# Patient Record
Sex: Female | Born: 1960 | Race: White | Hispanic: No | Smoking: Current every day smoker
Health system: Southern US, Community
[De-identification: ages and names within clinical notes are randomized; demographics above are authoritative.]

## PROBLEM LIST (undated history)

## (undated) DIAGNOSIS — I10 Essential (primary) hypertension: Secondary | ICD-10-CM

## (undated) DIAGNOSIS — C801 Malignant (primary) neoplasm, unspecified: Secondary | ICD-10-CM

## (undated) DIAGNOSIS — I639 Cerebral infarction, unspecified: Secondary | ICD-10-CM

## (undated) DIAGNOSIS — J449 Chronic obstructive pulmonary disease, unspecified: Secondary | ICD-10-CM

## (undated) DIAGNOSIS — E785 Hyperlipidemia, unspecified: Secondary | ICD-10-CM

## (undated) DIAGNOSIS — I6529 Occlusion and stenosis of unspecified carotid artery: Secondary | ICD-10-CM

## (undated) DIAGNOSIS — E039 Hypothyroidism, unspecified: Secondary | ICD-10-CM

## (undated) DIAGNOSIS — E119 Type 2 diabetes mellitus without complications: Secondary | ICD-10-CM

## (undated) DIAGNOSIS — E079 Disorder of thyroid, unspecified: Secondary | ICD-10-CM

## (undated) DIAGNOSIS — G43909 Migraine, unspecified, not intractable, without status migrainosus: Secondary | ICD-10-CM

## (undated) DIAGNOSIS — G4700 Insomnia, unspecified: Secondary | ICD-10-CM

## (undated) HISTORY — PX: BACK SURGERY: SHX140

## (undated) HISTORY — DX: Insomnia, unspecified: G47.00

## (undated) HISTORY — DX: Hyperlipidemia, unspecified: E78.5

## (undated) HISTORY — DX: Occlusion and stenosis of unspecified carotid artery: I65.29

## (undated) HISTORY — DX: Hypothyroidism, unspecified: E03.9

## (undated) HISTORY — DX: Migraine, unspecified, not intractable, without status migrainosus: G43.909

## (undated) HISTORY — PX: SPINAL CORD STIMULATOR IMPLANT: SHX2422

## (undated) HISTORY — PX: CAROTID ENDARTERECTOMY: SUR193

---
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
## 1990-04-29 HISTORY — PX: TOTAL THYROIDECTOMY: SHX2547

## 2000-03-08 ENCOUNTER — Encounter: Payer: Self-pay | Admitting: Neurosurgery

## 2000-03-08 ENCOUNTER — Ambulatory Visit (HOSPITAL_COMMUNITY): Admission: RE | Admit: 2000-03-08 | Discharge: 2000-03-08 | Payer: Self-pay | Admitting: Neurosurgery

## 2000-03-18 ENCOUNTER — Ambulatory Visit (HOSPITAL_COMMUNITY): Admission: RE | Admit: 2000-03-18 | Discharge: 2000-03-18 | Payer: Self-pay | Admitting: Neurosurgery

## 2000-03-18 ENCOUNTER — Encounter: Payer: Self-pay | Admitting: Neurosurgery

## 2000-04-01 ENCOUNTER — Ambulatory Visit (HOSPITAL_COMMUNITY): Admission: RE | Admit: 2000-04-01 | Discharge: 2000-04-01 | Payer: Self-pay | Admitting: Neurosurgery

## 2000-04-01 ENCOUNTER — Encounter: Payer: Self-pay | Admitting: Neurosurgery

## 2000-04-04 ENCOUNTER — Encounter: Admission: RE | Admit: 2000-04-04 | Discharge: 2000-04-04 | Payer: Self-pay | Admitting: Family Medicine

## 2000-04-04 ENCOUNTER — Encounter: Payer: Self-pay | Admitting: Family Medicine

## 2000-09-01 ENCOUNTER — Encounter: Payer: Self-pay | Admitting: Neurosurgery

## 2000-09-01 ENCOUNTER — Ambulatory Visit (HOSPITAL_COMMUNITY): Admission: RE | Admit: 2000-09-01 | Discharge: 2000-09-01 | Payer: Self-pay | Admitting: Neurosurgery

## 2001-07-21 ENCOUNTER — Other Ambulatory Visit: Admission: RE | Admit: 2001-07-21 | Discharge: 2001-07-21 | Payer: Self-pay | Admitting: Family Medicine

## 2001-10-15 ENCOUNTER — Encounter: Payer: Self-pay | Admitting: Neurosurgery

## 2001-10-15 ENCOUNTER — Encounter: Admission: RE | Admit: 2001-10-15 | Discharge: 2001-10-15 | Payer: Self-pay | Admitting: Neurosurgery

## 2001-10-29 ENCOUNTER — Encounter: Payer: Self-pay | Admitting: Neurosurgery

## 2001-10-29 ENCOUNTER — Encounter: Admission: RE | Admit: 2001-10-29 | Discharge: 2001-10-29 | Payer: Self-pay | Admitting: Neurosurgery

## 2001-11-12 ENCOUNTER — Encounter: Payer: Self-pay | Admitting: Neurosurgery

## 2001-11-12 ENCOUNTER — Encounter: Admission: RE | Admit: 2001-11-12 | Discharge: 2001-11-12 | Payer: Self-pay | Admitting: Neurosurgery

## 2003-09-07 ENCOUNTER — Other Ambulatory Visit: Admission: RE | Admit: 2003-09-07 | Discharge: 2003-09-07 | Payer: Self-pay | Admitting: Family Medicine

## 2005-04-29 HISTORY — PX: CHOLECYSTECTOMY: SHX55

## 2006-04-29 HISTORY — PX: OOPHORECTOMY: SHX86

## 2008-04-29 HISTORY — PX: BACK SURGERY: SHX140

## 2008-09-05 ENCOUNTER — Ambulatory Visit (HOSPITAL_COMMUNITY): Admission: RE | Admit: 2008-09-05 | Discharge: 2008-09-06 | Payer: Self-pay | Admitting: Neurosurgery

## 2009-03-01 ENCOUNTER — Ambulatory Visit (HOSPITAL_COMMUNITY): Admission: RE | Admit: 2009-03-01 | Discharge: 2009-03-02 | Payer: Self-pay | Admitting: Neurosurgery

## 2009-09-11 ENCOUNTER — Ambulatory Visit (HOSPITAL_COMMUNITY): Admission: RE | Admit: 2009-09-11 | Discharge: 2009-09-11 | Payer: Self-pay | Admitting: Anesthesiology

## 2009-10-17 ENCOUNTER — Inpatient Hospital Stay (HOSPITAL_COMMUNITY): Admission: RE | Admit: 2009-10-17 | Discharge: 2009-10-18 | Payer: Self-pay | Admitting: Orthopedic Surgery

## 2010-05-20 ENCOUNTER — Encounter: Payer: Self-pay | Admitting: Neurosurgery

## 2010-07-15 LAB — URINALYSIS, ROUTINE W REFLEX MICROSCOPIC
Bilirubin Urine: NEGATIVE
Glucose, UA: 250 mg/dL — AB
Hgb urine dipstick: NEGATIVE
Ketones, ur: NEGATIVE mg/dL
Nitrite: NEGATIVE
Protein, ur: NEGATIVE mg/dL
Specific Gravity, Urine: 1.027 (ref 1.005–1.030)
Urobilinogen, UA: 0.2 mg/dL (ref 0.0–1.0)
pH: 5 (ref 5.0–8.0)

## 2010-07-15 LAB — BASIC METABOLIC PANEL
BUN: 5 mg/dL — ABNORMAL LOW (ref 6–23)
CO2: 29 mEq/L (ref 19–32)
Calcium: 8.5 mg/dL (ref 8.4–10.5)
Chloride: 101 mEq/L (ref 96–112)
Creatinine, Ser: 0.79 mg/dL (ref 0.4–1.2)
GFR calc Af Amer: >60 mL/min (ref >60)
GFR calc non Af Amer: >60 mL/min (ref >60)
Glucose, Bld: 156 mg/dL — ABNORMAL HIGH (ref 70–99)
Potassium: 3.9 mEq/L (ref 3.5–5.1)
Sodium: 136 mEq/L (ref 135–145)

## 2010-07-15 LAB — DIFFERENTIAL
Basophils Absolute: 0.1 10*3/uL (ref 0.0–0.1)
Basophils Relative: 1 % (ref 0–1)
Eosinophils Absolute: 0.1 10*3/uL (ref 0.0–0.7)
Eosinophils Relative: 1 % (ref 0–5)
Lymphocytes Relative: 37 % (ref 12–46)
Lymphs Abs: 3.4 10*3/uL (ref 0.7–4.0)
Monocytes Absolute: 0.5 10*3/uL (ref 0.1–1.0)
Monocytes Relative: 6 % (ref 3–12)
Neutro Abs: 4.9 10*3/uL (ref 1.7–7.7)
Neutrophils Relative %: 55 % (ref 43–77)

## 2010-07-15 LAB — GLUCOSE, CAPILLARY
Glucose-Capillary: 115 mg/dL — ABNORMAL HIGH (ref 70–99)
Glucose-Capillary: 121 mg/dL — ABNORMAL HIGH (ref 70–99)
Glucose-Capillary: 144 mg/dL — ABNORMAL HIGH (ref 70–99)
Glucose-Capillary: 156 mg/dL — ABNORMAL HIGH (ref 70–99)
Glucose-Capillary: 173 mg/dL — ABNORMAL HIGH (ref 70–99)
Glucose-Capillary: 191 mg/dL — ABNORMAL HIGH (ref 70–99)

## 2010-07-15 LAB — COMPREHENSIVE METABOLIC PANEL
ALT: 36 U/L — ABNORMAL HIGH (ref 0–35)
AST: 26 U/L (ref 0–37)
Albumin: 4.4 g/dL (ref 3.5–5.2)
Alkaline Phosphatase: 43 U/L (ref 39–117)
BUN: 15 mg/dL (ref 6–23)
CO2: 25 mEq/L (ref 19–32)
Calcium: 9.7 mg/dL (ref 8.4–10.5)
Chloride: 103 mEq/L (ref 96–112)
Creatinine, Ser: 0.82 mg/dL (ref 0.4–1.2)
GFR calc Af Amer: >60 mL/min (ref >60)
GFR calc non Af Amer: >60 mL/min (ref >60)
Glucose, Bld: 258 mg/dL — ABNORMAL HIGH (ref 70–99)
Potassium: 4.3 mEq/L (ref 3.5–5.1)
Sodium: 138 mEq/L (ref 135–145)
Total Bilirubin: 0.3 mg/dL (ref 0.3–1.2)
Total Protein: 7.2 g/dL (ref 6.0–8.3)

## 2010-07-15 LAB — CBC
HCT: 34.8 % — ABNORMAL LOW (ref 36.0–46.0)
HCT: 40.3 % (ref 36.0–46.0)
Hemoglobin: 12.1 g/dL (ref 12.0–15.0)
Hemoglobin: 13.8 g/dL (ref 12.0–15.0)
MCHC: 34.3 g/dL (ref 30.0–36.0)
MCHC: 34.8 g/dL (ref 30.0–36.0)
MCV: 85.9 fL (ref 78.0–100.0)
MCV: 86.2 fL (ref 78.0–100.0)
Platelets: 188 10*3/uL (ref 150–400)
Platelets: 239 10*3/uL (ref 150–400)
RBC: 4.03 MIL/uL (ref 3.87–5.11)
RBC: 4.7 MIL/uL (ref 3.87–5.11)
RDW: 14.3 % (ref 11.5–15.5)
RDW: 14.7 % (ref 11.5–15.5)
WBC: 10.1 10*3/uL (ref 4.0–10.5)
WBC: 9 10*3/uL (ref 4.0–10.5)

## 2010-07-15 LAB — TYPE AND SCREEN
ABO/RH(D): A POS
Antibody Screen: NEGATIVE

## 2010-07-15 LAB — ABO/RH: ABO/RH(D): A POS

## 2010-07-15 LAB — APTT: aPTT: 27 seconds (ref 24–37)

## 2010-07-15 LAB — PROTIME-INR
INR: 0.92 (ref 0.00–1.49)
Prothrombin Time: 12.3 seconds (ref 11.6–15.2)

## 2010-08-01 LAB — GLUCOSE, CAPILLARY
Glucose-Capillary: 110 mg/dL — ABNORMAL HIGH (ref 70–99)
Glucose-Capillary: 120 mg/dL — ABNORMAL HIGH (ref 70–99)
Glucose-Capillary: 120 mg/dL — ABNORMAL HIGH (ref 70–99)
Glucose-Capillary: 143 mg/dL — ABNORMAL HIGH (ref 70–99)
Glucose-Capillary: 148 mg/dL — ABNORMAL HIGH (ref 70–99)
Glucose-Capillary: 150 mg/dL — ABNORMAL HIGH (ref 70–99)
Glucose-Capillary: 78 mg/dL (ref 70–99)

## 2010-08-02 LAB — BASIC METABOLIC PANEL
BUN: 7 mg/dL (ref 6–23)
CO2: 27 mEq/L (ref 19–32)
Calcium: 9.8 mg/dL (ref 8.4–10.5)
Chloride: 108 mEq/L (ref 96–112)
Creatinine, Ser: 0.72 mg/dL (ref 0.4–1.2)
GFR calc Af Amer: >60 mL/min (ref >60)
GFR calc non Af Amer: >60 mL/min (ref >60)
Glucose, Bld: 94 mg/dL (ref 70–99)
Potassium: 4.5 mEq/L (ref 3.5–5.1)
Sodium: 141 mEq/L (ref 135–145)

## 2010-08-02 LAB — CBC
HCT: 39 % (ref 36.0–46.0)
Hemoglobin: 13.5 g/dL (ref 12.0–15.0)
MCHC: 34.7 g/dL (ref 30.0–36.0)
MCV: 87.6 fL (ref 78.0–100.0)
Platelets: 291 10*3/uL (ref 150–400)
RBC: 4.45 MIL/uL (ref 3.87–5.11)
RDW: 14.7 % (ref 11.5–15.5)
WBC: 8.9 10*3/uL (ref 4.0–10.5)

## 2010-08-07 LAB — GLUCOSE, CAPILLARY
Glucose-Capillary: 120 mg/dL — ABNORMAL HIGH (ref 70–99)
Glucose-Capillary: 122 mg/dL — ABNORMAL HIGH (ref 70–99)
Glucose-Capillary: 132 mg/dL — ABNORMAL HIGH (ref 70–99)
Glucose-Capillary: 88 mg/dL (ref 70–99)
Glucose-Capillary: 92 mg/dL (ref 70–99)

## 2010-08-07 LAB — BASIC METABOLIC PANEL
BUN: 12 mg/dL (ref 6–23)
CO2: 28 mEq/L (ref 19–32)
Calcium: 10.7 mg/dL — ABNORMAL HIGH (ref 8.4–10.5)
Chloride: 103 mEq/L (ref 96–112)
Creatinine, Ser: 1.03 mg/dL (ref 0.4–1.2)
GFR calc Af Amer: >60 mL/min (ref >60)
GFR calc non Af Amer: 57 mL/min — ABNORMAL LOW (ref >60)
Glucose, Bld: 145 mg/dL — ABNORMAL HIGH (ref 70–99)
Potassium: 4.8 mEq/L (ref 3.5–5.1)
Sodium: 141 mEq/L (ref 135–145)

## 2010-08-07 LAB — CBC
HCT: 43.4 % (ref 36.0–46.0)
Hemoglobin: 15.1 g/dL — ABNORMAL HIGH (ref 12.0–15.0)
MCHC: 34.7 g/dL (ref 30.0–36.0)
MCV: 86.9 fL (ref 78.0–100.0)
Platelets: 331 10*3/uL (ref 150–400)
RBC: 4.99 MIL/uL (ref 3.87–5.11)
RDW: 15.3 % (ref 11.5–15.5)
WBC: 11.1 10*3/uL — ABNORMAL HIGH (ref 4.0–10.5)

## 2010-09-11 NOTE — Op Note (Signed)
NAMEADELFA, Terri Li               ACCOUNT NO.:  [id number]   MEDICAL RECORD NO.:  [id number]          PATIENT TYPE:  OIB   LOCATION:  3536                         FACILITY:  [facility]   PHYSICIAN:  Cristi Loron, M.D.DATE OF BIRTH:  08-Oct-1960   DATE OF PROCEDURE:  09/05/2008  DATE OF DISCHARGE:  09/06/2008                               OPERATIVE REPORT   BRIEF HISTORY:  The patient is a 50 year old white female who was  injured in a work-related accident.  She failed medical management, was  worked up with a lumbar MRI, which demonstrated the patient had  herniated disk at L5-S1.  The symptoms seemed consistent with a right S1  radiculopathy.  I discussed the various treatment options with the  patient including surgery.  She has weighed the risks, benefits, and  alternatives of surgery and desired to proceed with the right L5-S1  diskectomy.   PREOPERATIVE DIAGNOSES:  Right L5-S1 herniated nucleus pulposus,  lumbago, lumbar radiculopathy.   POSTOPERATIVE DIAGNOSES:  Right L5-S1 herniated nucleus pulposus,  lumbago, lumbar radiculopathy.   PROCEDURE:  Right L5-S1 diskectomy using microdissection.   SURGEON:  Cristi Loron, MD   ASSISTANT:  None.   ANESTHESIA:  General endotracheal.   ESTIMATED BLOOD LOSS:  25 mL.   SPECIMENS:  None.   DRAINS:  None.   COMPLICATIONS:  None.   DESCRIPTION OF THE PROCEDURE:  The patient was brought to the operating  room by the Anesthesia team.  General endotracheal anesthesia was  induced.  The patient was turned to the prone position on Wilson frame.  Her lumbosacral region was then prepared with Betadine scrub and  Betadine solution.  Sterile drapes were applied.  I then injected the  area to be incised with Marcaine with epinephrine solution.  I used a  scalpel to make a linear midline incision of the patient's L5-S1  interspace.  I used electrocautery to perform a right-sided  subperiosteal dissection exposing the right  spinous process and lamina  of L5 in upper sacrum.  I obtained an intraoperative radiograph to  confirm our location.  I then inserted the [facility] retractor for  exposure.  I then brought the operative microscope into the field and  under its magnification illumination, we completed the  microdissection/decompression.  I used a high-speed drill to perform a  right L5 laminotomy.  I widened the laminotomy with Kerrison punch  removing the ligamentum flavum at L5-S1 and performed a foraminotomy  about the right S1 nerve root.  I then used microdissection to free up  the thecal sac and the S1 nerve root from the epidural tissue.  I then  gently retracted the thecal sac and then nerve root medially with  D'Errico retractor.  We encountered a large focal central disk  protrusion as expected.  It was compressing the medial aspect of the  right S1 nerve root.  We removed this disk herniation in multiple  fragments using the pituitary forceps.  It was a bit calcified.  We then  encountered a small hole centrally at the annulus fibrosis.  There did  not appear  to be any impending herniations, so we therefore did not  enter into the intervertebral disk space.  We removed some spondylosis  from vertebral endplates using ossified tool further decompressing the  nerve roots.  I then palpated along the ventral surface of the thecal  sac along the exit route of the S1 nerve root and noted that neural  structures were well decompressed.  We obtained hemostasis using bipolar  electrocautery.  We irrigated the wound out with bacitracin solution.  We then removed the retractor and then reapproximated the patient's  thoracolumbar fascia with interrupted #1 Vicryl suture, subcutaneous  tissue with interrupted 2-0 Vicryl suture, and skin with Steri-Strips  and Benzoin.  The wound was then coated with bacitracin ointment.  Sterile dressing was applied.  The drapes were removed.  The patient was  subsequently  returned to supine position where she was extubated by the  Anesthesia team and transported to the postanesthesia care unit in  stable condition.  All sponge, instrument, and needle counts were  correct at the end of this case.      Cristi Loron, M.D.  Electronically Signed     JDJ/MEDQ  D:  09/06/2008  T:  09/07/2008  Job:  [id number]

## 2012-09-02 ENCOUNTER — Ambulatory Visit (HOSPITAL_COMMUNITY)
Admission: RE | Admit: 2012-09-02 | Discharge: 2012-09-02 | Disposition: A | Discharge status: Home / Self Care | Payer: Worker's Compensation | Source: Ambulatory Visit | Attending: Anesthesiology | Admitting: Anesthesiology

## 2012-09-02 ENCOUNTER — Other Ambulatory Visit (HOSPITAL_COMMUNITY): Payer: Self-pay | Admitting: Anesthesiology

## 2012-09-02 DIAGNOSIS — M25552 Pain in left hip: Secondary | ICD-10-CM

## 2012-09-02 DIAGNOSIS — M549 Dorsalgia, unspecified: Secondary | ICD-10-CM

## 2012-09-02 DIAGNOSIS — M47817 Spondylosis without myelopathy or radiculopathy, lumbosacral region: Secondary | ICD-10-CM | POA: Insufficient documentation

## 2012-09-02 DIAGNOSIS — M25559 Pain in unspecified hip: Secondary | ICD-10-CM | POA: Insufficient documentation

## 2012-09-02 DIAGNOSIS — M76899 Other specified enthesopathies of unspecified lower limb, excluding foot: Secondary | ICD-10-CM | POA: Insufficient documentation

## 2012-09-02 DIAGNOSIS — M5137 Other intervertebral disc degeneration, lumbosacral region: Secondary | ICD-10-CM | POA: Insufficient documentation

## 2012-09-02 DIAGNOSIS — M25551 Pain in right hip: Secondary | ICD-10-CM

## 2012-09-02 DIAGNOSIS — M51379 Other intervertebral disc degeneration, lumbosacral region without mention of lumbar back pain or lower extremity pain: Secondary | ICD-10-CM | POA: Insufficient documentation

## 2019-04-30 HISTORY — PX: CAROTID ENDARTERECTOMY: SUR193

## 2019-09-28 HISTORY — PX: CAROTID ENDARTERECTOMY: SUR193

## 2019-10-20 ENCOUNTER — Other Ambulatory Visit (HOSPITAL_COMMUNITY): Payer: Self-pay | Admitting: Surgery

## 2019-10-20 ENCOUNTER — Other Ambulatory Visit (HOSPITAL_COMMUNITY): Payer: Self-pay

## 2019-10-20 DIAGNOSIS — I771 Stricture of artery: Secondary | ICD-10-CM

## 2019-10-20 DIAGNOSIS — I6522 Occlusion and stenosis of left carotid artery: Secondary | ICD-10-CM

## 2019-11-22 ENCOUNTER — Ambulatory Visit (HOSPITAL_COMMUNITY)
Admission: RE | Admit: 2019-11-22 | Discharge: 2019-11-22 | Disposition: A | Discharge status: Home / Self Care | Payer: 59 | Source: Ambulatory Visit | Attending: Surgery | Admitting: Surgery

## 2019-11-22 ENCOUNTER — Other Ambulatory Visit: Payer: Self-pay

## 2019-11-22 DIAGNOSIS — I771 Stricture of artery: Secondary | ICD-10-CM

## 2019-11-22 DIAGNOSIS — I6522 Occlusion and stenosis of left carotid artery: Secondary | ICD-10-CM | POA: Insufficient documentation

## 2019-11-23 ENCOUNTER — Other Ambulatory Visit (HOSPITAL_COMMUNITY): Payer: Self-pay | Admitting: Surgery

## 2019-11-23 DIAGNOSIS — I6523 Occlusion and stenosis of bilateral carotid arteries: Secondary | ICD-10-CM

## 2019-11-25 ENCOUNTER — Encounter (HOSPITAL_COMMUNITY): Payer: Self-pay

## 2019-11-25 ENCOUNTER — Other Ambulatory Visit: Payer: Self-pay

## 2019-11-25 ENCOUNTER — Ambulatory Visit (HOSPITAL_COMMUNITY)
Admission: RE | Admit: 2019-11-25 | Discharge: 2019-11-25 | Disposition: A | Discharge status: Home / Self Care | Payer: 59 | Source: Ambulatory Visit | Attending: Surgery | Admitting: Surgery

## 2019-11-25 DIAGNOSIS — I6523 Occlusion and stenosis of bilateral carotid arteries: Secondary | ICD-10-CM | POA: Diagnosis present

## 2019-11-25 LAB — POCT I-STAT CREATININE: Creatinine, Ser: 0.9 mg/dL (ref 0.44–1.00)

## 2019-11-25 MED ORDER — IOHEXOL 350 MG/ML SOLN
100.0000 mL | Freq: Once | INTRAVENOUS | Status: AC | PRN
  Administered 2019-11-25: 100 mL via INTRAVENOUS

## 2019-11-25 MED ORDER — SODIUM CHLORIDE (PF) 0.9 % IJ SOLN
INTRAMUSCULAR | Status: AC
  Filled 2019-11-25: qty 50

## 2019-12-14 ENCOUNTER — Other Ambulatory Visit: Payer: Self-pay | Admitting: Orthopaedic Surgery

## 2019-12-14 DIAGNOSIS — M25512 Pain in left shoulder: Secondary | ICD-10-CM

## 2019-12-29 DIAGNOSIS — I639 Cerebral infarction, unspecified: Secondary | ICD-10-CM

## 2019-12-29 HISTORY — DX: Cerebral infarction, unspecified: I63.9

## 2020-01-02 ENCOUNTER — Ambulatory Visit
Admission: RE | Admit: 2020-01-02 | Discharge: 2020-01-02 | Disposition: A | Discharge status: Home / Self Care | Payer: 59 | Source: Ambulatory Visit | Attending: Orthopaedic Surgery | Admitting: Orthopaedic Surgery

## 2020-01-02 ENCOUNTER — Other Ambulatory Visit: Payer: Self-pay

## 2020-01-02 DIAGNOSIS — M25512 Pain in left shoulder: Secondary | ICD-10-CM

## 2020-01-04 ENCOUNTER — Other Ambulatory Visit: Payer: Self-pay | Admitting: Orthopaedic Surgery

## 2020-01-04 DIAGNOSIS — M25512 Pain in left shoulder: Secondary | ICD-10-CM

## 2020-01-12 ENCOUNTER — Ambulatory Visit
Admission: RE | Admit: 2020-01-12 | Discharge: 2020-01-12 | Disposition: A | Discharge status: Home / Self Care | Payer: 59 | Source: Ambulatory Visit | Attending: Orthopaedic Surgery | Admitting: Orthopaedic Surgery

## 2020-01-12 ENCOUNTER — Other Ambulatory Visit: Payer: Self-pay

## 2020-01-12 DIAGNOSIS — M25512 Pain in left shoulder: Secondary | ICD-10-CM

## 2020-01-12 MED ORDER — IOPAMIDOL (ISOVUE-M 200) INJECTION 41%
12.0000 mL | Freq: Once | INTRAMUSCULAR | Status: AC
  Administered 2020-01-12: 12 mL via INTRA_ARTICULAR

## 2020-02-04 ENCOUNTER — Emergency Department (HOSPITAL_COMMUNITY): Payer: 59

## 2020-02-04 ENCOUNTER — Other Ambulatory Visit: Payer: Self-pay

## 2020-02-04 ENCOUNTER — Encounter (HOSPITAL_COMMUNITY): Payer: Self-pay

## 2020-02-04 ENCOUNTER — Emergency Department (HOSPITAL_COMMUNITY)
Admission: EM | Admit: 2020-02-04 | Discharge: 2020-02-04 | Disposition: A | Discharge status: Home / Self Care | Payer: 59 | Attending: Emergency Medicine | Admitting: Emergency Medicine

## 2020-02-04 DIAGNOSIS — Z20822 Contact with and (suspected) exposure to covid-19: Secondary | ICD-10-CM | POA: Insufficient documentation

## 2020-02-04 DIAGNOSIS — E119 Type 2 diabetes mellitus without complications: Secondary | ICD-10-CM | POA: Insufficient documentation

## 2020-02-04 DIAGNOSIS — Z7984 Long term (current) use of oral hypoglycemic drugs: Secondary | ICD-10-CM | POA: Diagnosis not present

## 2020-02-04 DIAGNOSIS — R519 Headache, unspecified: Secondary | ICD-10-CM | POA: Diagnosis present

## 2020-02-04 DIAGNOSIS — Z79899 Other long term (current) drug therapy: Secondary | ICD-10-CM | POA: Diagnosis not present

## 2020-02-04 DIAGNOSIS — F17200 Nicotine dependence, unspecified, uncomplicated: Secondary | ICD-10-CM | POA: Diagnosis not present

## 2020-02-04 DIAGNOSIS — R569 Unspecified convulsions: Secondary | ICD-10-CM

## 2020-02-04 DIAGNOSIS — I10 Essential (primary) hypertension: Secondary | ICD-10-CM | POA: Diagnosis not present

## 2020-02-04 HISTORY — DX: Type 2 diabetes mellitus without complications: E11.9

## 2020-02-04 HISTORY — DX: Disorder of thyroid, unspecified: E07.9

## 2020-02-04 HISTORY — DX: Essential (primary) hypertension: I10

## 2020-02-04 HISTORY — DX: Unspecified convulsions: R56.9

## 2020-02-04 LAB — CBC WITH DIFFERENTIAL/PLATELET
Abs Immature Granulocytes: 0.01 10*3/uL (ref 0.00–0.07)
Basophils Absolute: 0 10*3/uL (ref 0.0–0.1)
Basophils Relative: 1 %
Eosinophils Absolute: 0.1 10*3/uL (ref 0.0–0.5)
Eosinophils Relative: 1 %
HCT: 40.8 % (ref 36.0–46.0)
Hemoglobin: 12.3 g/dL (ref 12.0–15.0)
Immature Granulocytes: 0 %
Lymphocytes Relative: 32 %
Lymphs Abs: 2.1 10*3/uL (ref 0.7–4.0)
MCH: 26.3 pg (ref 26.0–34.0)
MCHC: 30.1 g/dL (ref 30.0–36.0)
MCV: 87.4 fL (ref 80.0–100.0)
Monocytes Absolute: 0.5 10*3/uL (ref 0.1–1.0)
Monocytes Relative: 8 %
Neutro Abs: 3.8 10*3/uL (ref 1.7–7.7)
Neutrophils Relative %: 58 %
Platelets: 295 10*3/uL (ref 150–400)
RBC: 4.67 MIL/uL (ref 3.87–5.11)
RDW: 15.4 % (ref 11.5–15.5)
WBC: 6.5 10*3/uL (ref 4.0–10.5)
nRBC: 0 % (ref 0.0–0.2)

## 2020-02-04 LAB — BASIC METABOLIC PANEL
Anion gap: 11 (ref 5–15)
BUN: 10 mg/dL (ref 6–20)
CO2: 26 mmol/L (ref 22–32)
Calcium: 9.4 mg/dL (ref 8.9–10.3)
Chloride: 105 mmol/L (ref 98–111)
Creatinine, Ser: 0.85 mg/dL (ref 0.44–1.00)
GFR, Estimated: >60 mL/min (ref >60)
Glucose, Bld: 157 mg/dL — ABNORMAL HIGH (ref 70–99)
Potassium: 4.1 mmol/L (ref 3.5–5.1)
Sodium: 142 mmol/L (ref 135–145)

## 2020-02-04 LAB — RESPIRATORY PANEL BY RT PCR (FLU A&B, COVID)
Influenza A by PCR: NEGATIVE
Influenza B by PCR: NEGATIVE
SARS Coronavirus 2 by RT PCR: NEGATIVE

## 2020-02-04 MED ORDER — DIPHENHYDRAMINE HCL 50 MG/ML IJ SOLN
12.5000 mg | Freq: Once | INTRAMUSCULAR | Status: AC
  Administered 2020-02-04: 12.5 mg via INTRAVENOUS
  Filled 2020-02-04: qty 1

## 2020-02-04 MED ORDER — KETOROLAC TROMETHAMINE 15 MG/ML IJ SOLN
15.0000 mg | Freq: Once | INTRAMUSCULAR | Status: AC
  Administered 2020-02-04: 15 mg via INTRAVENOUS
  Filled 2020-02-04: qty 1

## 2020-02-04 MED ORDER — PROCHLORPERAZINE MALEATE 5 MG PO TABS
5.0000 mg | ORAL_TABLET | Freq: Three times a day (TID) | ORAL | 0 refills | Status: DC | PRN
Start: 2020-02-04 — End: 2020-03-11

## 2020-02-04 MED ORDER — ACETAMINOPHEN 500 MG PO TABS
1000.0000 mg | ORAL_TABLET | Freq: Once | ORAL | Status: AC
  Administered 2020-02-04: 1000 mg via ORAL
  Filled 2020-02-04: qty 2

## 2020-02-04 MED ORDER — SODIUM CHLORIDE 0.9 % IV BOLUS
500.0000 mL | Freq: Once | INTRAVENOUS | Status: AC
  Administered 2020-02-04: 500 mL via INTRAVENOUS

## 2020-02-04 MED ORDER — PROCHLORPERAZINE EDISYLATE 10 MG/2ML IJ SOLN
10.0000 mg | Freq: Once | INTRAMUSCULAR | Status: AC
  Administered 2020-02-04: 10 mg via INTRAVENOUS
  Filled 2020-02-04: qty 2

## 2020-02-04 MED ORDER — NAPROXEN 500 MG PO TABS
500.0000 mg | ORAL_TABLET | Freq: Two times a day (BID) | ORAL | 0 refills | Status: DC | PRN
Start: 2020-02-04 — End: 2020-03-11

## 2020-02-04 NOTE — Discharge Instructions (Signed)
Please follow-up with your primary doctor regarding your symptoms today.  If you have worsening headache, develop numbness, weakness, vision changes or other new concerning symptom, please return to ER for reassessment.  Take the prescribed medicine as needed for pain and nausea.  Additionally would recommend taking Tylenol.

## 2020-02-04 NOTE — ED Provider Notes (Addendum)
[facility]PART[facility] Provider Note   CSN: [id number] Arrival date & time: 02/04/20  1729     History No chief complaint on file.   Terri Li is a 59 y.o. female.  Past medical history diabetes, hypertension presents to ER with concern for severe headache.  Sudden onset headache around 1130-noon today.  Frontal, across headache, not worse on right or left, sharp, stabbing pains.  Headache worsened with moving eyes to left or right.  No blurry vision, no speech changes, numbness or weakness.  No neck pain, neck stiffness.   PMH DM HTN, followed by vascular for carotid artery, subclavian artery disease, carotid subclavian bypass. 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  Plaque at the proximal right ICA causes less than 50% stenosis.  Plaque slightly distal to the left ICA origin causes 50% stenosis.  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 Severe stenosis at the left vertebral origin.    HPI     Past Medical History:  Diagnosis Date  . Diabetes mellitus without complication ([place])   . Hypertension   . Thyroid disease     There are no problems to display for this patient.   Past Surgical History:  Procedure Laterality Date  . SPINAL CORD STIMULATOR IMPLANT       OB History   No obstetric history on file.     No family history on file.  Social History   Tobacco Use  . Smoking status: Current Every Day Smoker    Packs/day: 1.00  . Smokeless tobacco: Never Used  Substance Use Topics  . Alcohol use: Not Currently  . Drug use: Never    Home Medications Prior to Admission medications   Medication Sig Start Date End Date Taking? Authorizing Provider  atorvastatin (LIPITOR) 80 MG tablet Take 80 mg  by mouth every evening.  12/29/19  Yes [provider]  clopidogrel (PLAVIX) 75 MG tablet Take 75 mg by mouth daily. 01/09/20  Yes [provider]  Gabapentin, Once-Daily, (GRALISE) 600 MG TABS Take 1,800 mg by mouth at bedtime.   Yes [provider]  HUMALOG KWIKPEN 100 UNIT/ML KwikPen Inject 15-50 Units into the skin 3 (three) times daily. Inject 15 units BID then Inject 50 units at bedtime 01/21/20  Yes [provider]  JARDIANCE 25 MG TABS tablet Take 25 mg by mouth daily. 01/21/20  Yes [provider]  levothyroxine (SYNTHROID) 175 MCG tablet Take 175 mcg by mouth daily. 12/29/19  Yes [provider]  lisinopril (ZESTRIL) 10 MG tablet Take 10 mg by mouth daily.  12/14/19  Yes [provider]  metFORMIN (GLUCOPHAGE) 500 MG tablet Take 1,000-1,500 mg by mouth 2 (two) times daily with a meal. Take 2 tablet (1000 mg) and Take 3 tablets (1500 mg) at bedtime 01/25/20  Yes [provider]  Tapentadol HCl (NUCYNTA) 100 MG TABS Take 1 tablet by mouth in the morning and at bedtime.   Yes [provider]  TOUJEO SOLOSTAR 300 UNIT/ML Solostar Pen Inject 60 Units into the skin at bedtime.  01/21/20  Yes [provider]  zolpidem (AMBIEN) 10 MG tablet Take 10 mg by mouth at bedtime.   Yes [provider]  naproxen (NAPROSYN) 500 MG tablet Take 1 tablet (500 mg total) by mouth 2 (two) times daily as needed for  moderate pain or headache. 02/04/20   Lucrezia Starch, MD  prochlorperazine (COMPAZINE) 5 MG tablet Take 1 tablet (5 mg total) by mouth every 8 (eight) hours as needed for nausea or vomiting. 02/04/20   Lucrezia Starch, MD    Allergies    Hydrocodone  Review of Systems   Review of Systems  Constitutional: Negative for chills and fever.  HENT: Negative for ear pain and sore throat.   Eyes: Negative for pain and visual disturbance.  Respiratory: Negative for cough and shortness of breath.   Cardiovascular:  Negative for chest pain and palpitations.  Gastrointestinal: Negative for abdominal pain and vomiting.  Genitourinary: Negative for dysuria and hematuria.  Musculoskeletal: Negative for arthralgias and back pain.  Skin: Negative for color change and rash.  Neurological: Positive for headaches. Negative for seizures and syncope.  All other systems reviewed and are negative.   Physical Exam Updated Vital Signs BP 139/88   Pulse 77   Temp 98.1 F (36.7 C) (Oral)   Resp 19   Ht 5\' 7"  (1.702 m)   Wt 90.7 kg   SpO2 99%   BMI 31.32 kg/m   Physical Exam Vitals and nursing note reviewed.  Constitutional:      General: She is not in acute distress.    Appearance: She is well-developed.  HENT:     Head: Normocephalic and atraumatic.  Eyes:     Conjunctiva/sclera: Conjunctivae normal.     Comments: Normal EOM  Cardiovascular:     Rate and Rhythm: Normal rate and regular rhythm.     Heart sounds: No murmur heard.   Pulmonary:     Effort: Pulmonary effort is normal. No respiratory distress.     Breath sounds: Normal breath sounds.  Abdominal:     Palpations: Abdomen is soft.     Tenderness: There is no abdominal tenderness.  Musculoskeletal:     Cervical back: Neck supple.  Skin:    General: Skin is warm and dry.  Neurological:     Mental Status: She is alert.     Comments: AAOx3 CN 2-12 intact, speech clear visual fields intact Normal EOM, no nystagmus 5/5 strength in b/l UE and LE Sensation to light touch intact in b/l UE and LE Normal FNF Normal gait     ED Results / Procedures / Treatments   Labs (all labs ordered are listed, but only abnormal results are displayed) Labs Reviewed  BASIC METABOLIC PANEL - Abnormal; Notable for the following components:      Result Value   Glucose, Bld 157 (*)    All other components within normal limits  RESPIRATORY PANEL BY RT PCR (FLU A&B, COVID)  CBC WITH DIFFERENTIAL/PLATELET    EKG EKG  Interpretation  Date/Time:  Friday February 04 2020 17:38:50 EDT Ventricular Rate:  89 PR Interval:    QRS Duration: 111 QT Interval:  384 QTC Calculation: 468 R Axis:   -29 Text Interpretation: Sinus rhythm Prolonged PR interval Consider right atrial enlargement Borderline left axis deviation Anterior infarct, old Confirmed by Madalyn Rob [phone]) on 02/04/2020 5:39:40 PM   Radiology CT Head Wo Contrast  Result Date: 02/04/2020 CLINICAL DATA:  Severe headache EXAM: CT HEAD WITHOUT CONTRAST TECHNIQUE: Contiguous axial images were obtained from the base of the skull through the vertex without intravenous contrast. COMPARISON:  CT 11/25/2019 FINDINGS: Brain: No acute territorial infarction, hemorrhage or intracranial mass. 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 Stable ventricle size. Vascular: No hyperdense vessels.  No  unexpected calcification Skull: Normal. Negative for fracture or focal lesion. Sinuses/Orbits: No acute finding. Other: None IMPRESSION: 1. No CT evidence for acute intracranial abnormality. 2. 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 Electronically Signed   By: Donavan Foil M.D.   On: 02/04/2020 19:40    Procedures Procedures (including critical care time)  Medications Ordered in ED Medications  diphenhydrAMINE (BENADRYL) injection 12.5 mg (12.5 mg Intravenous Given 02/04/20 2024)  prochlorperazine (COMPAZINE) injection 10 mg (10 mg Intravenous Given 02/04/20 2025)  acetaminophen (TYLENOL) tablet 1,000 mg (1,000 mg Oral Given 02/04/20 2024)  ketorolac (TORADOL) 15 MG/ML injection 15 mg (15 mg Intravenous Given 02/04/20 2219)  sodium chloride 0.9 % bolus 500 mL (0 mLs Intravenous Stopped 02/04/20 2314)    ED Course  I have reviewed the triage vital signs and the nursing notes.  Pertinent labs & imaging results that were available during my care of the patient were reviewed by me and considered in my medical decision making (see chart for details).    MDM  Rules/Calculators/A&P                          59 year old lady presents to ER with severe sudden onset headache.  On physical exam, patient initially appeared somewhat uncomfortable but in no distress, normal neurologic exam.  CT head was negative for acute intracranial pathology, noted small old right cerebellar infarct.  No neck pain, neck stiffness, symptoms greatly improved after receiving headache cocktail.  Suspect symptoms related to tension type headache versus migraine.  Given symptoms well controlled, no neurologic symptoms, normal neurologic exam, believe patient can be discharged home and managed in the outpatient setting.  Reviewed return precautions with patient should she develop worsening headaches, neck pain, any neurologic complaints and recommended close follow-up with primary doctor.   After the discussed management above, the patient was determined to be safe for discharge.  The patient was in agreement with this plan and all questions regarding their care were answered.  ED return precautions were discussed and the patient will return to the ED with any significant worsening of condition.  Final Clinical Impression(s) / ED Diagnoses Final diagnoses:  Nonintractable headache, unspecified chronicity pattern, unspecified headache type    Rx / DC Orders ED Discharge Orders         Ordered    prochlorperazine (COMPAZINE) 5 MG tablet  Every 8 hours PRN        02/04/20 2303    naproxen (NAPROSYN) 500 MG tablet  2 times daily PRN        02/04/20 2303           Lucrezia Starch, MD 02/05/20 1623    Lucrezia Starch, MD 02/05/20 1623

## 2020-02-04 NOTE — ED Notes (Signed)
Patient transported to CT 

## 2020-02-04 NOTE — ED Notes (Signed)
Pt discharged with family, nadn, vss on ccm, pt given all prescription. No further questions asked at this time.

## 2020-02-04 NOTE — ED Triage Notes (Signed)
Pt from home via ems; 1130 sudden onset severe HA; beginning at 1230 pm, episode of eyes becoming fixed to the right; unsure if its when she turns head to right or just eyes to rightt; able to follow commands during episode; happens approximately every 20 minutes; stroke screen negative; cbg 155, hx dm; 10/10 frontal HA , 180/102, hx of htn, takes bp meds as prescribed, 98% RA, HR 90, 12 lead unremarkable; no hx of same; pt a and o x 4; pt wears glasses normally, not on now, vision blurred, which she says is baseline w/o her glasses

## 2020-02-28 NOTE — Progress Notes (Signed)
ASSESSMENT & PLAN:  59 y.o. female with 5-7 episodic seizures associated with headache and spontaneous movement of head and right upper extremity (02/04/20). Recent history of left carotid subclavian bypass done in [place] for ? Claudication of left upper extremity. ICA stenosis of ~50% seen bilaterally on CTA done 11/25/19 in follow up of LCCA-LSCA bypass.   Her symptoms do not seem consistent with symptomatic carotid artery stenosis. Have asked her to keep her appointment with neurology.  Recommend:  Complete cessation from all tobacco products. Blood glucose control with goal A1c < 7%. Blood pressure control with goal blood pressure < 140/90 mmHg. Lipid reduction therapy with goal LDL-C <100 mg/dL (<70 if symptomatic from PAD).  Aspirin 81mg  PO QD.  Clopidogrel 75mg  PO QD. Atorvastatin 40-80mg  PO QD (or other "high intensity" statin therapy).  Follow up with me after neurology evaluation to discuss their findings.  CHIEF COMPLAINT:   Episodic seizures  HISTORY:  HISTORY OF PRESENT ILLNESS: Terri Li is a 59 y.o. female with a complicated recent medical history.  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  This was done in [place].  She was in [place] taking care of her mother while she was in the final stages of her life.  She has since returned to [facility] and is looking to establish care with a vascular surgeon.  On February 04, 2020 she began to develop episodic seizure-like activity.  She reports the seizures will occur out of the blue.  She has involuntary movement of her head and right upper extremity.  These are associated with severe headache.  She has been taking high doses of Tylenol daily to try to treat her headache.  She was seen by her primary care physician October 12 who then referred the patient to myself and the neurology team.  Past Medical History:  Diagnosis Date  . Diabetes mellitus without complication  ([place])   . Hypertension   . Thyroid disease     Past Surgical History:  Procedure Laterality Date  . SPINAL CORD STIMULATOR IMPLANT     Left carotid subclavian bypass  No family history on file.  Social History   Socioeconomic History  . Marital status: Married    Spouse name: Not on file  . Number of children: Not on file  . Years of education: Not on file  . Highest education level: Not on file  Occupational History  . Not on file  Tobacco Use  . Smoking status: Current Every Day Smoker    Packs/day: 1.00  . Smokeless tobacco: Never Used  Substance and Sexual Activity  . Alcohol use: Not Currently  . Drug use: Never  . Sexual activity: Not on file  Other Topics Concern  . Not on file  Social History Narrative  . Not on file   Social Determinants of Health   Financial Resource Strain:   . Difficulty of Paying Living Expenses: Not on file  Food Insecurity:   . Worried About Charity fundraiser in the Last Year: Not on file  . Ran Out of Food in the Last Year: Not on file  Transportation Needs:   . Lack of Transportation (Medical): Not on file  . Lack of Transportation (Non-Medical): Not on file  Physical Activity:   . Days of Exercise per Week: Not on file  . Minutes of Exercise per Session: Not on file  Stress:   . Feeling of Stress : Not on file  Social Connections:   . Frequency of Communication with Friends and Family: Not on file  . Frequency of Social Gatherings with Friends and Family: Not on file  . Attends Religious Services: Not on file  . Active Member of Clubs or Organizations: Not on file  . Attends Archivist Meetings: Not on file  . Marital Status: Not on file  Intimate Partner Violence:   . Fear of Current or Ex-Partner: Not on file  . Emotionally Abused: Not on file  . Physically Abused: Not on file  . Sexually Abused: Not on file    Allergies  Allergen Reactions  . Hydrocodone Itching    Current Outpatient Medications   Medication Sig Dispense Refill  . atorvastatin (LIPITOR) 80 MG tablet Take 80 mg by mouth every evening.     . clopidogrel (PLAVIX) 75 MG tablet Take 75 mg by mouth daily.    . Gabapentin, Once-Daily, (GRALISE) 600 MG TABS Take 1,800 mg by mouth at bedtime.    Marland Kitchen HUMALOG KWIKPEN 100 UNIT/ML KwikPen Inject 15-50 Units into the skin 3 (three) times daily. Inject 15 units BID then Inject 50 units at bedtime    . JARDIANCE 25 MG TABS tablet Take 25 mg by mouth daily.    Marland Kitchen levothyroxine (SYNTHROID) 175 MCG tablet Take 175 mcg by mouth daily.    Marland Kitchen lisinopril (ZESTRIL) 10 MG tablet Take 10 mg by mouth daily.     . metFORMIN (GLUCOPHAGE) 500 MG tablet Take 1,000-1,500 mg by mouth 2 (two) times daily with a meal. Take 2 tablet (1000 mg) and Take 3 tablets (1500 mg) at bedtime    . naproxen (NAPROSYN) 500 MG tablet Take 1 tablet (500 mg total) by mouth 2 (two) times daily as needed for moderate pain or headache. 30 tablet 0  . prochlorperazine (COMPAZINE) 5 MG tablet Take 1 tablet (5 mg total) by mouth every 8 (eight) hours as needed for nausea or vomiting. 20 tablet 0  . Tapentadol HCl (NUCYNTA) 100 MG TABS Take 1 tablet by mouth in the morning and at bedtime.    Nelva Nay SOLOSTAR 300 UNIT/ML Solostar Pen Inject 60 Units into the skin at bedtime.     Marland Kitchen zolpidem (AMBIEN) 10 MG tablet Take 10 mg by mouth at bedtime.     No current facility-administered medications for this visit.    REVIEW OF SYSTEMS:  [X]  denotes positive finding, [ ]  denotes negative finding Cardiac  Comments:  Chest pain or chest pressure:    Shortness of breath upon exertion: x   Short of breath when lying flat:    Irregular heart rhythm: x       Vascular    Pain in calf, thigh, or hip brought on by ambulation: x   Pain in feet at night that wakes you up from your sleep:  x   Blood clot in your veins:    Leg swelling:         Pulmonary    Oxygen at home:    Productive cough:  x   Wheezing:  x       Neurologic     Sudden weakness in arms or legs:  x   Sudden numbness in arms or legs:  x   Sudden onset of difficulty speaking or slurred speech: x   Temporary loss of vision in one eye:  x   Problems with dizziness:  x       Gastrointestinal    Blood in stool:  Vomited blood:         Genitourinary    Burning when urinating:     Blood in urine:        Psychiatric    Major depression:         Hematologic    Bleeding problems: x   Problems with blood clotting too easily:        Skin    Rashes or ulcers:        Constitutional    Fever or chills:     PHYSICAL EXAM:   Vitals:   02/29/20 0930  BP: (!) 163/87  Pulse: 77  Temp: 97.9 F (36.6 C)  TempSrc: Skin  SpO2: 99%  Weight: 188 lb 3.2 oz (85.4 kg)  Height: 5\' 7"  (1.702 m)    Constitutional: Chronically ill appearing in no distress. Appears well nourished.  Neurologic: Normal gait and station. CN intact.  No weakness.  No sensory loss. Psychiatric: Mood and affect symmetric and appropriate. Eyes: No icterus.  No conjunctival pallor. Ears, nose, throat: mucous membranes moist.  Midline trachea.  No carotid bruit.  Chevron incision over the left neck consistent with carotid subclavian bypass. Cardiac: Regular rate and rhythm.  No murmurs / gallops / rubs. Respiratory: Clear to auscultation bilaterally.  No wheezes / rales / rhonchi. Abdominal: Soft, non-tender, non-distended.  Peripheral vascular:  Radial pulse: L 2+ / R 2+  Dorsalis pedis pulse: L 2+ / R 2+ Extremity: No edema. No cyanosis. No pallor.  Skin: No gangrene. No ulceration.  Lymphatic: No Stemmer's sign. No palpable lymphadenopathy.  DATA REVIEW:    Most recent CBC CBC Latest Ref Rng & Units 02/04/2020 10/18/2009 10/13/2009  WBC 4.0 - 10.5 K/uL 6.5 10.1 9.0  Hemoglobin 12.0 - 15.0 g/dL 12.3 12.1 13.8  Hematocrit 36 - 46 % 40.8 34.8(L) 40.3  Platelets 150 - 400 K/uL 295 188 239     Most recent CMP CMP Latest Ref Rng & Units 02/04/2020 11/25/2019 10/18/2009   Glucose 70 - 99 mg/dL 157(H) - 156(H)  BUN 6 - 20 mg/dL 10 - 5(L)  Creatinine 0.44 - 1.00 mg/dL 0.85 0.90 0.79  Sodium 135 - 145 mmol/L 142 - 136  Potassium 3.5 - 5.1 mmol/L 4.1 - 3.9  Chloride 98 - 111 mmol/L 105 - 101  CO2 22 - 32 mmol/L 26 - 29  Calcium 8.9 - 10.3 mg/dL 9.4 - 8.5  Total Protein 6.0 - 8.3 g/dL - - -  Total Bilirubin 0.3 - 1.2 mg/dL - - -  Alkaline Phos 39 - 117 U/L - - -  AST 0 - 37 U/L - - -  ALT 0 - 35 U/L - - -    Renal function CrCl cannot be calculated (Patient's most recent lab result is older than the maximum 21 days allowed.).  No results found for: HGBA1C  No results found for: LDLCALC, LDLC, HIRISKLDL, POCLDL, LDLDIRECT, REALLDLC, TOTLDLC   EXAM: CT ANGIOGRAPHY HEAD AND NECK  TECHNIQUE: Multidetector CT imaging of the head and neck was performed using the standard protocol during bolus administration of intravenous contrast. Multiplanar CT image reconstructions and MIPs were obtained to evaluate the vascular anatomy. Carotid stenosis measurements (when applicable) are obtained utilizing NASCET criteria, using the distal internal carotid diameter as the denominator.  CONTRAST:  177mL OMNIPAQUE IOHEXOL 350 MG/ML SOLN  COMPARISON:  None.  FINDINGS: CT HEAD  Brain: There is no acute intracranial hemorrhage, mass effect, or edema. Gray-white differentiation is preserved. There is no extra-axial  fluid collection. Small age-indeterminate infarction of the right cerebellum. Ventricles and sulci are within normal limits in size and configuration.  Vascular: No hyperdense vessel or unexpected calcification.  Skull: Calvarium is unremarkable.  Sinuses/Orbits: No acute finding.  Other: Patchy right mastoid opacification.  Review of the MIP images confirms the above findings  CTA NECK  Aortic arch: There is calcified and noncalcified plaque at the great vessel origins. Severe stenosis of the left subclavian origin with partial  reconstitution secondary to retrograde flow via common carotid-subclavian graft. There is at least mild stenosis at the innominate origin. At least mild stenosis at the right subclavian origin. There is suboptimal evaluation due to significant streak artifact.  Right carotid system: Common carotid is patent with multifocal calcified plaque. There is calcified plaque at the bifurcation and along the proximal ICA causing less than 50% stenosis. Remainder of cervical ICA is patent. External carotid is patent.  Left carotid system: Probable moderate stenosis at the common carotid origin. Atherosclerotic wall thickening along the common carotid with mild calcified plaque also present. Possible carotid endarterectomy. There is noncalcified plaque along the proximal ICA, greatest approximately 1.5 cm from the origin causing approximately 50% stenosis. Remainder of the cervical IC is patent. There is severe stenosis at the ECA origin.  Vertebral arteries: Left vertebral artery is patent with severe stenosis at the origin. Minimal opacification of the right vertebral artery, which is occluded at the origin. There is collateral reconstitution at the level of the V3 segment.  Skeleton: Multilevel degenerative changes of the cervical spine.  Other neck: No mass or adenopathy.  Upper chest: Left apical atelectasis or scarring.  Review of the MIP images confirms the above findings  CTA HEAD  Anterior circulation: Intracranial internal carotid arteries are patent. Anterior cerebral arteries are patent. Right A1 ACA is dominant. Middle cerebral arteries are patent.  Posterior circulation: Intracranial vertebral arteries, basilar artery, and posterior cerebral arteries are patent. There are bilateral posterior communicating arteries.  Venous sinuses: Patent as allowed by contrast bolus timing.  Review of the MIP images confirms the above findings  IMPRESSION: No acute  intracranial abnormality. 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  Plaque at the proximal right ICA causes less than 50% stenosis. Plaque slightly distal to the left ICA origin causes 50% stenosis.  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 Severe stenosis at the left vertebral origin.   Electronically Signed   By: Macy Mis M.D.   On: 11/25/2019 10:01   Yevonne Aline. Stanford Breed, MD [facility] Phone Number: [phone] 02/28/2020 7:51 PM

## 2020-02-29 ENCOUNTER — Other Ambulatory Visit: Payer: Self-pay

## 2020-02-29 ENCOUNTER — Encounter: Payer: Self-pay | Admitting: Vascular Surgery

## 2020-02-29 ENCOUNTER — Ambulatory Visit (INDEPENDENT_AMBULATORY_CARE_PROVIDER_SITE_OTHER): Payer: 59 | Admitting: Vascular Surgery

## 2020-02-29 VITALS — BP 163/87 | HR 77 | Temp 97.9°F | Ht 67.0 in | Wt 188.2 lb

## 2020-02-29 DIAGNOSIS — I6523 Occlusion and stenosis of bilateral carotid arteries: Secondary | ICD-10-CM | POA: Diagnosis not present

## 2020-02-29 DIAGNOSIS — I708 Atherosclerosis of other arteries: Secondary | ICD-10-CM

## 2020-03-01 ENCOUNTER — Other Ambulatory Visit: Payer: Self-pay

## 2020-03-01 ENCOUNTER — Other Ambulatory Visit: Payer: Self-pay | Admitting: *Deleted

## 2020-03-01 DIAGNOSIS — I6523 Occlusion and stenosis of bilateral carotid arteries: Secondary | ICD-10-CM

## 2020-03-07 ENCOUNTER — Emergency Department (HOSPITAL_BASED_OUTPATIENT_CLINIC_OR_DEPARTMENT_OTHER): Payer: 59

## 2020-03-07 ENCOUNTER — Encounter (HOSPITAL_BASED_OUTPATIENT_CLINIC_OR_DEPARTMENT_OTHER): Payer: Self-pay | Admitting: *Deleted

## 2020-03-07 ENCOUNTER — Encounter: Payer: Self-pay | Admitting: Vascular Surgery

## 2020-03-07 ENCOUNTER — Inpatient Hospital Stay (HOSPITAL_BASED_OUTPATIENT_CLINIC_OR_DEPARTMENT_OTHER)
Admission: EM | Admit: 2020-03-07 | Discharge: 2020-03-11 | DRG: 184 | Disposition: A | Discharge status: Home Health | Payer: 59 | Attending: Surgery | Admitting: Surgery

## 2020-03-07 ENCOUNTER — Other Ambulatory Visit: Payer: Self-pay

## 2020-03-07 DIAGNOSIS — S2243XA Multiple fractures of ribs, bilateral, initial encounter for closed fracture: Principal | ICD-10-CM | POA: Diagnosis present

## 2020-03-07 DIAGNOSIS — Z79899 Other long term (current) drug therapy: Secondary | ICD-10-CM

## 2020-03-07 DIAGNOSIS — Z794 Long term (current) use of insulin: Secondary | ICD-10-CM

## 2020-03-07 DIAGNOSIS — S0001XA Abrasion of scalp, initial encounter: Secondary | ICD-10-CM | POA: Diagnosis present

## 2020-03-07 DIAGNOSIS — Z7902 Long term (current) use of antithrombotics/antiplatelets: Secondary | ICD-10-CM

## 2020-03-07 DIAGNOSIS — S12590A Other displaced fracture of sixth cervical vertebra, initial encounter for closed fracture: Secondary | ICD-10-CM | POA: Diagnosis present

## 2020-03-07 DIAGNOSIS — Z885 Allergy status to narcotic agent status: Secondary | ICD-10-CM

## 2020-03-07 DIAGNOSIS — I10 Essential (primary) hypertension: Secondary | ICD-10-CM | POA: Diagnosis present

## 2020-03-07 DIAGNOSIS — W109XXA Fall (on) (from) unspecified stairs and steps, initial encounter: Secondary | ICD-10-CM | POA: Diagnosis present

## 2020-03-07 DIAGNOSIS — S22028A Other fracture of second thoracic vertebra, initial encounter for closed fracture: Secondary | ICD-10-CM | POA: Diagnosis present

## 2020-03-07 DIAGNOSIS — J449 Chronic obstructive pulmonary disease, unspecified: Secondary | ICD-10-CM | POA: Diagnosis present

## 2020-03-07 DIAGNOSIS — S12690A Other displaced fracture of seventh cervical vertebra, initial encounter for closed fracture: Secondary | ICD-10-CM | POA: Diagnosis present

## 2020-03-07 DIAGNOSIS — F17210 Nicotine dependence, cigarettes, uncomplicated: Secondary | ICD-10-CM | POA: Diagnosis present

## 2020-03-07 DIAGNOSIS — S129XXA Fracture of neck, unspecified, initial encounter: Secondary | ICD-10-CM

## 2020-03-07 DIAGNOSIS — E119 Type 2 diabetes mellitus without complications: Secondary | ICD-10-CM | POA: Diagnosis present

## 2020-03-07 DIAGNOSIS — Z7984 Long term (current) use of oral hypoglycemic drugs: Secondary | ICD-10-CM

## 2020-03-07 DIAGNOSIS — Y92009 Unspecified place in unspecified non-institutional (private) residence as the place of occurrence of the external cause: Secondary | ICD-10-CM

## 2020-03-07 DIAGNOSIS — S42032A Displaced fracture of lateral end of left clavicle, initial encounter for closed fracture: Secondary | ICD-10-CM | POA: Diagnosis present

## 2020-03-07 DIAGNOSIS — W19XXXA Unspecified fall, initial encounter: Secondary | ICD-10-CM

## 2020-03-07 DIAGNOSIS — E039 Hypothyroidism, unspecified: Secondary | ICD-10-CM | POA: Diagnosis present

## 2020-03-07 DIAGNOSIS — T1490XA Injury, unspecified, initial encounter: Secondary | ICD-10-CM | POA: Diagnosis not present

## 2020-03-07 DIAGNOSIS — S5011XA Contusion of right forearm, initial encounter: Secondary | ICD-10-CM | POA: Diagnosis present

## 2020-03-07 DIAGNOSIS — E785 Hyperlipidemia, unspecified: Secondary | ICD-10-CM | POA: Diagnosis present

## 2020-03-07 DIAGNOSIS — S12490A Other displaced fracture of fifth cervical vertebra, initial encounter for closed fracture: Secondary | ICD-10-CM | POA: Diagnosis present

## 2020-03-07 DIAGNOSIS — Z79890 Hormone replacement therapy (postmenopausal): Secondary | ICD-10-CM

## 2020-03-07 DIAGNOSIS — Z20822 Contact with and (suspected) exposure to covid-19: Secondary | ICD-10-CM | POA: Diagnosis present

## 2020-03-07 DIAGNOSIS — S22018A Other fracture of first thoracic vertebra, initial encounter for closed fracture: Secondary | ICD-10-CM | POA: Diagnosis present

## 2020-03-07 DIAGNOSIS — S2249XA Multiple fractures of ribs, unspecified side, initial encounter for closed fracture: Secondary | ICD-10-CM | POA: Diagnosis present

## 2020-03-07 HISTORY — DX: Chronic obstructive pulmonary disease, unspecified: J44.9

## 2020-03-07 LAB — CBG MONITORING, ED
Glucose-Capillary: 69 mg/dL — ABNORMAL LOW (ref 70–99)
Glucose-Capillary: 88 mg/dL (ref 70–99)

## 2020-03-07 LAB — CBC WITH DIFFERENTIAL/PLATELET
Abs Immature Granulocytes: 0.04 10*3/uL (ref 0.00–0.07)
Basophils Absolute: 0 10*3/uL (ref 0.0–0.1)
Basophils Relative: 0 %
Eosinophils Absolute: 0.1 10*3/uL (ref 0.0–0.5)
Eosinophils Relative: 1 %
HCT: 39.9 % (ref 36.0–46.0)
Hemoglobin: 12.7 g/dL (ref 12.0–15.0)
Immature Granulocytes: 0 %
Lymphocytes Relative: 23 %
Lymphs Abs: 2.5 10*3/uL (ref 0.7–4.0)
MCH: 27.3 pg (ref 26.0–34.0)
MCHC: 31.8 g/dL (ref 30.0–36.0)
MCV: 85.6 fL (ref 80.0–100.0)
Monocytes Absolute: 0.8 10*3/uL (ref 0.1–1.0)
Monocytes Relative: 7 %
Neutro Abs: 7.2 10*3/uL (ref 1.7–7.7)
Neutrophils Relative %: 69 %
Platelets: 340 10*3/uL (ref 150–400)
RBC: 4.66 MIL/uL (ref 3.87–5.11)
RDW: 16.8 % — ABNORMAL HIGH (ref 11.5–15.5)
WBC: 10.7 10*3/uL — ABNORMAL HIGH (ref 4.0–10.5)
nRBC: 0 % (ref 0.0–0.2)

## 2020-03-07 LAB — COMPREHENSIVE METABOLIC PANEL
ALT: 25 U/L (ref 0–44)
AST: 31 U/L (ref 15–41)
Albumin: 4.5 g/dL (ref 3.5–5.0)
Alkaline Phosphatase: 45 U/L (ref 38–126)
Anion gap: 12 (ref 5–15)
BUN: 16 mg/dL (ref 6–20)
CO2: 25 mmol/L (ref 22–32)
Calcium: 9.3 mg/dL (ref 8.9–10.3)
Chloride: 99 mmol/L (ref 98–111)
Creatinine, Ser: 0.9 mg/dL (ref 0.44–1.00)
GFR, Estimated: >60 mL/min (ref >60)
Glucose, Bld: 57 mg/dL — ABNORMAL LOW (ref 70–99)
Potassium: 3.7 mmol/L (ref 3.5–5.1)
Sodium: 136 mmol/L (ref 135–145)
Total Bilirubin: 0.5 mg/dL (ref 0.3–1.2)
Total Protein: 7.8 g/dL (ref 6.5–8.1)

## 2020-03-07 MED ORDER — IOHEXOL 300 MG/ML  SOLN
100.0000 mL | Freq: Once | INTRAMUSCULAR | Status: AC | PRN
  Administered 2020-03-07: 100 mL via INTRAVENOUS

## 2020-03-07 MED ORDER — FENTANYL CITRATE (PF) 100 MCG/2ML IJ SOLN
50.0000 ug | Freq: Once | INTRAMUSCULAR | Status: AC
  Administered 2020-03-07: 50 ug via INTRAVENOUS
  Filled 2020-03-07: qty 2

## 2020-03-07 MED ORDER — FENTANYL CITRATE (PF) 100 MCG/2ML IJ SOLN
INTRAMUSCULAR | Status: AC
  Filled 2020-03-07: qty 2

## 2020-03-07 MED ORDER — ONDANSETRON HCL 4 MG/2ML IJ SOLN
4.0000 mg | Freq: Once | INTRAMUSCULAR | Status: AC
  Administered 2020-03-07: 4 mg via INTRAVENOUS
  Filled 2020-03-07: qty 2

## 2020-03-07 MED ORDER — FENTANYL CITRATE (PF) 100 MCG/2ML IJ SOLN
100.0000 ug | Freq: Once | INTRAMUSCULAR | Status: AC
  Administered 2020-03-07: 100 ug via INTRAVENOUS

## 2020-03-07 NOTE — ED Triage Notes (Addendum)
Pt c/o fall down 5 steps , head injury with lac., left chest and shoulder pain  No LOC , pt is on blood thinners

## 2020-03-07 NOTE — Progress Notes (Signed)
Letter sent to patient.

## 2020-03-07 NOTE — ED Notes (Signed)
MD aware of pt and pt moved to rm 5

## 2020-03-07 NOTE — ED Notes (Signed)
CBG 69mg /dl - EDP informed

## 2020-03-07 NOTE — ED Notes (Signed)
States she fell off deck and down approx 8 steps A - airway intact, strong cough, speech wnl, swallowing wnl B - resp even and non labored, clear BS C- good capillary refill, CMS wnl, strong pulses D - GCS 15 E - placed in pt gown F - VS stable at this time G - Lab work obtained, IV established, placed on cont pox monitor with freq NBP assessments, pain med given, positioned for comfort as well H - has multiple abrassions and bruised areas, bleeding is controlled, wound care being provide

## 2020-03-07 NOTE — ED Provider Notes (Signed)
[place] EMERGENCY DEPARTMENT Provider Note   CSN: [id number] Arrival date & time: 03/07/20  1938     History Chief Complaint  Patient presents with  . Fall    Terri Li is a 59 y.o. female.  The history is provided by the patient and medical records.  Fall   Terri Li is a 59 y.o. female who presents to the Emergency Department complaining of fall.  She presents to the ED for evaluation of injuries following a fall that occurred just prior to ED arrival.  She was standing on a deck and turned, missing a step and fell down about 8 steps.  No LOC.  She has pain to the right neck, left shoulder, left ribs, face, right hip.  Hurts to breathe.  Hurts some in the right hip with standing.  Takes plavix.  Started having seizures on October 8, not on seizure meds, no seizures for the last three weeks.      Past Medical History:  Diagnosis Date  . Diabetes mellitus without complication ([place])   . Hypertension   . Thyroid disease     Patient Active Problem List   Diagnosis Date Noted  . Multiple rib fractures 03/08/2020    Past Surgical History:  Procedure Laterality Date  . BACK SURGERY    . CAROTID ENDARTERECTOMY    . SPINAL CORD STIMULATOR IMPLANT       OB History   No obstetric history on file.     No family history on file.  Social History   Tobacco Use  . Smoking status: Current Every Day Smoker    Packs/day: 1.00  . Smokeless tobacco: Never Used  Substance Use Topics  . Alcohol use: Not Currently  . Drug use: Never    Home Medications Prior to Admission medications   Medication Sig Start Date End Date Taking? Authorizing Provider  atorvastatin (LIPITOR) 80 MG tablet Take 80 mg by mouth every evening.  12/29/19   [provider]  clopidogrel (PLAVIX) 75 MG tablet Take 75 mg by mouth daily. 01/09/20   [provider]  Gabapentin, Once-Daily, (GRALISE) 600 MG TABS Take 1,800 mg by mouth at bedtime.    [provider]  HUMALOG KWIKPEN 100 UNIT/ML KwikPen Inject 15-50 Units into the skin 3 (three) times daily. Inject 15 units BID then Inject 50 units at bedtime 01/21/20   [provider]  JARDIANCE 25 MG TABS tablet Take 25 mg by mouth daily. Patient not taking: Reported on 02/29/2020 01/21/20   [provider]  levothyroxine (SYNTHROID) 175 MCG tablet Take 175 mcg by mouth daily. 12/29/19   [provider]  lisinopril (ZESTRIL) 10 MG tablet Take 10 mg by mouth daily.  12/14/19   [provider]  metFORMIN (GLUCOPHAGE) 500 MG tablet Take 1,000-1,500 mg by mouth 2 (two) times daily with a meal. Take 2 tablet (1000 mg) and Take 3 tablets (1500 mg) at bedtime 01/25/20   [provider]  naproxen (NAPROSYN) 500 MG tablet Take 1 tablet (500 mg total) by mouth 2 (two) times daily as needed for moderate pain or headache. Patient not taking: Reported on 02/29/2020 02/04/20   Lucrezia Starch, MD  prochlorperazine (COMPAZINE) 5 MG tablet Take 1 tablet (5 mg total) by mouth every 8 (eight) hours as needed for nausea or vomiting. Patient not taking: Reported on 02/29/2020 02/04/20   Lucrezia Starch, MD  Tapentadol HCl (NUCYNTA) 100 MG TABS Take 1 tablet by mouth  in the morning and at bedtime.    [provider]  TOUJEO SOLOSTAR 300 UNIT/ML Solostar Pen Inject 60 Units into the skin at bedtime.  01/21/20   [provider]  zolpidem (AMBIEN) 10 MG tablet Take 10 mg by mouth at bedtime.    [provider]    Allergies    Hydrocodone and Codeine  Review of Systems   Review of Systems  All other systems reviewed and are negative.   Physical Exam Updated Vital Signs BP 104/72 (BP Location: Left Arm)   Pulse 100   Temp 98 F (36.7 C) (Oral)   Resp (!) 22   Ht 5\' 7"  (1.702 m)   Wt 86.2 kg   SpO2 100%   BMI 29.76 kg/m   Physical Exam Vitals and nursing note reviewed.  Constitutional:      Appearance: She is well-developed.  HENT:       Head: Normocephalic.     Comments: Abrasion to left temple with mild to moderate local edema.  Posterior scalp abrasion.  PERRL, EOMI Cardiovascular:     Rate and Rhythm: Normal rate and regular rhythm.     Heart sounds: No murmur heard.   Pulmonary:     Effort: Pulmonary effort is normal. No respiratory distress.     Breath sounds: Normal breath sounds.  Chest:     Chest wall: No tenderness.  Abdominal:     Palpations: Abdomen is soft.     Tenderness: There is no abdominal tenderness. There is no guarding or rebound.  Musculoskeletal:        General: Tenderness present.     Cervical back: Normal range of motion and neck supple.     Comments: Abrasion and tenderness to left shoulder with decreased ROM.  2+ radial pulses bilaterally.  Ecchymosis to right forearm without significant tenderness.  Abrasion to right distal forearm.  Mild TTP over right hip.  Ecchymosis to right flank.    Skin:    General: Skin is warm and dry.  Neurological:     Mental Status: She is alert and oriented to person, place, and time.     Comments: Five out of five strength in all four extremities on distal strength testing. Proximal injuries limits proximal strength testing.  Psychiatric:        Behavior: Behavior normal.     ED Results / Procedures / Treatments   Labs (all labs ordered are listed, but only abnormal results are displayed) Labs Reviewed  COMPREHENSIVE METABOLIC PANEL - Abnormal; Notable for the following components:      Result Value   Glucose, Bld 57 (*)    All other components within normal limits  CBC WITH DIFFERENTIAL/PLATELET - Abnormal; Notable for the following components:   WBC 10.7 (*)    RDW 16.8 (*)    All other components within normal limits  URINALYSIS, ROUTINE W REFLEX MICROSCOPIC - Abnormal; Notable for the following components:   Specific Gravity, Urine <1.005 (*)    All other components within normal limits  CBG MONITORING, ED - Abnormal; Notable for the  following components:   Glucose-Capillary 69 (*)    All other components within normal limits  RESPIRATORY PANEL BY RT PCR (FLU A&B, COVID)  CBG MONITORING, ED    EKG None  Radiology DG Chest 2 View  Result Date: 03/07/2020 CLINICAL DATA:  Pain status post fall EXAM: CHEST - 2 VIEW COMPARISON:  March 01, 2009 FINDINGS: The heart size and mediastinal contours are within  normal limits. Both lungs are clear. The visualized skeletal structures are unremarkable. Aortic calcifications are noted. IMPRESSION: No active cardiopulmonary disease. Electronically Signed   By: Constance Holster M.D.   On: 03/07/2020 23:05   CT Head Wo Contrast  Result Date: 03/07/2020 CLINICAL DATA:  Fall down 5 steps, head injury laceration, left chest and shoulder pain, no loss of consciousness EXAM: CT HEAD WITHOUT CONTRAST CT CERVICAL SPINE WITHOUT CONTRAST CT CHEST, ABDOMEN AND PELVIS WITH CONTRAST TECHNIQUE: Contiguous axial images were obtained from the base of the skull through the vertex without intravenous contrast. Multidetector CT imaging of the cervical spine was performed without intravenous contrast. Multiplanar CT image reconstructions were also generated. Multidetector CT imaging of the chest, abdomen and pelvis was performed following the standard protocol during bolus administration of intravenous contrast. CONTRAST:  179mL OMNIPAQUE IOHEXOL 300 MG/ML  SOLN COMPARISON:  CT head 02/04/2020, CTA head neck 11/25/2019 FINDINGS: CT HEAD FINDINGS Brain: No evidence of acute infarction, hemorrhage, hydrocephalus, extra-axial collection, visible mass lesion or mass effect. Vascular: Atherosclerotic calcification of the carotid siphons. No hyperdense vessel. Skull: Several sites of scalp contusion/swelling including the left frontal/supraorbital tissues and left parieto-occipital region. Large scalp hematoma. No subjacent calvarial fractures. No other acute osseous injury is seen. Sinuses/Orbits: Minimal thickening  in the ethmoid air cells. No layering air-fluid levels or pneumatized secretions. Chronic bilateral mastoid effusions, right slightly greater than left with some mild hyperostotic features compatible with this chronicity. Appearance similar to comparison imaging. No visible or suspected temporal bone fractures. Middle ear cavities are clear. Other: Few carious lesions within the dentition. CT CERVICAL FINDINGS Alignment: Cervical stabilization collar is not visualized at the time of exam. There is straightening and slight reversal of the normal cervical lordosis which may be degenerative or on a positional basis given the positioning on scout view. Reversal is centered at the C5-6 level. No evidence of traumatic listhesis. No abnormally widened, perched or jumped facets. Normal alignment of the craniocervical and atlantoaxial articulations. Skull base and vertebrae: The osseous structures appear diffusely demineralized which may limit detection of small or nondisplaced fractures. No visible skull base fractures. Dens is intact. No vertebral body height loss. Fractures of the left C5, C6 and C7 transverse processes as well as fractures of the left T1 and possibly T2 transverse processes. Additional fracture of the right posterior first rib. Soft tissues and spinal canal: Soft tissue thickening and swelling adjacent the left transverse process fractures with additional thickening along the paraspinal musculature including the left scalene levator muscle bellies. Additional contusive changes noted of the trapezius and rotator cuff musculature, better detailed on CT of the chest. Disc levels: Diffuse intervertebral disc height loss with multilevel spondylitic endplate changes throughout the cervical spine. Larger disc osteophyte complexes are present C3-4, C5-6, C6-7 and C7-T1 resulting in some mild to moderate canal stenoses. Additionally, questionable development of some ossification along the posterior longitudinal  ligament at the C7-T1 and T1-T2 levels as well. Multilevel uncinate spurring and facet hypertrophic changes are present throughout the spine with moderate to severe narrowing bilaterally C3-4 and more mild to moderate changes more diffusely throughout the cervical levels. Other:  Cervical carotid atherosclerosis is present. CT CHEST FINDINGS Cardiovascular: The aortic root is suboptimally assessed given cardiac pulsation artifact. Atherosclerotic plaque within the normal caliber aorta. No acute luminal abnormality of the imaged aorta. No periaortic stranding or hemorrhage. Extensive calcifications seen at the left subclavian artery origin with proximal vessel occlusion and what appears to be post surgical  changes from bypass of the vessel supplied by the left internal carotid. The right subclavian artery is also heavily calcified with some high-grade segmental stenosis proximally. Additional postsurgical changes noted near the level of the carotids albeit with some extensive native atheromatous plaque. No acute traumatic abnormality of the proximal great vessels are identified. Central pulmonary arteries are normal caliber. No large central filling defects on this non tailored examination of the pulmonary arteries. Normal cardiac size. Trace pericardial fluid is likely within physiologic normal. Three-vessel coronary artery atherosclerosis is noted. No acute or worrisome major venous abnormalities are seen. Mediastinum/Nodes: No mediastinal fluid or gas. Diminutive appearance of the thyroid gland. Otherwise normal thoracic inlet. No acute abnormality of the trachea or esophagus. No worrisome mediastinal, hilar or axillary adenopathy. Lungs/Pleura: Some minimal thickening is seen in the superior most portion of the superior segment of the left lower lobe adjacent the posterior left fourth rib fracture which could reflect some mild contusive change or atelectasis. No other acute traumatic abnormality of the lung  parenchyma is evident. No pneumothorax. No effusion. No consolidative opacity. No convincing features of edema. Stable confluent opacity in the bilateral lung apices compatible with regions of chronic pleuroparenchymal scarring and some associated architectural distortion. Stable sub solid opacity in the superior segment left lower lobe measuring 5 mm in size (5/61). 5 mm subpleural solid nodule seen in the periphery of the right lower lobe (5/107). Musculoskeletal: Fracture of the distal head left clavicle with intra-articular extension, surrounding thickening and mild widening of the [facility] joint which could reflect. Cervical fractures better detailed above. Redemonstration of the transverse process fractures T1 and possibly T2. Suspect a nondisplaced fracture of the posterior left third, fifth and sixth ribs, minimally displaced fracture of the left fourth rib posteriorly. Additional fractures of the posterior right first rib and third rib near the transverse process. No other acute traumatic osseous injury of the chest wall or imaged thoracic spine. Minimal soft tissue thickening and intercostal thickening adjacent the rib fractures. Background of diffuse multilevel discogenic and facet degenerative changes throughout the thoracic spine including some mild scoliotic curvature. Additional arthrosis bilateral shoulders. Nerve stimulator battery pack seen in the soft tissues of the right flank, leads entering the canal at the T10-11 level, and terminating posterior to the T8-9 vertebrae. CT ABDOMEN PELVIS FINDINGS Hepatobiliary: No direct hepatic injury or perihepatic hematoma. No focal liver abnormality is seen. Patient is post cholecystectomy. Slight prominence of the biliary tree likely related to reservoir effect. No calcified intraductal gallstones. Pancreas: No pancreatic contusive change or ductal disruption. No pancreatic ductal dilatation or surrounding inflammatory changes. Spleen: No direct splenic injury or  perisplenic hemorrhage. Normal in size. No concerning splenic lesions. Adrenals/Urinary Tract: No convincing adrenal hemorrhage or suspicious adrenal lesion. Mild lobular thickening of the adrenal glands may be senescent hyperplasia. No direct renal injury or perinephric hemorrhage. Kidneys are normally located with symmetric enhancementand excretion without extravasation of contrast on the excretory delayed phase imaging. No suspicious renal lesion, urolithiasis or hydronephrosis. No evidence of direct bladder injury, rupture, or other acute bladder abnormality. Stomach/Bowel: Distal esophagus, stomach and duodenum are unremarkable. No focal small bowel thickening or dilatation. Vascular/Lymphatic: No evidence of direct vascular injury in the abdomen or pelvis. Extensive atherosclerotic calcification throughout the aorta and branch vessels. No aneurysm or ectasia. No suspicious or enlarged lymph nodes in the included lymphatic chains. Reproductive: Normal appearance of the uterus and adnexal structures. Other: Focal subcutaneous soft tissue thickening in the bilateral lower quadrants, left greater  than right may reflect sequela of prior injectable use. More contusive appearing soft tissue thickening and stranding is seen lateral to the left hip. No abdominopelvic free air or fluid. No bowel containing hernias. Musculoskeletal: No acute fracture or vertebral body height loss of the lumbar spine. Congenital nonfusion right L1 transverse process, benign variant. Mild levocurvature of the lumbar levels, apex L4. No traumatic listhesis. Mild retrolisthesis L4 on 5 is likely degenerative with advanced discogenic and facet degenerative changes at these levels. Additional diffuse degenerative changes throughout the lumbar spine maximal L5-S1. Bony pelvis is intact and congruent. Proximal femora are intact and normally located in the acetabula. Suspect some enthesopathic mineralization near the left lesser trochanter. Few  benign bone islands throughout the pelvis. Musculature is normal and symmetric. IMPRESSION: CT head: 1. Several sites of scalp contusion/swelling including the left frontal/supraorbital tissues and left parieto-occipital region. No subjacent calvarial fractures. 2. No acute intracranial abnormality. 3. Chronic bilateral mastoid effusions, right slightly greater than left, with some mild hyperostotic features compatible with this chronicity. No visible or suspected temporal bone fractures. CT cervical spine: 1. Fractures of the left C5, C6 and C7 transverse processes coursing in close proximity but clearly into the transverse foramina. 2. Stranding and thickening adjacent the transverse process fractures as well as some thickening and stranding about the left paraspinal musculature. 3. No clear vertebral body fracture or height loss is seen. No traumatic listhesis. 4. 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 5. CT chest, abdomen pelvis. 1. Fractures of the left T1 and possibly T2 transverse processes. No other acute osseous injury of the thoracic or lumbar spine. 2. Suspect a nondisplaced fracture of the posterior left third, fifth and sixth ribs. Minimally displaced fracture of the left fourth rib posteriorly. Fractures of the posterior right first rib and third rib near the transverse process. No pneumothorax. Some minimal ground-glass adjacent the fourth rib fracture in the superior segment left lower lobe may reflect trace pulmonary contusion or atelectasis. 3. Fracture of the distal head left clavicle with intra-articular extension, surrounding thickening and mild widening of the AC joint. Small amount of stranding and trace hemorrhage adjacent the clavicular fracture but without clear vascular injury identified within the limitations of this exam. 4. Contusive changes of the left hip. No large hematoma or site of  active contrast extravasation. 5. 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 Additional high-grade segmental. 6. Focal skin thickening and subcutaneous soft tissue along the anterior abdominal wall, likely related to injectable use/insulin administration. 7. Prior cholecystectomy. 8. Aortic Atherosclerosis (ICD10-I70.0). These results were called by telephone at the time of interpretation on 03/07/2020 at 11:42 pm to provider [facility] , who verbally acknowledged these results. Electronically Signed   By: Lovena Le M.D.   On: 03/07/2020 23:43   CT Chest W Contrast  Result Date: 03/07/2020 CLINICAL DATA:  Fall down 5 steps, head injury laceration, left chest and shoulder pain, no loss of consciousness EXAM: CT HEAD WITHOUT CONTRAST CT CERVICAL SPINE WITHOUT CONTRAST CT CHEST, ABDOMEN AND PELVIS WITH CONTRAST TECHNIQUE: Contiguous axial images were obtained from the base of the skull through the vertex without intravenous contrast. Multidetector CT imaging of the cervical spine was performed without intravenous contrast. Multiplanar CT image reconstructions were also  generated. Multidetector CT imaging of the chest, abdomen and pelvis was performed following the standard protocol during bolus administration of intravenous contrast. CONTRAST:  165mL OMNIPAQUE IOHEXOL 300 MG/ML  SOLN COMPARISON:  CT head 02/04/2020, CTA head neck 11/25/2019 FINDINGS: CT HEAD FINDINGS Brain: No evidence of acute infarction, hemorrhage, hydrocephalus, extra-axial collection, visible mass lesion or mass effect. Vascular: Atherosclerotic calcification of the carotid siphons. No hyperdense vessel. Skull: Several sites of scalp contusion/swelling including the left frontal/supraorbital tissues and left parieto-occipital  region. Large scalp hematoma. No subjacent calvarial fractures. No other acute osseous injury is seen. Sinuses/Orbits: Minimal thickening in the ethmoid air cells. No layering air-fluid levels or pneumatized secretions. Chronic bilateral mastoid effusions, right slightly greater than left with some mild hyperostotic features compatible with this chronicity. Appearance similar to comparison imaging. No visible or suspected temporal bone fractures. Middle ear cavities are clear. Other: Few carious lesions within the dentition. CT CERVICAL FINDINGS Alignment: Cervical stabilization collar is not visualized at the time of exam. There is straightening and slight reversal of the normal cervical lordosis which may be degenerative or on a positional basis given the positioning on scout view. Reversal is centered at the C5-6 level. No evidence of traumatic listhesis. No abnormally widened, perched or jumped facets. Normal alignment of the craniocervical and atlantoaxial articulations. Skull base and vertebrae: The osseous structures appear diffusely demineralized which may limit detection of small or nondisplaced fractures. No visible skull base fractures. Dens is intact. No vertebral body height loss. Fractures of the left C5, C6 and C7 transverse processes as well as fractures of the left T1 and possibly T2 transverse processes. Additional fracture of the right posterior first rib. Soft tissues and spinal canal: Soft tissue thickening and swelling adjacent the left transverse process fractures with additional thickening along the paraspinal musculature including the left scalene levator muscle bellies. Additional contusive changes noted of the trapezius and rotator cuff musculature, better detailed on CT of the chest. Disc levels: Diffuse intervertebral disc height loss with multilevel spondylitic endplate changes throughout the cervical spine. Larger disc osteophyte complexes are present C3-4, C5-6, C6-7 and C7-T1  resulting in some mild to moderate canal stenoses. Additionally, questionable development of some ossification along the posterior longitudinal ligament at the C7-T1 and T1-T2 levels as well. Multilevel uncinate spurring and facet hypertrophic changes are present throughout the spine with moderate to severe narrowing bilaterally C3-4 and more mild to moderate changes more diffusely throughout the cervical levels. Other:  Cervical carotid atherosclerosis is present. CT CHEST FINDINGS Cardiovascular: The aortic root is suboptimally assessed given cardiac pulsation artifact. Atherosclerotic plaque within the normal caliber aorta. No acute luminal abnormality of the imaged aorta. No periaortic stranding or hemorrhage. 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 The right subclavian artery is also heavily calcified with some high-grade segmental stenosis proximally. Additional postsurgical changes noted near the level of the carotids albeit with some extensive native atheromatous plaque. No acute traumatic abnormality of the proximal great vessels are identified. Central pulmonary arteries are normal caliber. No large central filling defects on this non tailored examination of the pulmonary arteries. Normal cardiac size. Trace pericardial fluid is likely within physiologic normal. Three-vessel coronary artery atherosclerosis is noted. No acute or worrisome major venous abnormalities are seen. Mediastinum/Nodes: No mediastinal fluid or gas. Diminutive appearance of the thyroid gland. Otherwise normal thoracic inlet. No  acute abnormality of the trachea or esophagus. No worrisome mediastinal, hilar or axillary adenopathy. Lungs/Pleura: Some minimal thickening is seen in the superior most portion of the superior segment of the left lower lobe adjacent the  posterior left fourth rib fracture which could reflect some mild contusive change or atelectasis. No other acute traumatic abnormality of the lung parenchyma is evident. No pneumothorax. No effusion. No consolidative opacity. No convincing features of edema. Stable confluent opacity in the bilateral lung apices compatible with regions of chronic pleuroparenchymal scarring and some associated architectural distortion. Stable sub solid opacity in the superior segment left lower lobe measuring 5 mm in size (5/61). 5 mm subpleural solid nodule seen in the periphery of the right lower lobe (5/107). Musculoskeletal: Fracture of the distal head left clavicle with intra-articular extension, surrounding thickening and mild widening of the [facility] joint which could reflect. Cervical fractures better detailed above. Redemonstration of the transverse process fractures T1 and possibly T2. Suspect a nondisplaced fracture of the posterior left third, fifth and sixth ribs, minimally displaced fracture of the left fourth rib posteriorly. Additional fractures of the posterior right first rib and third rib near the transverse process. No other acute traumatic osseous injury of the chest wall or imaged thoracic spine. Minimal soft tissue thickening and intercostal thickening adjacent the rib fractures. Background of diffuse multilevel discogenic and facet degenerative changes throughout the thoracic spine including some mild scoliotic curvature. Additional arthrosis bilateral shoulders. Nerve stimulator battery pack seen in the soft tissues of the right flank, leads entering the canal at the T10-11 level, and terminating posterior to the T8-9 vertebrae. CT ABDOMEN PELVIS FINDINGS Hepatobiliary: No direct hepatic injury or perihepatic hematoma. No focal liver abnormality is seen. Patient is post cholecystectomy. Slight prominence of the biliary tree likely related to reservoir effect. No calcified intraductal gallstones. Pancreas: No  pancreatic contusive change or ductal disruption. No pancreatic ductal dilatation or surrounding inflammatory changes. Spleen: No direct splenic injury or perisplenic hemorrhage. Normal in size. No concerning splenic lesions. Adrenals/Urinary Tract: No convincing adrenal hemorrhage or suspicious adrenal lesion. Mild lobular thickening of the adrenal glands may be senescent hyperplasia. No direct renal injury or perinephric hemorrhage. Kidneys are normally located with symmetric enhancementand excretion without extravasation of contrast on the excretory delayed phase imaging. No suspicious renal lesion, urolithiasis or hydronephrosis. No evidence of direct bladder injury, rupture, or other acute bladder abnormality. Stomach/Bowel: Distal esophagus, stomach and duodenum are unremarkable. No focal small bowel thickening or dilatation. Vascular/Lymphatic: No evidence of direct vascular injury in the abdomen or pelvis. Extensive atherosclerotic calcification throughout the aorta and branch vessels. No aneurysm or ectasia. No suspicious or enlarged lymph nodes in the included lymphatic chains. Reproductive: Normal appearance of the uterus and adnexal structures. Other: Focal subcutaneous soft tissue thickening in the bilateral lower quadrants, left greater than right may reflect sequela of prior injectable use. More contusive appearing soft tissue thickening and stranding is seen lateral to the left hip. No abdominopelvic free air or fluid. No bowel containing hernias. Musculoskeletal: No acute fracture or vertebral body height loss of the lumbar spine. Congenital nonfusion right L1 transverse process, benign variant. Mild levocurvature of the lumbar levels, apex L4. No traumatic listhesis. Mild retrolisthesis L4 on 5 is likely degenerative with advanced discogenic and facet degenerative changes at these levels. Additional diffuse degenerative changes throughout the lumbar spine maximal L5-S1. Bony pelvis is intact and  congruent. Proximal femora are intact and normally located in the acetabula. Suspect some enthesopathic mineralization near the  left lesser trochanter. Few benign bone islands throughout the pelvis. Musculature is normal and symmetric. IMPRESSION: CT head: 1. Several sites of scalp contusion/swelling including the left frontal/supraorbital tissues and left parieto-occipital region. No subjacent calvarial fractures. 2. No acute intracranial abnormality. 3. Chronic bilateral mastoid effusions, right slightly greater than left, with some mild hyperostotic features compatible with this chronicity. No visible or suspected temporal bone fractures. CT cervical spine: 1. Fractures of the left C5, C6 and C7 transverse processes coursing in close proximity but clearly into the transverse foramina. 2. Stranding and thickening adjacent the transverse process fractures as well as some thickening and stranding about the left paraspinal musculature. 3. No clear vertebral body fracture or height loss is seen. No traumatic listhesis. 4. 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 5. CT chest, abdomen pelvis. 1. Fractures of the left T1 and possibly T2 transverse processes. No other acute osseous injury of the thoracic or lumbar spine. 2. Suspect a nondisplaced fracture of the posterior left third, fifth and sixth ribs. Minimally displaced fracture of the left fourth rib posteriorly. Fractures of the posterior right first rib and third rib near the transverse process. No pneumothorax. Some minimal ground-glass adjacent the fourth rib fracture in the superior segment left lower lobe may reflect trace pulmonary contusion or atelectasis. 3. Fracture of the distal head left clavicle with intra-articular extension, surrounding thickening and mild widening of the AC joint. Small amount of stranding and trace hemorrhage adjacent the clavicular  fracture but without clear vascular injury identified within the limitations of this exam. 4. Contusive changes of the left hip. No large hematoma or site of active contrast extravasation. 5. 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 Additional high-grade segmental. 6. Focal skin thickening and subcutaneous soft tissue along the anterior abdominal wall, likely related to injectable use/insulin administration. 7. Prior cholecystectomy. 8. Aortic Atherosclerosis (ICD10-I70.0). These results were called by telephone at the time of interpretation on 03/07/2020 at 11:42 pm to provider [facility] , who verbally acknowledged these results. Electronically Signed   By: Lovena Le M.D.   On: 03/07/2020 23:43   CT Cervical Spine Wo Contrast  Result Date: 03/07/2020 CLINICAL DATA:  Fall down 5 steps, head injury laceration, left chest and shoulder pain, no loss of consciousness EXAM: CT HEAD WITHOUT CONTRAST CT CERVICAL SPINE WITHOUT CONTRAST CT CHEST, ABDOMEN AND PELVIS WITH CONTRAST TECHNIQUE: Contiguous axial images were obtained from the base of the skull through the vertex without intravenous contrast. Multidetector CT imaging of the cervical spine was performed without intravenous contrast. Multiplanar CT image reconstructions were also generated. Multidetector CT imaging of the chest, abdomen and pelvis was performed following the standard protocol during bolus administration of intravenous contrast. CONTRAST:  139mL OMNIPAQUE IOHEXOL 300 MG/ML  SOLN COMPARISON:  CT head 02/04/2020, CTA head neck 11/25/2019 FINDINGS: CT HEAD FINDINGS Brain: No evidence of acute infarction, hemorrhage, hydrocephalus, extra-axial collection, visible mass lesion or mass effect. Vascular: Atherosclerotic calcification of the  carotid siphons. No hyperdense vessel. Skull: Several sites of scalp contusion/swelling including the left frontal/supraorbital tissues and left parieto-occipital region. Large scalp hematoma. No subjacent calvarial fractures. No other acute osseous injury is seen. Sinuses/Orbits: Minimal thickening in the ethmoid air cells. No layering air-fluid levels or pneumatized secretions. Chronic  bilateral mastoid effusions, right slightly greater than left with some mild hyperostotic features compatible with this chronicity. Appearance similar to comparison imaging. No visible or suspected temporal bone fractures. Middle ear cavities are clear. Other: Few carious lesions within the dentition. CT CERVICAL FINDINGS Alignment: Cervical stabilization collar is not visualized at the time of exam. There is straightening and slight reversal of the normal cervical lordosis which may be degenerative or on a positional basis given the positioning on scout view. Reversal is centered at the C5-6 level. No evidence of traumatic listhesis. No abnormally widened, perched or jumped facets. Normal alignment of the craniocervical and atlantoaxial articulations. Skull base and vertebrae: The osseous structures appear diffusely demineralized which may limit detection of small or nondisplaced fractures. No visible skull base fractures. Dens is intact. No vertebral body height loss. Fractures of the left C5, C6 and C7 transverse processes as well as fractures of the left T1 and possibly T2 transverse processes. Additional fracture of the right posterior first rib. Soft tissues and spinal canal: Soft tissue thickening and swelling adjacent the left transverse process fractures with additional thickening along the paraspinal musculature including the left scalene levator muscle bellies. Additional contusive changes noted of the trapezius and rotator cuff musculature, better detailed on CT of the chest. Disc levels: Diffuse intervertebral disc  height loss with multilevel spondylitic endplate changes throughout the cervical spine. Larger disc osteophyte complexes are present C3-4, C5-6, C6-7 and C7-T1 resulting in some mild to moderate canal stenoses. Additionally, questionable development of some ossification along the posterior longitudinal ligament at the C7-T1 and T1-T2 levels as well. Multilevel uncinate spurring and facet hypertrophic changes are present throughout the spine with moderate to severe narrowing bilaterally C3-4 and more mild to moderate changes more diffusely throughout the cervical levels. Other:  Cervical carotid atherosclerosis is present. CT CHEST FINDINGS Cardiovascular: The aortic root is suboptimally assessed given cardiac pulsation artifact. Atherosclerotic plaque within the normal caliber aorta. No acute luminal abnormality of the imaged aorta. No periaortic stranding or hemorrhage. 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 The right subclavian artery is also heavily calcified with some high-grade segmental stenosis proximally. Additional postsurgical changes noted near the level of the carotids albeit with some extensive native atheromatous plaque. No acute traumatic abnormality of the proximal great vessels are identified. Central pulmonary arteries are normal caliber. No large central filling defects on this non tailored examination of the pulmonary arteries. Normal cardiac size. Trace pericardial fluid is likely within physiologic normal. Three-vessel coronary artery atherosclerosis is noted. No acute or worrisome major venous abnormalities are seen. Mediastinum/Nodes: No mediastinal fluid or gas. Diminutive appearance of the thyroid gland. Otherwise normal thoracic inlet. No acute abnormality of the trachea or esophagus. No worrisome mediastinal, hilar or  axillary adenopathy. Lungs/Pleura: Some minimal thickening is seen in the superior most portion of the superior segment of the left lower lobe adjacent the posterior left fourth rib fracture which could reflect some mild contusive change or atelectasis. No other acute traumatic abnormality of the lung parenchyma is evident. No pneumothorax. No effusion. No consolidative opacity. No convincing features of edema. Stable confluent opacity in the bilateral lung apices compatible with regions of chronic pleuroparenchymal scarring and some associated architectural distortion. Stable sub solid opacity in the superior segment left lower lobe measuring 5 mm in size (5/61). 5 mm subpleural solid nodule  seen in the periphery of the right lower lobe (5/107). Musculoskeletal: Fracture of the distal head left clavicle with intra-articular extension, surrounding thickening and mild widening of the [facility] joint which could reflect. Cervical fractures better detailed above. Redemonstration of the transverse process fractures T1 and possibly T2. Suspect a nondisplaced fracture of the posterior left third, fifth and sixth ribs, minimally displaced fracture of the left fourth rib posteriorly. Additional fractures of the posterior right first rib and third rib near the transverse process. No other acute traumatic osseous injury of the chest wall or imaged thoracic spine. Minimal soft tissue thickening and intercostal thickening adjacent the rib fractures. Background of diffuse multilevel discogenic and facet degenerative changes throughout the thoracic spine including some mild scoliotic curvature. Additional arthrosis bilateral shoulders. Nerve stimulator battery pack seen in the soft tissues of the right flank, leads entering the canal at the T10-11 level, and terminating posterior to the T8-9 vertebrae. CT ABDOMEN PELVIS FINDINGS Hepatobiliary: No direct hepatic injury or perihepatic hematoma. No focal liver abnormality is seen. Patient  is post cholecystectomy. Slight prominence of the biliary tree likely related to reservoir effect. No calcified intraductal gallstones. Pancreas: No pancreatic contusive change or ductal disruption. No pancreatic ductal dilatation or surrounding inflammatory changes. Spleen: No direct splenic injury or perisplenic hemorrhage. Normal in size. No concerning splenic lesions. Adrenals/Urinary Tract: No convincing adrenal hemorrhage or suspicious adrenal lesion. Mild lobular thickening of the adrenal glands may be senescent hyperplasia. No direct renal injury or perinephric hemorrhage. Kidneys are normally located with symmetric enhancementand excretion without extravasation of contrast on the excretory delayed phase imaging. No suspicious renal lesion, urolithiasis or hydronephrosis. No evidence of direct bladder injury, rupture, or other acute bladder abnormality. Stomach/Bowel: Distal esophagus, stomach and duodenum are unremarkable. No focal small bowel thickening or dilatation. Vascular/Lymphatic: No evidence of direct vascular injury in the abdomen or pelvis. Extensive atherosclerotic calcification throughout the aorta and branch vessels. No aneurysm or ectasia. No suspicious or enlarged lymph nodes in the included lymphatic chains. Reproductive: Normal appearance of the uterus and adnexal structures. Other: Focal subcutaneous soft tissue thickening in the bilateral lower quadrants, left greater than right may reflect sequela of prior injectable use. More contusive appearing soft tissue thickening and stranding is seen lateral to the left hip. No abdominopelvic free air or fluid. No bowel containing hernias. Musculoskeletal: No acute fracture or vertebral body height loss of the lumbar spine. Congenital nonfusion right L1 transverse process, benign variant. Mild levocurvature of the lumbar levels, apex L4. No traumatic listhesis. Mild retrolisthesis L4 on 5 is likely degenerative with advanced discogenic and facet  degenerative changes at these levels. Additional diffuse degenerative changes throughout the lumbar spine maximal L5-S1. Bony pelvis is intact and congruent. Proximal femora are intact and normally located in the acetabula. Suspect some enthesopathic mineralization near the left lesser trochanter. Few benign bone islands throughout the pelvis. Musculature is normal and symmetric. IMPRESSION: CT head: 1. Several sites of scalp contusion/swelling including the left frontal/supraorbital tissues and left parieto-occipital region. No subjacent calvarial fractures. 2. No acute intracranial abnormality. 3. Chronic bilateral mastoid effusions, right slightly greater than left, with some mild hyperostotic features compatible with this chronicity. No visible or suspected temporal bone fractures. CT cervical spine: 1. Fractures of the left C5, C6 and C7 transverse processes coursing in close proximity but clearly into the transverse foramina. 2. Stranding and thickening adjacent the transverse process fractures as well as some thickening and stranding about the left paraspinal musculature. 3. No clear  vertebral body fracture or height loss is seen. No traumatic listhesis. 4. 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 5. CT chest, abdomen pelvis. 1. Fractures of the left T1 and possibly T2 transverse processes. No other acute osseous injury of the thoracic or lumbar spine. 2. Suspect a nondisplaced fracture of the posterior left third, fifth and sixth ribs. Minimally displaced fracture of the left fourth rib posteriorly. Fractures of the posterior right first rib and third rib near the transverse process. No pneumothorax. Some minimal ground-glass adjacent the fourth rib fracture in the superior segment left lower lobe may reflect trace pulmonary contusion or atelectasis. 3. Fracture of the distal head left clavicle with  intra-articular extension, surrounding thickening and mild widening of the AC joint. Small amount of stranding and trace hemorrhage adjacent the clavicular fracture but without clear vascular injury identified within the limitations of this exam. 4. Contusive changes of the left hip. No large hematoma or site of active contrast extravasation. 5. 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 Additional high-grade segmental. 6. Focal skin thickening and subcutaneous soft tissue along the anterior abdominal wall, likely related to injectable use/insulin administration. 7. Prior cholecystectomy. 8. Aortic Atherosclerosis (ICD10-I70.0). These results were called by telephone at the time of interpretation on 03/07/2020 at 11:42 pm to provider [facility] , who verbally acknowledged these results. Electronically Signed   By: Lovena Le M.D.   On: 03/07/2020 23:43   CT Abdomen Pelvis W Contrast  Result Date: 03/07/2020 CLINICAL DATA:  Fall down 5 steps, head injury laceration, left chest and shoulder pain, no loss of consciousness EXAM: CT HEAD WITHOUT CONTRAST CT CERVICAL SPINE WITHOUT CONTRAST CT CHEST, ABDOMEN AND PELVIS WITH CONTRAST TECHNIQUE: Contiguous axial images were obtained from the base of the skull through the vertex without intravenous contrast. Multidetector CT imaging of the cervical spine was performed without intravenous contrast. Multiplanar CT image reconstructions were also generated. Multidetector CT imaging of the chest, abdomen and pelvis was performed following the standard protocol during bolus administration of intravenous contrast. CONTRAST:  132mL OMNIPAQUE IOHEXOL 300 MG/ML  SOLN COMPARISON:  CT head 02/04/2020, CTA head neck 11/25/2019 FINDINGS: CT HEAD FINDINGS Brain: No evidence  of acute infarction, hemorrhage, hydrocephalus, extra-axial collection, visible mass lesion or mass effect. Vascular: Atherosclerotic calcification of the carotid siphons. No hyperdense vessel. Skull: Several sites of scalp contusion/swelling including the left frontal/supraorbital tissues and left parieto-occipital region. Large scalp hematoma. No subjacent calvarial fractures. No other acute osseous injury is seen. Sinuses/Orbits: Minimal thickening in the ethmoid air cells. No layering air-fluid levels or pneumatized secretions. Chronic bilateral mastoid effusions, right slightly greater than left with some mild hyperostotic features compatible with this chronicity. Appearance similar to comparison imaging. No visible or suspected temporal bone fractures. Middle ear cavities are clear. Other: Few carious lesions within the dentition. CT CERVICAL FINDINGS Alignment: Cervical stabilization collar is not visualized at the time of exam. There is straightening and slight reversal of the normal cervical lordosis which may be degenerative or on a positional basis given the positioning on scout view. Reversal is centered at the C5-6 level. No evidence of traumatic listhesis. No abnormally widened, perched or jumped facets. Normal alignment of the craniocervical and atlantoaxial articulations. Skull base and vertebrae: The osseous structures  appear diffusely demineralized which may limit detection of small or nondisplaced fractures. No visible skull base fractures. Dens is intact. No vertebral body height loss. Fractures of the left C5, C6 and C7 transverse processes as well as fractures of the left T1 and possibly T2 transverse processes. Additional fracture of the right posterior first rib. Soft tissues and spinal canal: Soft tissue thickening and swelling adjacent the left transverse process fractures with additional thickening along the paraspinal musculature including the left scalene levator muscle bellies.  Additional contusive changes noted of the trapezius and rotator cuff musculature, better detailed on CT of the chest. Disc levels: Diffuse intervertebral disc height loss with multilevel spondylitic endplate changes throughout the cervical spine. Larger disc osteophyte complexes are present C3-4, C5-6, C6-7 and C7-T1 resulting in some mild to moderate canal stenoses. Additionally, questionable development of some ossification along the posterior longitudinal ligament at the C7-T1 and T1-T2 levels as well. Multilevel uncinate spurring and facet hypertrophic changes are present throughout the spine with moderate to severe narrowing bilaterally C3-4 and more mild to moderate changes more diffusely throughout the cervical levels. Other:  Cervical carotid atherosclerosis is present. CT CHEST FINDINGS Cardiovascular: The aortic root is suboptimally assessed given cardiac pulsation artifact. Atherosclerotic plaque within the normal caliber aorta. No acute luminal abnormality of the imaged aorta. No periaortic stranding or hemorrhage. 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 The right subclavian artery is also heavily calcified with some high-grade segmental stenosis proximally. Additional postsurgical changes noted near the level of the carotids albeit with some extensive native atheromatous plaque. No acute traumatic abnormality of the proximal great vessels are identified. Central pulmonary arteries are normal caliber. No large central filling defects on this non tailored examination of the pulmonary arteries. Normal cardiac size. Trace pericardial fluid is likely within physiologic normal. Three-vessel coronary artery atherosclerosis is noted. No acute or worrisome major venous abnormalities are seen. Mediastinum/Nodes: No mediastinal fluid or gas.  Diminutive appearance of the thyroid gland. Otherwise normal thoracic inlet. No acute abnormality of the trachea or esophagus. No worrisome mediastinal, hilar or axillary adenopathy. Lungs/Pleura: Some minimal thickening is seen in the superior most portion of the superior segment of the left lower lobe adjacent the posterior left fourth rib fracture which could reflect some mild contusive change or atelectasis. No other acute traumatic abnormality of the lung parenchyma is evident. No pneumothorax. No effusion. No consolidative opacity. No convincing features of edema. Stable confluent opacity in the bilateral lung apices compatible with regions of chronic pleuroparenchymal scarring and some associated architectural distortion. Stable sub solid opacity in the superior segment left lower lobe measuring 5 mm in size (5/61). 5 mm subpleural solid nodule seen in the periphery of the right lower lobe (5/107). Musculoskeletal: Fracture of the distal head left clavicle with intra-articular extension, surrounding thickening and mild widening of the [facility] joint which could reflect. Cervical fractures better detailed above. Redemonstration of the transverse process fractures T1 and possibly T2. Suspect a nondisplaced fracture of the posterior left third, fifth and sixth ribs, minimally displaced fracture of the left fourth rib posteriorly. Additional fractures of the posterior right first rib and third rib near the transverse process. No other acute traumatic osseous injury of the chest wall or imaged thoracic spine. Minimal soft tissue thickening and intercostal thickening adjacent the rib fractures. Background of diffuse multilevel discogenic and  facet degenerative changes throughout the thoracic spine including some mild scoliotic curvature. Additional arthrosis bilateral shoulders. Nerve stimulator battery pack seen in the soft tissues of the right flank, leads entering the canal at the T10-11 level, and terminating  posterior to the T8-9 vertebrae. CT ABDOMEN PELVIS FINDINGS Hepatobiliary: No direct hepatic injury or perihepatic hematoma. No focal liver abnormality is seen. Patient is post cholecystectomy. Slight prominence of the biliary tree likely related to reservoir effect. No calcified intraductal gallstones. Pancreas: No pancreatic contusive change or ductal disruption. No pancreatic ductal dilatation or surrounding inflammatory changes. Spleen: No direct splenic injury or perisplenic hemorrhage. Normal in size. No concerning splenic lesions. Adrenals/Urinary Tract: No convincing adrenal hemorrhage or suspicious adrenal lesion. Mild lobular thickening of the adrenal glands may be senescent hyperplasia. No direct renal injury or perinephric hemorrhage. Kidneys are normally located with symmetric enhancementand excretion without extravasation of contrast on the excretory delayed phase imaging. No suspicious renal lesion, urolithiasis or hydronephrosis. No evidence of direct bladder injury, rupture, or other acute bladder abnormality. Stomach/Bowel: Distal esophagus, stomach and duodenum are unremarkable. No focal small bowel thickening or dilatation. Vascular/Lymphatic: No evidence of direct vascular injury in the abdomen or pelvis. Extensive atherosclerotic calcification throughout the aorta and branch vessels. No aneurysm or ectasia. No suspicious or enlarged lymph nodes in the included lymphatic chains. Reproductive: Normal appearance of the uterus and adnexal structures. Other: Focal subcutaneous soft tissue thickening in the bilateral lower quadrants, left greater than right may reflect sequela of prior injectable use. More contusive appearing soft tissue thickening and stranding is seen lateral to the left hip. No abdominopelvic free air or fluid. No bowel containing hernias. Musculoskeletal: No acute fracture or vertebral body height loss of the lumbar spine. Congenital nonfusion right L1 transverse process, benign  variant. Mild levocurvature of the lumbar levels, apex L4. No traumatic listhesis. Mild retrolisthesis L4 on 5 is likely degenerative with advanced discogenic and facet degenerative changes at these levels. Additional diffuse degenerative changes throughout the lumbar spine maximal L5-S1. Bony pelvis is intact and congruent. Proximal femora are intact and normally located in the acetabula. Suspect some enthesopathic mineralization near the left lesser trochanter. Few benign bone islands throughout the pelvis. Musculature is normal and symmetric. IMPRESSION: CT head: 1. Several sites of scalp contusion/swelling including the left frontal/supraorbital tissues and left parieto-occipital region. No subjacent calvarial fractures. 2. No acute intracranial abnormality. 3. Chronic bilateral mastoid effusions, right slightly greater than left, with some mild hyperostotic features compatible with this chronicity. No visible or suspected temporal bone fractures. CT cervical spine: 1. Fractures of the left C5, C6 and C7 transverse processes coursing in close proximity but clearly into the transverse foramina. 2. Stranding and thickening adjacent the transverse process fractures as well as some thickening and stranding about the left paraspinal musculature. 3. No clear vertebral body fracture or height loss is seen. No traumatic listhesis. 4. 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 5. CT chest, abdomen pelvis. 1. Fractures of the left T1 and possibly T2 transverse processes. No other acute osseous injury of the thoracic or lumbar spine. 2. Suspect a nondisplaced fracture of the posterior left third, fifth and sixth ribs. Minimally displaced fracture of the left fourth rib posteriorly. Fractures of the posterior right first rib and third rib near the transverse process. No pneumothorax. Some minimal ground-glass adjacent the fourth  rib fracture in the superior segment left lower lobe may reflect trace pulmonary  contusion or atelectasis. 3. Fracture of the distal head left clavicle with intra-articular extension, surrounding thickening and mild widening of the AC joint. Small amount of stranding and trace hemorrhage adjacent the clavicular fracture but without clear vascular injury identified within the limitations of this exam. 4. Contusive changes of the left hip. No large hematoma or site of active contrast extravasation. 5. 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 Additional high-grade segmental. 6. Focal skin thickening and subcutaneous soft tissue along the anterior abdominal wall, likely related to injectable use/insulin administration. 7. Prior cholecystectomy. 8. Aortic Atherosclerosis (ICD10-I70.0). These results were called by telephone at the time of interpretation on 03/07/2020 at 11:42 pm to provider [facility] , who verbally acknowledged these results. Electronically Signed   By: Lovena Le M.D.   On: 03/07/2020 23:43   DG Shoulder Left  Result Date: 03/07/2020 CLINICAL DATA:  Pain status post fall EXAM: LEFT SHOULDER - 2+ VIEW COMPARISON:  None. FINDINGS: There is an acute, nondisplaced fracture of the distal left clavicle. There are degenerative changes of the acromioclavicular joint and left glenohumeral joint. There is calcific tendinopathy of the rotator cuff. There is no glenohumeral dislocation. IMPRESSION: 1. Acute, nondisplaced fracture of the distal left clavicle. 2. Degenerative changes of the acromioclavicular joint and left glenohumeral joint. 3. Calcific tendinopathy of the rotator cuff. Electronically Signed   By: Constance Holster M.D.   On: 03/07/2020 23:02   DG Hip Unilat W or Wo Pelvis 2-3  Views Right  Result Date: 03/07/2020 CLINICAL DATA:  Pain status post fall EXAM: DG HIP (WITH OR WITHOUT PELVIS) 2-3V RIGHT COMPARISON:  None. FINDINGS: There is no evidence of hip fracture or dislocation. There is no evidence of arthropathy or other focal bone abnormality. IMPRESSION: Negative. Electronically Signed   By: Constance Holster M.D.   On: 03/07/2020 23:06    Procedures Procedures (including critical care time)  Medications Ordered in ED Medications  fentaNYL (SUBLIMAZE) 100 MCG/2ML injection (has no administration in time range)  morphine 4 MG/ML injection 4 mg (has no administration in time range)  fentaNYL (SUBLIMAZE) injection 50 mcg (50 mcg Intravenous Given 03/07/20 2049)  ondansetron (ZOFRAN) injection 4 mg (4 mg Intravenous Given 03/07/20 2049)  iohexol (OMNIPAQUE) 300 MG/ML solution 100 mL (100 mLs Intravenous Contrast Given 03/07/20 2240)  fentaNYL (SUBLIMAZE) injection 100 mcg (100 mcg Intravenous Given 03/07/20 2307)    ED Course  I have reviewed the triage vital signs and the nursing notes.  Pertinent labs & imaging results that were available during my care of the patient were reviewed by me and considered in my medical decision making (see chart for details).    MDM Rules/Calculators/A&P                         patient here for evaluation of injuries after falling down a flight of stairs. She has multiple contusions and abrasions on examination. Imaging is significant for multiple cervical transverse process fractures, distal clavicle fracture, multiple rib fractures. Patient is on chronic pain medications at home, requires multiple doses of pain medications in the emergency department for pain control. She requires two people to assist in sitting up in the stretcher. Feel that she will need to come in for pain control. Discussed with patient findings of studies and  recommendation for mission and she is in agreement treatment plan. Discussed with Josh, on-call  provider for neurosurgery, who recommends a hard cervical collar with outpatient follow-up in the office in four weeks. Discussed with Dr. Grandville Silos, with trauma surgery, who accepts the patient for admission.    Final Clinical Impression(s) / ED Diagnoses Final diagnoses:  Fall, initial encounter  Closed fracture of transverse process of cervical vertebra, initial encounter ([place])  Closed fracture of multiple ribs of both sides, initial encounter  Closed displaced fracture of acromial end of left clavicle, initial encounter    Rx / DC Orders ED Discharge Orders    None       Quintella Reichert, MD 03/08/20 [phone]

## 2020-03-08 ENCOUNTER — Encounter (HOSPITAL_COMMUNITY): Payer: Self-pay

## 2020-03-08 DIAGNOSIS — S12490A Other displaced fracture of fifth cervical vertebra, initial encounter for closed fracture: Secondary | ICD-10-CM | POA: Diagnosis present

## 2020-03-08 DIAGNOSIS — S22018A Other fracture of first thoracic vertebra, initial encounter for closed fracture: Secondary | ICD-10-CM | POA: Diagnosis present

## 2020-03-08 DIAGNOSIS — F17210 Nicotine dependence, cigarettes, uncomplicated: Secondary | ICD-10-CM | POA: Diagnosis present

## 2020-03-08 DIAGNOSIS — Z7902 Long term (current) use of antithrombotics/antiplatelets: Secondary | ICD-10-CM | POA: Diagnosis not present

## 2020-03-08 DIAGNOSIS — S5011XA Contusion of right forearm, initial encounter: Secondary | ICD-10-CM | POA: Diagnosis present

## 2020-03-08 DIAGNOSIS — Z7984 Long term (current) use of oral hypoglycemic drugs: Secondary | ICD-10-CM | POA: Diagnosis not present

## 2020-03-08 DIAGNOSIS — Z79890 Hormone replacement therapy (postmenopausal): Secondary | ICD-10-CM | POA: Diagnosis not present

## 2020-03-08 DIAGNOSIS — W109XXA Fall (on) (from) unspecified stairs and steps, initial encounter: Secondary | ICD-10-CM | POA: Diagnosis present

## 2020-03-08 DIAGNOSIS — Z794 Long term (current) use of insulin: Secondary | ICD-10-CM | POA: Diagnosis not present

## 2020-03-08 DIAGNOSIS — J449 Chronic obstructive pulmonary disease, unspecified: Secondary | ICD-10-CM | POA: Diagnosis present

## 2020-03-08 DIAGNOSIS — S2249XA Multiple fractures of ribs, unspecified side, initial encounter for closed fracture: Secondary | ICD-10-CM | POA: Diagnosis present

## 2020-03-08 DIAGNOSIS — S12690A Other displaced fracture of seventh cervical vertebra, initial encounter for closed fracture: Secondary | ICD-10-CM | POA: Diagnosis present

## 2020-03-08 DIAGNOSIS — E039 Hypothyroidism, unspecified: Secondary | ICD-10-CM | POA: Diagnosis present

## 2020-03-08 DIAGNOSIS — I10 Essential (primary) hypertension: Secondary | ICD-10-CM | POA: Diagnosis present

## 2020-03-08 DIAGNOSIS — Z79899 Other long term (current) drug therapy: Secondary | ICD-10-CM | POA: Diagnosis not present

## 2020-03-08 DIAGNOSIS — T1490XA Injury, unspecified, initial encounter: Secondary | ICD-10-CM | POA: Diagnosis present

## 2020-03-08 DIAGNOSIS — Y92009 Unspecified place in unspecified non-institutional (private) residence as the place of occurrence of the external cause: Secondary | ICD-10-CM | POA: Diagnosis not present

## 2020-03-08 DIAGNOSIS — S22028A Other fracture of second thoracic vertebra, initial encounter for closed fracture: Secondary | ICD-10-CM | POA: Diagnosis present

## 2020-03-08 DIAGNOSIS — Z885 Allergy status to narcotic agent status: Secondary | ICD-10-CM | POA: Diagnosis not present

## 2020-03-08 DIAGNOSIS — S42032A Displaced fracture of lateral end of left clavicle, initial encounter for closed fracture: Secondary | ICD-10-CM | POA: Diagnosis present

## 2020-03-08 DIAGNOSIS — Z20822 Contact with and (suspected) exposure to covid-19: Secondary | ICD-10-CM | POA: Diagnosis present

## 2020-03-08 DIAGNOSIS — E119 Type 2 diabetes mellitus without complications: Secondary | ICD-10-CM | POA: Diagnosis present

## 2020-03-08 DIAGNOSIS — S0001XA Abrasion of scalp, initial encounter: Secondary | ICD-10-CM | POA: Diagnosis present

## 2020-03-08 DIAGNOSIS — S12590A Other displaced fracture of sixth cervical vertebra, initial encounter for closed fracture: Secondary | ICD-10-CM | POA: Diagnosis present

## 2020-03-08 DIAGNOSIS — S2243XA Multiple fractures of ribs, bilateral, initial encounter for closed fracture: Secondary | ICD-10-CM | POA: Diagnosis present

## 2020-03-08 DIAGNOSIS — E785 Hyperlipidemia, unspecified: Secondary | ICD-10-CM | POA: Diagnosis present

## 2020-03-08 LAB — HEMOGLOBIN A1C
Hgb A1c MFr Bld: 6.4 % — ABNORMAL HIGH (ref 4.8–5.6)
Mean Plasma Glucose: 136.98 mg/dL

## 2020-03-08 LAB — URINALYSIS, ROUTINE W REFLEX MICROSCOPIC
Bilirubin Urine: NEGATIVE
Glucose, UA: NEGATIVE mg/dL
Hgb urine dipstick: NEGATIVE
Ketones, ur: NEGATIVE mg/dL
Leukocytes,Ua: NEGATIVE
Nitrite: NEGATIVE
Protein, ur: NEGATIVE mg/dL
Specific Gravity, Urine: <1.005 — ABNORMAL LOW (ref 1.005–1.030)
pH: 5.5 (ref 5.0–8.0)

## 2020-03-08 LAB — CBG MONITORING, ED
Glucose-Capillary: 150 mg/dL — ABNORMAL HIGH (ref 70–99)
Glucose-Capillary: 112 mg/dL — ABNORMAL HIGH (ref 70–99)
Glucose-Capillary: 97 mg/dL (ref 70–99)
Glucose-Capillary: 121 mg/dL — ABNORMAL HIGH (ref 70–99)

## 2020-03-08 LAB — GLUCOSE, CAPILLARY: Glucose-Capillary: 168 mg/dL — ABNORMAL HIGH (ref 70–99)

## 2020-03-08 LAB — RESPIRATORY PANEL BY RT PCR (FLU A&B, COVID)
Influenza A by PCR: NEGATIVE
Influenza B by PCR: NEGATIVE
SARS Coronavirus 2 by RT PCR: NEGATIVE

## 2020-03-08 MED ORDER — TAPENTADOL HCL ER 100 MG PO TB12: 100.0000 mg | ORAL_TABLET | Freq: Two times a day (BID) | ORAL | Status: DC

## 2020-03-08 MED ORDER — ZOLPIDEM TARTRATE 5 MG PO TABS
5.0000 mg | ORAL_TABLET | Freq: Every evening | ORAL | Status: DC | PRN
  Administered 2020-03-09 – 2020-03-10 (×2): 5 mg via ORAL
  Filled 2020-03-08 (×2): qty 1

## 2020-03-08 MED ORDER — ONDANSETRON HCL 4 MG/2ML IJ SOLN: 4.0000 mg | Freq: Four times a day (QID) | INTRAMUSCULAR | Status: DC | PRN

## 2020-03-08 MED ORDER — TRAMADOL HCL 50 MG PO TABS
50.0000 mg | ORAL_TABLET | Freq: Four times a day (QID) | ORAL | Status: DC | PRN
  Administered 2020-03-09 – 2020-03-11 (×5): 100 mg via ORAL
  Filled 2020-03-08 (×5): qty 2

## 2020-03-08 MED ORDER — LEVOTHYROXINE SODIUM 75 MCG PO TABS
175.0000 ug | ORAL_TABLET | Freq: Every day | ORAL | Status: DC
  Administered 2020-03-08 – 2020-03-11 (×4): 175 ug via ORAL
  Filled 2020-03-08 (×6): qty 1

## 2020-03-08 MED ORDER — METFORMIN HCL 500 MG PO TABS: 1000.0000 mg | ORAL_TABLET | Freq: Every day | ORAL | Status: DC

## 2020-03-08 MED ORDER — MORPHINE SULFATE (PF) 4 MG/ML IV SOLN
4.0000 mg | INTRAVENOUS | Status: DC | PRN
  Administered 2020-03-08 (×4): 4 mg via INTRAVENOUS
  Filled 2020-03-08 (×4): qty 1

## 2020-03-08 MED ORDER — OXYCODONE HCL 5 MG PO TABS: 5.0000 mg | ORAL_TABLET | ORAL | Status: DC | PRN

## 2020-03-08 MED ORDER — INSULIN ASPART 100 UNIT/ML ~~LOC~~ SOLN
0.0000 [IU] | SUBCUTANEOUS | Status: DC
  Administered 2020-03-08 – 2020-03-09 (×2): 1 [IU] via SUBCUTANEOUS
  Administered 2020-03-09 – 2020-03-10 (×5): 2 [IU] via SUBCUTANEOUS
  Administered 2020-03-10 (×2): 1 [IU] via SUBCUTANEOUS
  Administered 2020-03-11: 3 [IU] via SUBCUTANEOUS
  Administered 2020-03-11 (×2): 2 [IU] via SUBCUTANEOUS

## 2020-03-08 MED ORDER — INSULIN LISPRO (1 UNIT DIAL) 100 UNIT/ML (KWIKPEN)
15.0000 [IU] | PEN_INJECTOR | Freq: Three times a day (TID) | SUBCUTANEOUS | Status: DC
  Filled 2020-03-08: qty 3

## 2020-03-08 MED ORDER — METFORMIN HCL 500 MG PO TABS: 1000.0000 mg | ORAL_TABLET | Freq: Two times a day (BID) | ORAL | Status: DC

## 2020-03-08 MED ORDER — INSULIN GLARGINE (1 UNIT DIAL) 300 UNIT/ML ~~LOC~~ SOPN: 60.0000 [IU] | PEN_INJECTOR | Freq: Every day | SUBCUTANEOUS | Status: DC

## 2020-03-08 MED ORDER — ATORVASTATIN CALCIUM 80 MG PO TABS
80.0000 mg | ORAL_TABLET | Freq: Every evening | ORAL | Status: DC
  Administered 2020-03-09 – 2020-03-10 (×2): 80 mg via ORAL
  Filled 2020-03-08 (×2): qty 1

## 2020-03-08 MED ORDER — ACETAMINOPHEN 325 MG PO TABS: 650.0000 mg | ORAL_TABLET | ORAL | Status: DC | PRN

## 2020-03-08 MED ORDER — MORPHINE SULFATE (PF) 4 MG/ML IV SOLN
4.0000 mg | INTRAVENOUS | Status: DC | PRN
  Administered 2020-03-09 – 2020-03-10 (×2): 4 mg via INTRAVENOUS
  Filled 2020-03-08 (×2): qty 1

## 2020-03-08 MED ORDER — GABAPENTIN 300 MG PO CAPS
600.0000 mg | ORAL_CAPSULE | Freq: Three times a day (TID) | ORAL | Status: DC
  Administered 2020-03-08 – 2020-03-11 (×10): 600 mg via ORAL
  Filled 2020-03-08 (×10): qty 2

## 2020-03-08 MED ORDER — MORPHINE SULFATE (PF) 4 MG/ML IV SOLN
4.0000 mg | INTRAVENOUS | Status: DC | PRN
  Administered 2020-03-08: 4 mg via INTRAVENOUS
  Filled 2020-03-08: qty 1

## 2020-03-08 MED ORDER — METHOCARBAMOL 500 MG PO TABS
500.0000 mg | ORAL_TABLET | Freq: Four times a day (QID) | ORAL | Status: DC
  Administered 2020-03-08 – 2020-03-11 (×10): 500 mg via ORAL
  Filled 2020-03-08 (×10): qty 1

## 2020-03-08 MED ORDER — LISINOPRIL 10 MG PO TABS
10.0000 mg | ORAL_TABLET | Freq: Every day | ORAL | Status: DC
  Administered 2020-03-08 – 2020-03-11 (×4): 10 mg via ORAL
  Filled 2020-03-08 (×4): qty 1

## 2020-03-08 MED ORDER — ONDANSETRON 4 MG PO TBDP: 4.0000 mg | ORAL_TABLET | Freq: Four times a day (QID) | ORAL | Status: DC | PRN

## 2020-03-08 MED ORDER — ENOXAPARIN SODIUM 30 MG/0.3ML ~~LOC~~ SOLN
30.0000 mg | Freq: Two times a day (BID) | SUBCUTANEOUS | Status: DC
  Administered 2020-03-08 – 2020-03-11 (×6): 30 mg via SUBCUTANEOUS
  Filled 2020-03-08 (×6): qty 0.3

## 2020-03-08 MED ORDER — DOCUSATE SODIUM 100 MG PO CAPS
100.0000 mg | ORAL_CAPSULE | Freq: Two times a day (BID) | ORAL | Status: DC
  Administered 2020-03-08 – 2020-03-11 (×6): 100 mg via ORAL
  Filled 2020-03-08 (×6): qty 1

## 2020-03-08 MED ORDER — METFORMIN HCL 500 MG PO TABS: 1500.0000 mg | ORAL_TABLET | Freq: Every day | ORAL | Status: DC

## 2020-03-08 NOTE — ED Notes (Signed)
Pt friend, Izora Gala, states she will bring the patients medication list in the morning to verify her medications.

## 2020-03-08 NOTE — ED Notes (Signed)
Pt admitted to 5N[facility]; report called to [facility], [place].

## 2020-03-08 NOTE — ED Notes (Signed)
[place], [phone], please notify when pt transfers.

## 2020-03-08 NOTE — ED Notes (Signed)
Family has lowered side rail and allowed patient to sit on side of bed, despite instructions otherwise; requested family to not let pt sit on side of bed; pt states, "I've been walking around", however, offgoing nurse states pt has not been allowed out of bed since arrival.

## 2020-03-08 NOTE — ED Provider Notes (Signed)
Patient is still currently waiting for a bed at [facility].  A fall with multiple cervical and thoracic fractures.  Unable to obtain the brace here the patient is in a c-collar.  Will transfer patient to [facility] emergency room so that she can be evaluated by trauma who is the admitting doctor.  She remains hemodynamically stable and pain controlled.   Blanchie Dessert, MD 03/08/20 [phone]

## 2020-03-08 NOTE — ED Notes (Signed)
Pt eating oatmeal and drinking water without difficulty.

## 2020-03-08 NOTE — ED Notes (Signed)
Pt cycling vitals

## 2020-03-08 NOTE — H&P (Signed)
Terri Li is an 59 y.o. female.   Chief Complaint: fall HPI: Terri Li is a 59 yo female who presented as a transfer from [place] after a fall at home. Yesterday around 7pm, she turned and missed a step and fell down a single flight of stairs. She remembers the incident and denies loss of consciousness. Says she did not hit her head. Imaging workup showed multiple C spine and L spine transverse process fractures, as well as multiple rib fractures. She was transferred to [facility] for trauma evaluation.  Patient is on Plavix at home following a left carotid endarterectomy this past June.  Past Medical History:  Diagnosis Date  . COPD (chronic obstructive pulmonary disease) ([place])   . Diabetes mellitus without complication ([place])   . Hypertension   . Thyroid disease     Past Surgical History:  Procedure Laterality Date  . BACK SURGERY    . CAROTID ENDARTERECTOMY    . SPINAL CORD STIMULATOR IMPLANT      History reviewed. No pertinent family history. Social History:  reports that she has been smoking. She has been smoking about 1.00 pack per day. She has never used smokeless tobacco. She reports previous alcohol use. She reports that she does not use drugs.  Allergies:  Allergies  Allergen Reactions  . Hydrocodone Itching  . Codeine Itching    (Not in a hospital admission)   Results for orders placed or performed during the hospital encounter of 03/07/20 (from the past 48 hour(s))  Comprehensive metabolic panel     Status: Abnormal   Collection Time: 03/07/20  8:41 PM  Result Value Ref Range   Sodium 136 135 - 145 mmol/L   Potassium 3.7 3.5 - 5.1 mmol/L   Chloride 99 98 - 111 mmol/L   CO2 25 22 - 32 mmol/L   Glucose, Bld 57 (L) 70 - 99 mg/dL    Comment: Glucose reference range applies only to samples taken after fasting for at least 8 hours.   BUN 16 6 - 20 mg/dL   Creatinine, Ser 0.90 0.44 - 1.00 mg/dL   Calcium 9.3 8.9 - 10.3 mg/dL   Total Protein 7.8 6.5 -  8.1 g/dL   Albumin 4.5 3.5 - 5.0 g/dL   AST 31 15 - 41 U/L   ALT 25 0 - 44 U/L   Alkaline Phosphatase 45 38 - 126 U/L   Total Bilirubin 0.5 0.3 - 1.2 mg/dL   GFR, Estimated >60 >60 mL/min    Comment: (NOTE) Calculated using the CKD-EPI Creatinine Equation (2021)    Anion gap 12 5 - 15    Comment: Performed at [facility], [address] [place], [place] [address]  CBC with Differential     Status: Abnormal   Collection Time: 03/07/20  8:41 PM  Result Value Ref Range   WBC 10.7 (H) 4.0 - 10.5 K/uL   RBC 4.66 3.87 - 5.11 MIL/uL   Hemoglobin 12.7 12.0 - 15.0 g/dL   HCT 39.9 36 - 46 %   MCV 85.6 80.0 - 100.0 fL   MCH 27.3 26.0 - 34.0 pg   MCHC 31.8 30.0 - 36.0 g/dL   RDW 16.8 (H) 11.5 - 15.5 %   Platelets 340 150 - 400 K/uL   nRBC 0.0 0.0 - 0.2 %   Neutrophils Relative % 69 %   Neutro Abs 7.2 1.7 - 7.7 K/uL   Lymphocytes Relative 23 %   Lymphs Abs 2.5 0.7 -  4.0 K/uL   Monocytes Relative 7 %   Monocytes Absolute 0.8 0.1 - 1.0 K/uL   Eosinophils Relative 1 %   Eosinophils Absolute 0.1 0.0 - 0.5 K/uL   Basophils Relative 0 %   Basophils Absolute 0.0 0.0 - 0.1 K/uL   Immature Granulocytes 0 %   Abs Immature Granulocytes 0.04 0.00 - 0.07 K/uL    Comment: Performed at [facility], [address] [place], [place] [address]  CBG monitoring, ED     Status: Abnormal   Collection Time: 03/07/20 11:11 PM  Result Value Ref Range   Glucose-Capillary 69 (L) 70 - 99 mg/dL    Comment: Glucose reference range applies only to samples taken after fasting for at least 8 hours.  CBG monitoring, ED     Status: None   Collection Time: 03/07/20 11:50 PM  Result Value Ref Range   Glucose-Capillary 88 70 - 99 mg/dL    Comment: Glucose reference range applies only to samples taken after fasting for at least 8 hours.  Urinalysis, Routine w reflex microscopic     Status: Abnormal   Collection Time: 03/08/20 12:04 AM  Result Value Ref Range   Color, Urine YELLOW YELLOW    APPearance CLEAR CLEAR   Specific Gravity, Urine <1.005 (L) 1.005 - 1.030   pH 5.5 5.0 - 8.0   Glucose, UA NEGATIVE NEGATIVE mg/dL   Hgb urine dipstick NEGATIVE NEGATIVE   Bilirubin Urine NEGATIVE NEGATIVE   Ketones, ur NEGATIVE NEGATIVE mg/dL   Protein, ur NEGATIVE NEGATIVE mg/dL   Nitrite NEGATIVE NEGATIVE   Leukocytes,Ua NEGATIVE NEGATIVE    Comment: Microscopic not done on urines with negative protein, blood, leukocytes, nitrite, or glucose < 500 mg/dL. Performed at [facility], [address] [place], [place] [address]   Respiratory Panel by RT PCR (Flu A&B, Covid) - Nasopharyngeal Swab     Status: None   Collection Time: 03/08/20 12:04 AM   Specimen: Nasopharyngeal Swab  Result Value Ref Range   SARS Coronavirus 2 by RT PCR NEGATIVE NEGATIVE    Comment: (NOTE) SARS-CoV-2 target nucleic acids are NOT DETECTED.  The SARS-CoV-2 RNA is generally detectable in upper respiratoy specimens during the acute phase of infection. The lowest concentration of SARS-CoV-2 viral copies this assay can detect is 131 copies/mL. A negative result does not preclude SARS-Cov-2 infection and should not be used as the sole basis for treatment or other patient management decisions. A negative result may occur with  improper specimen collection/handling, submission of specimen other than nasopharyngeal swab, presence of viral mutation(s) within the areas targeted by this assay, and inadequate number of viral copies (<131 copies/mL). A negative result must be combined with clinical observations, patient history, and epidemiological information. The expected result is Negative.  Fact Sheet for Patients:  PinkCheek.be  Fact Sheet for Healthcare Providers:  GravelBags.it  This test is no t yet approved or cleared by the [place] FDA and  has been authorized for detection and/or diagnosis of SARS-CoV-2 by FDA under an  Emergency Use Authorization (EUA). This EUA will remain  in effect (meaning this test can be used) for the duration of the COVID-19 declaration under Section 564(b)(1) of the Act, 21 U.S.C. section 360bbb-3(b)(1), unless the authorization is terminated or revoked sooner.     Influenza A by PCR NEGATIVE NEGATIVE   Influenza B by PCR NEGATIVE NEGATIVE    Comment: (NOTE) The Xpert Xpress SARS-CoV-2/FLU/RSV assay is intended as an aid  in  the diagnosis of influenza from Nasopharyngeal swab specimens and  should not be used as a sole basis for treatment. Nasal washings and  aspirates are unacceptable for Xpert Xpress SARS-CoV-2/FLU/RSV  testing.  Fact Sheet for Patients: PinkCheek.be  Fact Sheet for Healthcare Providers: GravelBags.it  This test is not yet approved or cleared by the [place] FDA and  has been authorized for detection and/or diagnosis of SARS-CoV-2 by  FDA under an Emergency Use Authorization (EUA). This EUA will remain  in effect (meaning this test can be used) for the duration of the  Covid-19 declaration under Section 564(b)(1) of the Act, 21  U.S.C. section 360bbb-3(b)(1), unless the authorization is  terminated or revoked. Performed at [facility], [address] [place], [place] [address]   CBG monitoring, ED     Status: Abnormal   Collection Time: 03/08/20 10:07 AM  Result Value Ref Range   Glucose-Capillary 150 (H) 70 - 99 mg/dL    Comment: Glucose reference range applies only to samples taken after fasting for at least 8 hours.  Hemoglobin A1c     Status: Abnormal   Collection Time: 03/08/20 10:37 AM  Result Value Ref Range   Hgb A1c MFr Bld 6.4 (H) 4.8 - 5.6 %    Comment: (NOTE) Pre diabetes:          5.7%-6.4%  Diabetes:              >6.4%  Glycemic control for   <7.0% adults with diabetes    Mean Plasma Glucose 136.98 mg/dL    Comment: Performed at [facility] [address] [place], [place] [address]  CBG monitoring, ED     Status: Abnormal   Collection Time: 03/08/20 12:18 PM  Result Value Ref Range   Glucose-Capillary 112 (H) 70 - 99 mg/dL    Comment: Glucose reference range applies only to samples taken after fasting for at least 8 hours.  CBG monitoring, ED     Status: None   Collection Time: 03/08/20  4:48 PM  Result Value Ref Range   Glucose-Capillary 97 70 - 99 mg/dL    Comment: Glucose reference range applies only to samples taken after fasting for at least 8 hours.  CBG monitoring, ED     Status: Abnormal   Collection Time: 03/08/20  8:30 PM  Result Value Ref Range   Glucose-Capillary 121 (H) 70 - 99 mg/dL    Comment: Glucose reference range applies only to samples taken after fasting for at least 8 hours.   DG Chest 2 View  Result Date: 03/07/2020 CLINICAL DATA:  Pain status post fall EXAM: CHEST - 2 VIEW COMPARISON:  March 01, 2009 FINDINGS: The heart size and mediastinal contours are within normal limits. Both lungs are clear. The visualized skeletal structures are unremarkable. Aortic calcifications are noted. IMPRESSION: No active cardiopulmonary disease. Electronically Signed   By: Constance Holster M.D.   On: 03/07/2020 23:05   CT Head Wo Contrast  Result Date: 03/07/2020 CLINICAL DATA:  Fall down 5 steps, head injury laceration, left chest and shoulder pain, no loss of consciousness EXAM: CT HEAD WITHOUT CONTRAST CT CERVICAL SPINE WITHOUT CONTRAST CT CHEST, ABDOMEN AND PELVIS WITH CONTRAST TECHNIQUE: Contiguous axial images were obtained from the base of the skull through the vertex without intravenous contrast. Multidetector CT imaging of the cervical spine was performed without intravenous contrast. Multiplanar CT image reconstructions were also generated. Multidetector CT imaging of the chest,  abdomen and pelvis was performed following the standard protocol during bolus administration of intravenous contrast. CONTRAST:   162mL OMNIPAQUE IOHEXOL 300 MG/ML  SOLN COMPARISON:  CT head 02/04/2020, CTA head neck 11/25/2019 FINDINGS: CT HEAD FINDINGS Brain: No evidence of acute infarction, hemorrhage, hydrocephalus, extra-axial collection, visible mass lesion or mass effect. Vascular: Atherosclerotic calcification of the carotid siphons. No hyperdense vessel. Skull: Several sites of scalp contusion/swelling including the left frontal/supraorbital tissues and left parieto-occipital region. Large scalp hematoma. No subjacent calvarial fractures. No other acute osseous injury is seen. Sinuses/Orbits: Minimal thickening in the ethmoid air cells. No layering air-fluid levels or pneumatized secretions. Chronic bilateral mastoid effusions, right slightly greater than left with some mild hyperostotic features compatible with this chronicity. Appearance similar to comparison imaging. No visible or suspected temporal bone fractures. Middle ear cavities are clear. Other: Few carious lesions within the dentition. CT CERVICAL FINDINGS Alignment: Cervical stabilization collar is not visualized at the time of exam. There is straightening and slight reversal of the normal cervical lordosis which may be degenerative or on a positional basis given the positioning on scout view. Reversal is centered at the C5-6 level. No evidence of traumatic listhesis. No abnormally widened, perched or jumped facets. Normal alignment of the craniocervical and atlantoaxial articulations. Skull base and vertebrae: The osseous structures appear diffusely demineralized which may limit detection of small or nondisplaced fractures. No visible skull base fractures. Dens is intact. No vertebral body height loss. Fractures of the left C5, C6 and C7 transverse processes as well as fractures of the left T1 and possibly T2 transverse processes. Additional fracture of the right posterior first rib. Soft tissues and spinal canal: Soft tissue thickening and swelling adjacent the left  transverse process fractures with additional thickening along the paraspinal musculature including the left scalene levator muscle bellies. Additional contusive changes noted of the trapezius and rotator cuff musculature, better detailed on CT of the chest. Disc levels: Diffuse intervertebral disc height loss with multilevel spondylitic endplate changes throughout the cervical spine. Larger disc osteophyte complexes are present C3-4, C5-6, C6-7 and C7-T1 resulting in some mild to moderate canal stenoses. Additionally, questionable development of some ossification along the posterior longitudinal ligament at the C7-T1 and T1-T2 levels as well. Multilevel uncinate spurring and facet hypertrophic changes are present throughout the spine with moderate to severe narrowing bilaterally C3-4 and more mild to moderate changes more diffusely throughout the cervical levels. Other:  Cervical carotid atherosclerosis is present. CT CHEST FINDINGS Cardiovascular: The aortic root is suboptimally assessed given cardiac pulsation artifact. Atherosclerotic plaque within the normal caliber aorta. No acute luminal abnormality of the imaged aorta. No periaortic stranding or hemorrhage. 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 The right subclavian artery is also heavily calcified with some high-grade segmental stenosis proximally. Additional postsurgical changes noted near the level of the carotids albeit with some extensive native atheromatous plaque. No acute traumatic abnormality of the proximal great vessels are identified. Central pulmonary arteries are normal caliber. No large central filling defects on this non tailored examination of the pulmonary arteries. Normal cardiac size. Trace pericardial fluid is likely within physiologic normal. Three-vessel coronary artery  atherosclerosis is noted. No acute or worrisome major venous abnormalities are seen. Mediastinum/Nodes: No mediastinal fluid or gas. Diminutive appearance of the thyroid gland. Otherwise normal thoracic inlet. No acute abnormality of the trachea or  esophagus. No worrisome mediastinal, hilar or axillary adenopathy. Lungs/Pleura: Some minimal thickening is seen in the superior most portion of the superior segment of the left lower lobe adjacent the posterior left fourth rib fracture which could reflect some mild contusive change or atelectasis. No other acute traumatic abnormality of the lung parenchyma is evident. No pneumothorax. No effusion. No consolidative opacity. No convincing features of edema. Stable confluent opacity in the bilateral lung apices compatible with regions of chronic pleuroparenchymal scarring and some associated architectural distortion. Stable sub solid opacity in the superior segment left lower lobe measuring 5 mm in size (5/61). 5 mm subpleural solid nodule seen in the periphery of the right lower lobe (5/107). Musculoskeletal: Fracture of the distal head left clavicle with intra-articular extension, surrounding thickening and mild widening of the [facility] joint which could reflect. Cervical fractures better detailed above. Redemonstration of the transverse process fractures T1 and possibly T2. Suspect a nondisplaced fracture of the posterior left third, fifth and sixth ribs, minimally displaced fracture of the left fourth rib posteriorly. Additional fractures of the posterior right first rib and third rib near the transverse process. No other acute traumatic osseous injury of the chest wall or imaged thoracic spine. Minimal soft tissue thickening and intercostal thickening adjacent the rib fractures. Background of diffuse multilevel discogenic and facet degenerative changes throughout the thoracic spine including some mild scoliotic curvature. Additional arthrosis bilateral shoulders. Nerve  stimulator battery pack seen in the soft tissues of the right flank, leads entering the canal at the T10-11 level, and terminating posterior to the T8-9 vertebrae. CT ABDOMEN PELVIS FINDINGS Hepatobiliary: No direct hepatic injury or perihepatic hematoma. No focal liver abnormality is seen. Patient is post cholecystectomy. Slight prominence of the biliary tree likely related to reservoir effect. No calcified intraductal gallstones. Pancreas: No pancreatic contusive change or ductal disruption. No pancreatic ductal dilatation or surrounding inflammatory changes. Spleen: No direct splenic injury or perisplenic hemorrhage. Normal in size. No concerning splenic lesions. Adrenals/Urinary Tract: No convincing adrenal hemorrhage or suspicious adrenal lesion. Mild lobular thickening of the adrenal glands may be senescent hyperplasia. No direct renal injury or perinephric hemorrhage. Kidneys are normally located with symmetric enhancementand excretion without extravasation of contrast on the excretory delayed phase imaging. No suspicious renal lesion, urolithiasis or hydronephrosis. No evidence of direct bladder injury, rupture, or other acute bladder abnormality. Stomach/Bowel: Distal esophagus, stomach and duodenum are unremarkable. No focal small bowel thickening or dilatation. Vascular/Lymphatic: No evidence of direct vascular injury in the abdomen or pelvis. Extensive atherosclerotic calcification throughout the aorta and branch vessels. No aneurysm or ectasia. No suspicious or enlarged lymph nodes in the included lymphatic chains. Reproductive: Normal appearance of the uterus and adnexal structures. Other: Focal subcutaneous soft tissue thickening in the bilateral lower quadrants, left greater than right may reflect sequela of prior injectable use. More contusive appearing soft tissue thickening and stranding is seen lateral to the left hip. No abdominopelvic free air or fluid. No bowel containing hernias.  Musculoskeletal: No acute fracture or vertebral body height loss of the lumbar spine. Congenital nonfusion right L1 transverse process, benign variant. Mild levocurvature of the lumbar levels, apex L4. No traumatic listhesis. Mild retrolisthesis L4 on 5 is likely degenerative with advanced discogenic and facet degenerative changes at these levels. Additional diffuse degenerative changes throughout the lumbar spine maximal L5-S1. Bony pelvis is intact and congruent. Proximal femora are intact and normally located in the acetabula. Suspect some enthesopathic mineralization near the left lesser trochanter. Few benign bone islands  throughout the pelvis. Musculature is normal and symmetric. IMPRESSION: CT head: 1. Several sites of scalp contusion/swelling including the left frontal/supraorbital tissues and left parieto-occipital region. No subjacent calvarial fractures. 2. No acute intracranial abnormality. 3. Chronic bilateral mastoid effusions, right slightly greater than left, with some mild hyperostotic features compatible with this chronicity. No visible or suspected temporal bone fractures. CT cervical spine: 1. Fractures of the left C5, C6 and C7 transverse processes coursing in close proximity but clearly into the transverse foramina. 2. Stranding and thickening adjacent the transverse process fractures as well as some thickening and stranding about the left paraspinal musculature. 3. No clear vertebral body fracture or height loss is seen. No traumatic listhesis. 4. 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 5. CT chest, abdomen pelvis. 1. Fractures of the left T1 and possibly T2 transverse processes. No other acute osseous injury of the thoracic or lumbar spine. 2. Suspect a nondisplaced fracture of the posterior left third, fifth and sixth ribs. Minimally displaced fracture of the left fourth rib posteriorly.  Fractures of the posterior right first rib and third rib near the transverse process. No pneumothorax. Some minimal ground-glass adjacent the fourth rib fracture in the superior segment left lower lobe may reflect trace pulmonary contusion or atelectasis. 3. Fracture of the distal head left clavicle with intra-articular extension, surrounding thickening and mild widening of the AC joint. Small amount of stranding and trace hemorrhage adjacent the clavicular fracture but without clear vascular injury identified within the limitations of this exam. 4. Contusive changes of the left hip. No large hematoma or site of active contrast extravasation. 5. 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 Additional high-grade segmental. 6. Focal skin thickening and subcutaneous soft tissue along the anterior abdominal wall, likely related to injectable use/insulin administration. 7. Prior cholecystectomy. 8. Aortic Atherosclerosis (ICD10-I70.0). These results were called by telephone at the time of interpretation on 03/07/2020 at 11:42 pm to provider [facility] , who verbally acknowledged these results. Electronically Signed   By: Lovena Le M.D.   On: 03/07/2020 23:43   CT Chest W Contrast  Result Date: 03/07/2020 CLINICAL DATA:  Fall down 5 steps, head injury laceration, left chest and shoulder pain, no loss of consciousness EXAM: CT HEAD WITHOUT CONTRAST CT CERVICAL SPINE WITHOUT CONTRAST CT CHEST, ABDOMEN AND PELVIS WITH CONTRAST TECHNIQUE: Contiguous axial images were obtained from the base of the skull through the vertex without intravenous contrast. Multidetector CT imaging of the cervical spine was performed without intravenous contrast. Multiplanar CT image reconstructions were also generated. Multidetector CT  imaging of the chest, abdomen and pelvis was performed following the standard protocol during bolus administration of intravenous contrast. CONTRAST:  161mL OMNIPAQUE IOHEXOL 300 MG/ML  SOLN COMPARISON:  CT head 02/04/2020, CTA head neck 11/25/2019 FINDINGS: CT HEAD FINDINGS Brain: No evidence of acute infarction, hemorrhage, hydrocephalus, extra-axial collection, visible mass lesion or mass effect. Vascular: Atherosclerotic calcification of the carotid siphons. No hyperdense vessel. Skull: Several sites of scalp contusion/swelling including the left frontal/supraorbital tissues and left parieto-occipital region. Large scalp hematoma. No subjacent calvarial fractures. No other acute osseous injury is seen. Sinuses/Orbits: Minimal thickening in the ethmoid air cells. No layering air-fluid levels or pneumatized secretions. Chronic bilateral mastoid effusions, right slightly greater than left  with some mild hyperostotic features compatible with this chronicity. Appearance similar to comparison imaging. No visible or suspected temporal bone fractures. Middle ear cavities are clear. Other: Few carious lesions within the dentition. CT CERVICAL FINDINGS Alignment: Cervical stabilization collar is not visualized at the time of exam. There is straightening and slight reversal of the normal cervical lordosis which may be degenerative or on a positional basis given the positioning on scout view. Reversal is centered at the C5-6 level. No evidence of traumatic listhesis. No abnormally widened, perched or jumped facets. Normal alignment of the craniocervical and atlantoaxial articulations. Skull base and vertebrae: The osseous structures appear diffusely demineralized which may limit detection of small or nondisplaced fractures. No visible skull base fractures. Dens is intact. No vertebral body height loss. Fractures of the left C5, C6 and C7 transverse processes as well as fractures of the left T1 and possibly T2 transverse  processes. Additional fracture of the right posterior first rib. Soft tissues and spinal canal: Soft tissue thickening and swelling adjacent the left transverse process fractures with additional thickening along the paraspinal musculature including the left scalene levator muscle bellies. Additional contusive changes noted of the trapezius and rotator cuff musculature, better detailed on CT of the chest. Disc levels: Diffuse intervertebral disc height loss with multilevel spondylitic endplate changes throughout the cervical spine. Larger disc osteophyte complexes are present C3-4, C5-6, C6-7 and C7-T1 resulting in some mild to moderate canal stenoses. Additionally, questionable development of some ossification along the posterior longitudinal ligament at the C7-T1 and T1-T2 levels as well. Multilevel uncinate spurring and facet hypertrophic changes are present throughout the spine with moderate to severe narrowing bilaterally C3-4 and more mild to moderate changes more diffusely throughout the cervical levels. Other:  Cervical carotid atherosclerosis is present. CT CHEST FINDINGS Cardiovascular: The aortic root is suboptimally assessed given cardiac pulsation artifact. Atherosclerotic plaque within the normal caliber aorta. No acute luminal abnormality of the imaged aorta. No periaortic stranding or hemorrhage. 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 The right subclavian artery is also heavily calcified with some high-grade segmental stenosis proximally. Additional postsurgical changes noted near the level of the carotids albeit with some extensive native atheromatous plaque. No acute traumatic abnormality of the proximal great vessels are identified. Central pulmonary arteries are normal caliber. No large central filling defects on this non tailored  examination of the pulmonary arteries. Normal cardiac size. Trace pericardial fluid is likely within physiologic normal. Three-vessel coronary artery atherosclerosis is noted. No acute or worrisome major venous abnormalities are seen. Mediastinum/Nodes: No mediastinal fluid or gas. Diminutive appearance of the thyroid gland. Otherwise normal thoracic inlet. No acute abnormality of the trachea or esophagus. No worrisome mediastinal, hilar or axillary adenopathy. Lungs/Pleura: Some minimal thickening is seen in the superior most portion of the superior segment of the left lower lobe adjacent the posterior left fourth rib fracture which could reflect some mild contusive change or atelectasis. No other acute traumatic abnormality of the lung parenchyma is evident. No pneumothorax. No effusion. No consolidative opacity. No convincing features of edema. Stable confluent opacity in the bilateral lung apices compatible with regions of chronic pleuroparenchymal scarring and some associated architectural distortion. Stable sub solid opacity in the superior segment left lower lobe measuring 5 mm in size (5/61). 5 mm subpleural solid nodule seen in the periphery of the right lower  lobe (5/107). Musculoskeletal: Fracture of the distal head left clavicle with intra-articular extension, surrounding thickening and mild widening of the [facility] joint which could reflect. Cervical fractures better detailed above. Redemonstration of the transverse process fractures T1 and possibly T2. Suspect a nondisplaced fracture of the posterior left third, fifth and sixth ribs, minimally displaced fracture of the left fourth rib posteriorly. Additional fractures of the posterior right first rib and third rib near the transverse process. No other acute traumatic osseous injury of the chest wall or imaged thoracic spine. Minimal soft tissue thickening and intercostal thickening adjacent the rib fractures. Background of diffuse multilevel discogenic and  facet degenerative changes throughout the thoracic spine including some mild scoliotic curvature. Additional arthrosis bilateral shoulders. Nerve stimulator battery pack seen in the soft tissues of the right flank, leads entering the canal at the T10-11 level, and terminating posterior to the T8-9 vertebrae. CT ABDOMEN PELVIS FINDINGS Hepatobiliary: No direct hepatic injury or perihepatic hematoma. No focal liver abnormality is seen. Patient is post cholecystectomy. Slight prominence of the biliary tree likely related to reservoir effect. No calcified intraductal gallstones. Pancreas: No pancreatic contusive change or ductal disruption. No pancreatic ductal dilatation or surrounding inflammatory changes. Spleen: No direct splenic injury or perisplenic hemorrhage. Normal in size. No concerning splenic lesions. Adrenals/Urinary Tract: No convincing adrenal hemorrhage or suspicious adrenal lesion. Mild lobular thickening of the adrenal glands may be senescent hyperplasia. No direct renal injury or perinephric hemorrhage. Kidneys are normally located with symmetric enhancementand excretion without extravasation of contrast on the excretory delayed phase imaging. No suspicious renal lesion, urolithiasis or hydronephrosis. No evidence of direct bladder injury, rupture, or other acute bladder abnormality. Stomach/Bowel: Distal esophagus, stomach and duodenum are unremarkable. No focal small bowel thickening or dilatation. Vascular/Lymphatic: No evidence of direct vascular injury in the abdomen or pelvis. Extensive atherosclerotic calcification throughout the aorta and branch vessels. No aneurysm or ectasia. No suspicious or enlarged lymph nodes in the included lymphatic chains. Reproductive: Normal appearance of the uterus and adnexal structures. Other: Focal subcutaneous soft tissue thickening in the bilateral lower quadrants, left greater than right may reflect sequela of prior injectable use. More contusive appearing  soft tissue thickening and stranding is seen lateral to the left hip. No abdominopelvic free air or fluid. No bowel containing hernias. Musculoskeletal: No acute fracture or vertebral body height loss of the lumbar spine. Congenital nonfusion right L1 transverse process, benign variant. Mild levocurvature of the lumbar levels, apex L4. No traumatic listhesis. Mild retrolisthesis L4 on 5 is likely degenerative with advanced discogenic and facet degenerative changes at these levels. Additional diffuse degenerative changes throughout the lumbar spine maximal L5-S1. Bony pelvis is intact and congruent. Proximal femora are intact and normally located in the acetabula. Suspect some enthesopathic mineralization near the left lesser trochanter. Few benign bone islands throughout the pelvis. Musculature is normal and symmetric. IMPRESSION: CT head: 1. Several sites of scalp contusion/swelling including the left frontal/supraorbital tissues and left parieto-occipital region. No subjacent calvarial fractures. 2. No acute intracranial abnormality. 3. Chronic bilateral mastoid effusions, right slightly greater than left, with some mild hyperostotic features compatible with this chronicity. No visible or suspected temporal bone fractures. CT cervical spine: 1. Fractures of the left C5, C6 and C7 transverse processes coursing in close proximity but clearly into the transverse foramina. 2. Stranding and thickening adjacent the transverse process fractures as well as some thickening and stranding about the left paraspinal musculature. 3. No clear vertebral body fracture or height loss is seen.  No traumatic listhesis. 4. 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 5. CT chest, abdomen pelvis. 1. Fractures of the left T1 and possibly T2 transverse processes. No other acute osseous injury of the thoracic or lumbar spine. 2. Suspect a  nondisplaced fracture of the posterior left third, fifth and sixth ribs. Minimally displaced fracture of the left fourth rib posteriorly. Fractures of the posterior right first rib and third rib near the transverse process. No pneumothorax. Some minimal ground-glass adjacent the fourth rib fracture in the superior segment left lower lobe may reflect trace pulmonary contusion or atelectasis. 3. Fracture of the distal head left clavicle with intra-articular extension, surrounding thickening and mild widening of the AC joint. Small amount of stranding and trace hemorrhage adjacent the clavicular fracture but without clear vascular injury identified within the limitations of this exam. 4. Contusive changes of the left hip. No large hematoma or site of active contrast extravasation. 5. 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 Additional high-grade segmental. 6. Focal skin thickening and subcutaneous soft tissue along the anterior abdominal wall, likely related to injectable use/insulin administration. 7. Prior cholecystectomy. 8. Aortic Atherosclerosis (ICD10-I70.0). These results were called by telephone at the time of interpretation on 03/07/2020 at 11:42 pm to provider [facility] , who verbally acknowledged these results. Electronically Signed   By: Lovena Le M.D.   On: 03/07/2020 23:43   CT Cervical Spine Wo Contrast  Result Date: 03/07/2020 CLINICAL DATA:  Fall down 5 steps, head injury laceration, left chest and shoulder pain, no loss of consciousness EXAM: CT HEAD WITHOUT CONTRAST CT CERVICAL SPINE WITHOUT CONTRAST CT CHEST, ABDOMEN AND PELVIS WITH CONTRAST TECHNIQUE: Contiguous axial images were obtained from the base of the skull through the vertex without intravenous contrast. Multidetector CT  imaging of the cervical spine was performed without intravenous contrast. Multiplanar CT image reconstructions were also generated. Multidetector CT imaging of the chest, abdomen and pelvis was performed following the standard protocol during bolus administration of intravenous contrast. CONTRAST:  187mL OMNIPAQUE IOHEXOL 300 MG/ML  SOLN COMPARISON:  CT head 02/04/2020, CTA head neck 11/25/2019 FINDINGS: CT HEAD FINDINGS Brain: No evidence of acute infarction, hemorrhage, hydrocephalus, extra-axial collection, visible mass lesion or mass effect. Vascular: Atherosclerotic calcification of the carotid siphons. No hyperdense vessel. Skull: Several sites of scalp contusion/swelling including the left frontal/supraorbital tissues and left parieto-occipital region. Large scalp hematoma. No subjacent calvarial fractures. No other acute osseous injury is seen. Sinuses/Orbits: Minimal thickening in the ethmoid air cells. No layering air-fluid levels or pneumatized secretions. Chronic bilateral mastoid effusions, right slightly greater than left with some mild hyperostotic features compatible with this chronicity. Appearance similar to comparison imaging. No visible or suspected temporal bone fractures. Middle ear cavities are clear. Other: Few carious lesions within the dentition. CT CERVICAL FINDINGS Alignment: Cervical stabilization collar is not visualized at the time of exam. There is straightening and slight reversal of the normal cervical lordosis which may be degenerative or on a positional basis given the positioning on scout view. Reversal is centered at the C5-6 level. No evidence of traumatic listhesis. No abnormally widened, perched or jumped facets. Normal alignment of the craniocervical and atlantoaxial articulations. Skull base and vertebrae: The osseous structures appear diffusely demineralized which may limit detection of  small or nondisplaced fractures. No visible skull base fractures. Dens is intact. No  vertebral body height loss. Fractures of the left C5, C6 and C7 transverse processes as well as fractures of the left T1 and possibly T2 transverse processes. Additional fracture of the right posterior first rib. Soft tissues and spinal canal: Soft tissue thickening and swelling adjacent the left transverse process fractures with additional thickening along the paraspinal musculature including the left scalene levator muscle bellies. Additional contusive changes noted of the trapezius and rotator cuff musculature, better detailed on CT of the chest. Disc levels: Diffuse intervertebral disc height loss with multilevel spondylitic endplate changes throughout the cervical spine. Larger disc osteophyte complexes are present C3-4, C5-6, C6-7 and C7-T1 resulting in some mild to moderate canal stenoses. Additionally, questionable development of some ossification along the posterior longitudinal ligament at the C7-T1 and T1-T2 levels as well. Multilevel uncinate spurring and facet hypertrophic changes are present throughout the spine with moderate to severe narrowing bilaterally C3-4 and more mild to moderate changes more diffusely throughout the cervical levels. Other:  Cervical carotid atherosclerosis is present. CT CHEST FINDINGS Cardiovascular: The aortic root is suboptimally assessed given cardiac pulsation artifact. Atherosclerotic plaque within the normal caliber aorta. No acute luminal abnormality of the imaged aorta. No periaortic stranding or hemorrhage. 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 The right subclavian artery is also heavily calcified with some high-grade segmental stenosis proximally. Additional postsurgical changes noted near the level of the carotids albeit with some extensive native atheromatous plaque. No acute traumatic abnormality  of the proximal great vessels are identified. Central pulmonary arteries are normal caliber. No large central filling defects on this non tailored examination of the pulmonary arteries. Normal cardiac size. Trace pericardial fluid is likely within physiologic normal. Three-vessel coronary artery atherosclerosis is noted. No acute or worrisome major venous abnormalities are seen. Mediastinum/Nodes: No mediastinal fluid or gas. Diminutive appearance of the thyroid gland. Otherwise normal thoracic inlet. No acute abnormality of the trachea or esophagus. No worrisome mediastinal, hilar or axillary adenopathy. Lungs/Pleura: Some minimal thickening is seen in the superior most portion of the superior segment of the left lower lobe adjacent the posterior left fourth rib fracture which could reflect some mild contusive change or atelectasis. No other acute traumatic abnormality of the lung parenchyma is evident. No pneumothorax. No effusion. No consolidative opacity. No convincing features of edema. Stable confluent opacity in the bilateral lung apices compatible with regions of chronic pleuroparenchymal scarring and some associated architectural distortion. Stable sub solid opacity in the superior segment left lower lobe measuring 5 mm in size (5/61). 5 mm subpleural solid nodule seen in the periphery of the right lower lobe (5/107). Musculoskeletal: Fracture of the distal head left clavicle with intra-articular extension, surrounding thickening and mild widening of the [facility] joint which could reflect. Cervical fractures better detailed above. Redemonstration of the transverse process fractures T1 and possibly T2. Suspect a nondisplaced fracture of the posterior left third, fifth and sixth ribs, minimally displaced fracture of the left fourth rib posteriorly. Additional fractures of the posterior right first rib and third rib near the transverse process. No other acute traumatic osseous injury of the chest wall or imaged  thoracic spine. Minimal soft tissue thickening and intercostal thickening adjacent the rib fractures. Background of diffuse multilevel discogenic and facet degenerative changes throughout the thoracic spine  including some mild scoliotic curvature. Additional arthrosis bilateral shoulders. Nerve stimulator battery pack seen in the soft tissues of the right flank, leads entering the canal at the T10-11 level, and terminating posterior to the T8-9 vertebrae. CT ABDOMEN PELVIS FINDINGS Hepatobiliary: No direct hepatic injury or perihepatic hematoma. No focal liver abnormality is seen. Patient is post cholecystectomy. Slight prominence of the biliary tree likely related to reservoir effect. No calcified intraductal gallstones. Pancreas: No pancreatic contusive change or ductal disruption. No pancreatic ductal dilatation or surrounding inflammatory changes. Spleen: No direct splenic injury or perisplenic hemorrhage. Normal in size. No concerning splenic lesions. Adrenals/Urinary Tract: No convincing adrenal hemorrhage or suspicious adrenal lesion. Mild lobular thickening of the adrenal glands may be senescent hyperplasia. No direct renal injury or perinephric hemorrhage. Kidneys are normally located with symmetric enhancementand excretion without extravasation of contrast on the excretory delayed phase imaging. No suspicious renal lesion, urolithiasis or hydronephrosis. No evidence of direct bladder injury, rupture, or other acute bladder abnormality. Stomach/Bowel: Distal esophagus, stomach and duodenum are unremarkable. No focal small bowel thickening or dilatation. Vascular/Lymphatic: No evidence of direct vascular injury in the abdomen or pelvis. Extensive atherosclerotic calcification throughout the aorta and branch vessels. No aneurysm or ectasia. No suspicious or enlarged lymph nodes in the included lymphatic chains. Reproductive: Normal appearance of the uterus and adnexal structures. Other: Focal subcutaneous  soft tissue thickening in the bilateral lower quadrants, left greater than right may reflect sequela of prior injectable use. More contusive appearing soft tissue thickening and stranding is seen lateral to the left hip. No abdominopelvic free air or fluid. No bowel containing hernias. Musculoskeletal: No acute fracture or vertebral body height loss of the lumbar spine. Congenital nonfusion right L1 transverse process, benign variant. Mild levocurvature of the lumbar levels, apex L4. No traumatic listhesis. Mild retrolisthesis L4 on 5 is likely degenerative with advanced discogenic and facet degenerative changes at these levels. Additional diffuse degenerative changes throughout the lumbar spine maximal L5-S1. Bony pelvis is intact and congruent. Proximal femora are intact and normally located in the acetabula. Suspect some enthesopathic mineralization near the left lesser trochanter. Few benign bone islands throughout the pelvis. Musculature is normal and symmetric. IMPRESSION: CT head: 1. Several sites of scalp contusion/swelling including the left frontal/supraorbital tissues and left parieto-occipital region. No subjacent calvarial fractures. 2. No acute intracranial abnormality. 3. Chronic bilateral mastoid effusions, right slightly greater than left, with some mild hyperostotic features compatible with this chronicity. No visible or suspected temporal bone fractures. CT cervical spine: 1. Fractures of the left C5, C6 and C7 transverse processes coursing in close proximity but clearly into the transverse foramina. 2. Stranding and thickening adjacent the transverse process fractures as well as some thickening and stranding about the left paraspinal musculature. 3. No clear vertebral body fracture or height loss is seen. No traumatic listhesis. 4. 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 5. CT chest, abdomen pelvis.  1. Fractures of the left T1 and possibly T2 transverse processes. No other acute osseous injury of the thoracic or lumbar spine. 2. Suspect a nondisplaced fracture of the posterior left third, fifth and sixth ribs. Minimally displaced fracture of the left fourth rib posteriorly. Fractures of the posterior right first rib and third rib near the transverse process. No pneumothorax. Some minimal ground-glass adjacent the fourth rib fracture in the superior segment left lower lobe may reflect trace pulmonary contusion or atelectasis. 3. Fracture of the distal  head left clavicle with intra-articular extension, surrounding thickening and mild widening of the AC joint. Small amount of stranding and trace hemorrhage adjacent the clavicular fracture but without clear vascular injury identified within the limitations of this exam. 4. Contusive changes of the left hip. No large hematoma or site of active contrast extravasation. 5. 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 Additional high-grade segmental. 6. Focal skin thickening and subcutaneous soft tissue along the anterior abdominal wall, likely related to injectable use/insulin administration. 7. Prior cholecystectomy. 8. Aortic Atherosclerosis (ICD10-I70.0). These results were called by telephone at the time of interpretation on 03/07/2020 at 11:42 pm to provider [facility] , who verbally acknowledged these results. Electronically Signed   By: Lovena Le M.D.   On: 03/07/2020 23:43   CT Abdomen Pelvis W Contrast  Result Date: 03/07/2020 CLINICAL DATA:  Fall down 5 steps, head injury laceration, left chest and shoulder pain, no loss of consciousness EXAM: CT HEAD WITHOUT CONTRAST CT CERVICAL SPINE WITHOUT CONTRAST CT CHEST, ABDOMEN AND PELVIS WITH CONTRAST  TECHNIQUE: Contiguous axial images were obtained from the base of the skull through the vertex without intravenous contrast. Multidetector CT imaging of the cervical spine was performed without intravenous contrast. Multiplanar CT image reconstructions were also generated. Multidetector CT imaging of the chest, abdomen and pelvis was performed following the standard protocol during bolus administration of intravenous contrast. CONTRAST:  141mL OMNIPAQUE IOHEXOL 300 MG/ML  SOLN COMPARISON:  CT head 02/04/2020, CTA head neck 11/25/2019 FINDINGS: CT HEAD FINDINGS Brain: No evidence of acute infarction, hemorrhage, hydrocephalus, extra-axial collection, visible mass lesion or mass effect. Vascular: Atherosclerotic calcification of the carotid siphons. No hyperdense vessel. Skull: Several sites of scalp contusion/swelling including the left frontal/supraorbital tissues and left parieto-occipital region. Large scalp hematoma. No subjacent calvarial fractures. No other acute osseous injury is seen. Sinuses/Orbits: Minimal thickening in the ethmoid air cells. No layering air-fluid levels or pneumatized secretions. Chronic bilateral mastoid effusions, right slightly greater than left with some mild hyperostotic features compatible with this chronicity. Appearance similar to comparison imaging. No visible or suspected temporal bone fractures. Middle ear cavities are clear. Other: Few carious lesions within the dentition. CT CERVICAL FINDINGS Alignment: Cervical stabilization collar is not visualized at the time of exam. There is straightening and slight reversal of the normal cervical lordosis which may be degenerative or on a positional basis given the positioning on scout view. Reversal is centered at the C5-6 level. No evidence of traumatic listhesis. No abnormally widened, perched or jumped facets. Normal alignment of the craniocervical and atlantoaxial articulations. Skull base and vertebrae: The osseous structures  appear diffusely demineralized which may limit detection of small or nondisplaced fractures. No visible skull base fractures. Dens is intact. No vertebral body height loss. Fractures of the left C5, C6 and C7 transverse processes as well as fractures of the left T1 and possibly T2 transverse processes. Additional fracture of the right posterior first rib. Soft tissues and spinal canal: Soft tissue thickening and swelling adjacent the left transverse process fractures with additional thickening along the paraspinal musculature including the left scalene levator muscle bellies. Additional contusive changes noted of the trapezius and rotator cuff musculature, better detailed on CT of the chest. Disc levels: Diffuse intervertebral disc height loss with multilevel spondylitic endplate changes throughout the cervical spine. Larger disc osteophyte  complexes are present C3-4, C5-6, C6-7 and C7-T1 resulting in some mild to moderate canal stenoses. Additionally, questionable development of some ossification along the posterior longitudinal ligament at the C7-T1 and T1-T2 levels as well. Multilevel uncinate spurring and facet hypertrophic changes are present throughout the spine with moderate to severe narrowing bilaterally C3-4 and more mild to moderate changes more diffusely throughout the cervical levels. Other:  Cervical carotid atherosclerosis is present. CT CHEST FINDINGS Cardiovascular: The aortic root is suboptimally assessed given cardiac pulsation artifact. Atherosclerotic plaque within the normal caliber aorta. No acute luminal abnormality of the imaged aorta. No periaortic stranding or hemorrhage. 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 The right subclavian artery is also heavily calcified with some high-grade segmental stenosis proximally.  Additional postsurgical changes noted near the level of the carotids albeit with some extensive native atheromatous plaque. No acute traumatic abnormality of the proximal great vessels are identified. Central pulmonary arteries are normal caliber. No large central filling defects on this non tailored examination of the pulmonary arteries. Normal cardiac size. Trace pericardial fluid is likely within physiologic normal. Three-vessel coronary artery atherosclerosis is noted. No acute or worrisome major venous abnormalities are seen. Mediastinum/Nodes: No mediastinal fluid or gas. Diminutive appearance of the thyroid gland. Otherwise normal thoracic inlet. No acute abnormality of the trachea or esophagus. No worrisome mediastinal, hilar or axillary adenopathy. Lungs/Pleura: Some minimal thickening is seen in the superior most portion of the superior segment of the left lower lobe adjacent the posterior left fourth rib fracture which could reflect some mild contusive change or atelectasis. No other acute traumatic abnormality of the lung parenchyma is evident. No pneumothorax. No effusion. No consolidative opacity. No convincing features of edema. Stable confluent opacity in the bilateral lung apices compatible with regions of chronic pleuroparenchymal scarring and some associated architectural distortion. Stable sub solid opacity in the superior segment left lower lobe measuring 5 mm in size (5/61). 5 mm subpleural solid nodule seen in the periphery of the right lower lobe (5/107). Musculoskeletal: Fracture of the distal head left clavicle with intra-articular extension, surrounding thickening and mild widening of the [facility] joint which could reflect. Cervical fractures better detailed above. Redemonstration of the transverse process fractures T1 and possibly T2. Suspect a nondisplaced fracture of the posterior left third, fifth and sixth ribs, minimally displaced fracture of the left fourth rib posteriorly. Additional  fractures of the posterior right first rib and third rib near the transverse process. No other acute traumatic osseous injury of the chest wall or imaged thoracic spine. Minimal soft tissue thickening and intercostal thickening adjacent the rib fractures. Background of diffuse multilevel discogenic and facet degenerative changes throughout the thoracic spine including some mild scoliotic curvature. Additional arthrosis bilateral shoulders. Nerve stimulator battery pack seen in the soft tissues of the right flank, leads entering the canal at the T10-11 level, and terminating posterior to the T8-9 vertebrae. CT ABDOMEN PELVIS FINDINGS Hepatobiliary: No direct hepatic injury or perihepatic hematoma. No focal liver abnormality is seen. Patient is post cholecystectomy. Slight prominence of the biliary tree likely related to reservoir effect. No calcified intraductal gallstones. Pancreas: No pancreatic contusive change or ductal disruption. No pancreatic ductal dilatation or surrounding inflammatory changes. Spleen: No direct splenic injury or perisplenic hemorrhage. Normal in size. No concerning splenic lesions. Adrenals/Urinary Tract: No convincing adrenal hemorrhage or suspicious adrenal lesion. Mild  lobular thickening of the adrenal glands may be senescent hyperplasia. No direct renal injury or perinephric hemorrhage. Kidneys are normally located with symmetric enhancementand excretion without extravasation of contrast on the excretory delayed phase imaging. No suspicious renal lesion, urolithiasis or hydronephrosis. No evidence of direct bladder injury, rupture, or other acute bladder abnormality. Stomach/Bowel: Distal esophagus, stomach and duodenum are unremarkable. No focal small bowel thickening or dilatation. Vascular/Lymphatic: No evidence of direct vascular injury in the abdomen or pelvis. Extensive atherosclerotic calcification throughout the aorta and branch vessels. No aneurysm or ectasia. No suspicious or  enlarged lymph nodes in the included lymphatic chains. Reproductive: Normal appearance of the uterus and adnexal structures. Other: Focal subcutaneous soft tissue thickening in the bilateral lower quadrants, left greater than right may reflect sequela of prior injectable use. More contusive appearing soft tissue thickening and stranding is seen lateral to the left hip. No abdominopelvic free air or fluid. No bowel containing hernias. Musculoskeletal: No acute fracture or vertebral body height loss of the lumbar spine. Congenital nonfusion right L1 transverse process, benign variant. Mild levocurvature of the lumbar levels, apex L4. No traumatic listhesis. Mild retrolisthesis L4 on 5 is likely degenerative with advanced discogenic and facet degenerative changes at these levels. Additional diffuse degenerative changes throughout the lumbar spine maximal L5-S1. Bony pelvis is intact and congruent. Proximal femora are intact and normally located in the acetabula. Suspect some enthesopathic mineralization near the left lesser trochanter. Few benign bone islands throughout the pelvis. Musculature is normal and symmetric. IMPRESSION: CT head: 1. Several sites of scalp contusion/swelling including the left frontal/supraorbital tissues and left parieto-occipital region. No subjacent calvarial fractures. 2. No acute intracranial abnormality. 3. Chronic bilateral mastoid effusions, right slightly greater than left, with some mild hyperostotic features compatible with this chronicity. No visible or suspected temporal bone fractures. CT cervical spine: 1. Fractures of the left C5, C6 and C7 transverse processes coursing in close proximity but clearly into the transverse foramina. 2. Stranding and thickening adjacent the transverse process fractures as well as some thickening and stranding about the left paraspinal musculature. 3. No clear vertebral body fracture or height loss is seen. No traumatic listhesis. 4. Multilevel  cervical spondylitic changes resulting some mild to moderate multilevel spinal canal and foraminal stenoses as described above. 5. CT chest, abdomen pelvis. 1. Fractures of the left T1 and possibly T2 transverse processes. No other acute osseous injury of the thoracic or lumbar spine. 2. Suspect a nondisplaced fracture of the posterior left third, fifth and sixth ribs. Minimally displaced fracture of the left fourth rib posteriorly. Fractures of the posterior right first rib and third rib near the transverse process. No pneumothorax. Some minimal ground-glass adjacent the fourth rib fracture in the superior segment left lower lobe may reflect trace pulmonary contusion or atelectasis. 3. Fracture of the distal head left clavicle with intra-articular extension, surrounding thickening and mild widening of the AC joint. Small amount of stranding and trace hemorrhage adjacent the clavicular fracture but without clear vascular injury identified within the limitations of this exam. 4. Contusive changes of the left hip. No large hematoma or site of active contrast extravasation. 5. 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 Additional high-grade segmental. 6. Focal skin thickening and subcutaneous soft tissue along the anterior abdominal wall, likely related to injectable use/insulin administration. 7. Prior  cholecystectomy. 8. Aortic Atherosclerosis (ICD10-I70.0). These results were called by telephone at the time of interpretation on 03/07/2020 at 11:42 pm to provider [facility] , who verbally acknowledged these results. Electronically Signed   By: Lovena Le M.D.   On: 03/07/2020 23:43   DG Shoulder Left  Result Date: 03/07/2020 CLINICAL DATA:  Pain status post fall EXAM: LEFT SHOULDER - 2+ VIEW COMPARISON:   None. FINDINGS: There is an acute, nondisplaced fracture of the distal left clavicle. There are degenerative changes of the acromioclavicular joint and left glenohumeral joint. There is calcific tendinopathy of the rotator cuff. There is no glenohumeral dislocation. IMPRESSION: 1. Acute, nondisplaced fracture of the distal left clavicle. 2. Degenerative changes of the acromioclavicular joint and left glenohumeral joint. 3. Calcific tendinopathy of the rotator cuff. Electronically Signed   By: Constance Holster M.D.   On: 03/07/2020 23:02   DG Hip Unilat W or Wo Pelvis 2-3 Views Right  Result Date: 03/07/2020 CLINICAL DATA:  Pain status post fall EXAM: DG HIP (WITH OR WITHOUT PELVIS) 2-3V RIGHT COMPARISON:  None. FINDINGS: There is no evidence of hip fracture or dislocation. There is no evidence of arthropathy or other focal bone abnormality. IMPRESSION: Negative. Electronically Signed   By: Constance Holster M.D.   On: 03/07/2020 23:06    Review of Systems  Constitutional: Negative for fever.  Respiratory: Negative for shortness of breath and stridor.   Cardiovascular: Negative for chest pain.  Gastrointestinal: Negative for abdominal pain, nausea and vomiting.  Allergic/Immunologic: Negative for immunocompromised state.  Neurological: Positive for seizures. Negative for speech difficulty.  Psychiatric/Behavioral: Negative for agitation and confusion.    Blood pressure 97/62, pulse 82, temperature 98.6 F (37 C), temperature source Oral, resp. rate 16, height 5\' 7"  (1.702 m), weight 86.2 kg, SpO2 94 %. Physical Exam Vitals reviewed.  Constitutional:      General: She is not in acute distress.    Appearance: Normal appearance.  HENT:     Head: Normocephalic and atraumatic.     Comments: Cervical collar in place    Nose: Nose normal.  Eyes:     General: No scleral icterus.    Extraocular Movements: Extraocular movements intact.     Pupils: Pupils are equal, round, and reactive to  light.  Cardiovascular:     Rate and Rhythm: Normal rate and regular rhythm.     Heart sounds: Normal heart sounds.  Pulmonary:     Effort: Pulmonary effort is normal. No respiratory distress.     Comments: Bilateral expiratory wheezing Abdominal:     General: There is no distension.     Palpations: Abdomen is soft.     Comments: Mild tenderness to palpation in LUQ/left lower chest wall  Musculoskeletal:        General: No deformity.     Comments: LUE in sling  Skin:    General: Skin is warm and dry.     Coloration: Skin is not jaundiced.     Comments: Superficial skin tears on the right forearm and upper arm  Neurological:     General: No focal deficit present.     Mental Status: She is alert and oriented to person, place, and time.     Cranial Nerves: No cranial nerve deficit.  Psychiatric:        Mood and Affect: Mood normal.        Behavior: Behavior normal.        Thought Content: Thought content normal.  Assessment/Plan 59 yo female presenting after a fall down stairs with multiple injuries: L C5-7 transverse process fractures L T1 and T2 transverse process fractures L 3,5 and 6 rib fractures R 1 and 3 rib fractures L clavicle fracture  - Ortho consulted for clavicle fracture. Nonweight bearing LUQ, sling in place. - Neurosurgery consult for spine fractures. Maintain C collar. - Home medications as appropriate - Multimodal pain control - FEN: Carb control diet - VTE: SCDs, lovenox - Dispo: admit to inpatient, trauma service  Dwan Bolt, MD 03/08/2020, 9:51 PM

## 2020-03-08 NOTE — ED Triage Notes (Signed)
ED to ED transfer. Pt with mechanical fall. C 5,6&7 fx, T2 and 2 fx, left 3,4,5,6 fx (4th displaced) right 1,2&3 fx. Left head of collar bone fx

## 2020-03-08 NOTE — ED Notes (Addendum)
Pt arrived via [place] transport. C-collar and sling and swath in place. Pt states pain 7/10 left rib cage, No respiratory distress noted,able to receive Morphine at 1907. Lying on stretcher in hall, daughter here.  V/S stable. Pt resting without complaint.

## 2020-03-08 NOTE — ED Notes (Signed)
Covid Swab obtained and to the lab 

## 2020-03-08 NOTE — Progress Notes (Signed)
Called re: left distal clavicle fracture.  Sling and NWB to LUE for now.  Full consult to come in am.

## 2020-03-08 NOTE — ED Notes (Signed)
Abrasion to posterior left shoulder. Skin tear x 2 to the distal left forearm. Skin tear to the right upper arm. All areas-placed xeroform dressing, gauze placed and wrapped in kerlix. Pt c collar repositioned. Sling repositioned.

## 2020-03-09 ENCOUNTER — Encounter (HOSPITAL_COMMUNITY): Payer: Self-pay

## 2020-03-09 ENCOUNTER — Telehealth: Payer: Self-pay | Admitting: Neurology

## 2020-03-09 LAB — GLUCOSE, CAPILLARY
Glucose-Capillary: 101 mg/dL — ABNORMAL HIGH (ref 70–99)
Glucose-Capillary: 117 mg/dL — ABNORMAL HIGH (ref 70–99)
Glucose-Capillary: 130 mg/dL — ABNORMAL HIGH (ref 70–99)
Glucose-Capillary: 131 mg/dL — ABNORMAL HIGH (ref 70–99)
Glucose-Capillary: 173 mg/dL — ABNORMAL HIGH (ref 70–99)
Glucose-Capillary: 84 mg/dL (ref 70–99)

## 2020-03-09 LAB — CBC
HCT: 32.6 % — ABNORMAL LOW (ref 36.0–46.0)
Hemoglobin: 10.4 g/dL — ABNORMAL LOW (ref 12.0–15.0)
MCH: 27.3 pg (ref 26.0–34.0)
MCHC: 31.9 g/dL (ref 30.0–36.0)
MCV: 85.6 fL (ref 80.0–100.0)
Platelets: 268 10*3/uL (ref 150–400)
RBC: 3.81 MIL/uL — ABNORMAL LOW (ref 3.87–5.11)
RDW: 16.8 % — ABNORMAL HIGH (ref 11.5–15.5)
WBC: 5.8 10*3/uL (ref 4.0–10.5)
nRBC: 0 % (ref 0.0–0.2)

## 2020-03-09 LAB — BASIC METABOLIC PANEL
Anion gap: 11 (ref 5–15)
BUN: 9 mg/dL (ref 6–20)
CO2: 27 mmol/L (ref 22–32)
Calcium: 8.9 mg/dL (ref 8.9–10.3)
Chloride: 96 mmol/L — ABNORMAL LOW (ref 98–111)
Creatinine, Ser: 0.83 mg/dL (ref 0.44–1.00)
GFR, Estimated: >60 mL/min (ref >60)
Glucose, Bld: 104 mg/dL — ABNORMAL HIGH (ref 70–99)
Potassium: 3.9 mmol/L (ref 3.5–5.1)
Sodium: 134 mmol/L — ABNORMAL LOW (ref 135–145)

## 2020-03-09 LAB — HIV ANTIBODY (ROUTINE TESTING W REFLEX): HIV Screen 4th Generation wRfx: NONREACTIVE

## 2020-03-09 MED ORDER — NICOTINE 14 MG/24HR TD PT24
14.0000 mg | MEDICATED_PATCH | Freq: Every day | TRANSDERMAL | Status: DC
  Administered 2020-03-09 – 2020-03-11 (×3): 14 mg via TRANSDERMAL
  Filled 2020-03-09 (×3): qty 1

## 2020-03-09 MED ORDER — TAPENTADOL HCL 50 MG PO TABS
50.0000 mg | ORAL_TABLET | Freq: Four times a day (QID) | ORAL | Status: DC
  Administered 2020-03-09 – 2020-03-11 (×10): 50 mg via ORAL
  Filled 2020-03-09 (×10): qty 1

## 2020-03-09 MED ORDER — CLOPIDOGREL BISULFATE 75 MG PO TABS
75.0000 mg | ORAL_TABLET | Freq: Every day | ORAL | Status: DC
  Administered 2020-03-09 – 2020-03-11 (×3): 75 mg via ORAL
  Filled 2020-03-09 (×3): qty 1

## 2020-03-09 MED ORDER — LIDOCAINE 5 % EX PTCH
1.0000 | MEDICATED_PATCH | CUTANEOUS | Status: DC
  Administered 2020-03-09 – 2020-03-11 (×3): 1 via TRANSDERMAL
  Filled 2020-03-09 (×3): qty 1

## 2020-03-09 MED ORDER — ACETAMINOPHEN 325 MG PO TABS
650.0000 mg | ORAL_TABLET | Freq: Four times a day (QID) | ORAL | Status: DC
  Administered 2020-03-09 – 2020-03-11 (×8): 650 mg via ORAL
  Filled 2020-03-09 (×8): qty 2

## 2020-03-09 MED ORDER — IPRATROPIUM-ALBUTEROL 0.5-2.5 (3) MG/3ML IN SOLN: 3.0000 mL | Freq: Four times a day (QID) | RESPIRATORY_TRACT | Status: DC | PRN

## 2020-03-09 NOTE — Evaluation (Signed)
Physical Therapy Evaluation Patient Details Name: Terri Li MRN: [id number] DOB: Aug 10, 1960 Today's Date: 03/09/2020   History of Present Illness  Pt is a 59 y.o. female admitted 03/07/20 after fall down stairs sustaining L clavicle fx, bilateral rib fxs, C5-7 transverse process fxs, T1-2 transverse process fxs. PMH includes current tobacco use, COPD, DM, HTN, spinal cord stimulator implant.    Clinical Impression  Pt presents with an overall decrease in functional mobility secondary to above. PTA, pt independent, lives alone, but plans to d/c to sister's home for increased support. Educ on precautions, positioning, incentive spirometer use (pt able to pull 1250-1750 mL) and importance of mobility. Today, pt able to initiate transfer and gait training with various DME for added stability. Pt would benefit from continued acute PT services to maximize functional mobility and independence prior to d/c with HHPT services.     Follow Up Recommendations Home health PT;Supervision for mobility/OOB    Equipment Recommendations  3in1 (PT);Other (comment) (tub/bench)    Recommendations for Other Services       Precautions / Restrictions Precautions Precautions: Fall;Cervical;Other (comment) Precaution Comments: Abdominal for comfort (rib fxs) Required Braces or Orthoses: Cervical Brace;Sling Cervical Brace: Hard collar Restrictions Weight Bearing Restrictions: Yes LUE Weight Bearing: Non weight bearing Other Position/Activity Restrictions: ok for gentle ROM in sling      Mobility  Bed Mobility               General bed mobility comments: Received sitting in recliner    Transfers Overall transfer level: Needs assistance Equipment used: None Transfers: Sit to/from Stand Sit to Stand: Min guard         General transfer comment: Increased time and effort preparing to stand secondary to c/o rib pain, min guard for balance  Ambulation/Gait Ambulation/Gait assistance: Min  guard;Min assist Gait Distance (Feet): 120 Feet Assistive device: 1 person hand held assist;None;Straight cane Gait Pattern/deviations: Step-through pattern;Decreased stride length;Trunk flexed;Antalgic Gait velocity: Decreased   General Gait Details: Slow, antalgic, mildly unsteady gait with initial minA for RUE HHA to maintain balance; close min guard ambulating without UE support, self-corrected instability; trialled gait training with SPC, but pt with difficulty sequencing and reports feeling "wobbly"  Stairs Stairs: Yes Stairs assistance: Min guard Stair Management: One rail Right;Step to pattern;Forwards Number of Stairs: 2 General stair comments: Ascend/descend steps with RUE rail support, min guard for balance  Wheelchair Mobility    Modified Rankin (Stroke Patients Only)       Balance Overall balance assessment: Needs assistance;History of Falls Sitting-balance support: Single extremity supported;Feet supported Sitting balance-Leahy Scale: Fair     Standing balance support: Single extremity supported;During functional activity;No upper extremity supported Standing balance-Leahy Scale: Fair Standing balance comment: Can static stand without UE support; dynamic stability improved with RUE support                             Pertinent Vitals/Pain Pain Assessment: Faces Faces Pain Scale: Hurts little more Pain Location: Ribs > shoulder/back Pain Descriptors / Indicators: Grimacing;Guarding Pain Intervention(s): Limited activity within patient's tolerance;Monitored during session;RN gave pain meds during session    [place] expects to be discharged to:: Private residence Living Arrangements: Other relatives Available Help at Discharge: Family;Available PRN/intermittently Type of Home: House Home Access: Stairs to enter Entrance Stairs-Rails: Right (post) Entrance Stairs-Number of Steps: 4-5 Home Layout: One level Home Equipment: Cane  - single point;Crutches Additional Comments: Plans to d/c to  sister's home (home info for this)    Prior Function Level of Independence: Independent         Comments: Independent with ADLs, IADLs and mobility. Pt was driving Pt reports hx of falls due to recent onset of seizures, denies seizure was cause of this most recent fall     Hand Dominance   Dominant Hand: Right    Extremity/Trunk Assessment   Upper Extremity Assessment Upper Extremity Assessment: LUE deficits/detail;Defer to OT evaluation LUE Coordination: decreased gross motor    Lower Extremity Assessment Lower Extremity Assessment: Overall WFL for tasks assessed    Cervical / Trunk Assessment Cervical / Trunk Assessment: Kyphotic;Other exceptions Cervical / Trunk Exceptions: in hard cervical collar  Communication   Communication: No difficulties  Cognition Arousal/Alertness: Awake/alert Behavior During Therapy: WFL for tasks assessed/performed Overall Cognitive Status: Within Functional Limits for tasks assessed                                 General Comments: WFL for simple tasks, not formally assessed      General Comments General comments (skin integrity, edema, etc.): Cervical collar repositioned for proper fit; reviewed cervical precautions, abdominal precautions, LUE precautions, as well as risk for PNA and importance of IS use    Exercises Other Exercises Other Exercises: Incentive spirometer x5 (good technique) - able to pull 1250-1750 mL   Assessment/Plan    PT Assessment Patient needs continued PT services  PT Problem List Decreased strength;Decreased range of motion;Decreased activity tolerance;Decreased balance;Decreased mobility;Decreased knowledge of use of DME;Decreased knowledge of precautions;Pain;Cardiopulmonary status limiting activity       PT Treatment Interventions DME instruction;Gait training;Stair training;Functional mobility training;Therapeutic  activities;Therapeutic exercise;Balance training;Patient/family education    PT Goals (Current goals can be found in the Care Plan section)  Acute Rehab PT Goals Patient Stated Goal: Stay with sister and see dog Shea Evans) PT Goal Formulation: With patient Time For Goal Achievement: 03/23/20 Potential to Achieve Goals: Good    Frequency Min 5X/week   Barriers to discharge        Co-evaluation               AM-PAC PT "6 Clicks" Mobility  Outcome Measure Help needed turning from your back to your side while in a flat bed without using bedrails?: A Little Help needed moving from lying on your back to sitting on the side of a flat bed without using bedrails?: A Little Help needed moving to and from a bed to a chair (including a wheelchair)?: A Little Help needed standing up from a chair using your arms (e.g., wheelchair or bedside chair)?: A Little Help needed to walk in hospital room?: A Little Help needed climbing 3-5 steps with a railing? : A Little 6 Click Score: 18    End of Session Equipment Utilized During Treatment: Cervical collar;Other (comment) (LUE sling) Activity Tolerance: Patient tolerated treatment well Patient left: in chair;with call bell/phone within reach Nurse Communication: Mobility status PT Visit Diagnosis: Other abnormalities of gait and mobility (R26.89);History of falling (Z91.81)    Time: 2878-6767 PT Time Calculation (min) (ACUTE ONLY): 20 min   Charges:   PT Evaluation $PT Eval Moderate Complexity: [place], PT, DPT Acute Rehabilitation Services  Pager [phone] Office [place] 03/09/2020, 5:50 PM

## 2020-03-09 NOTE — Evaluation (Signed)
Occupational Therapy Evaluation Patient Details Name: Terri Li MRN: [id number] DOB: 08/21/1960 Today's Date: 03/09/2020    History of Present Illness Terri Li is an 59 y.o. female who fell backwards on a deck with her back to the stairs, fell 8 steps to ground. Pt found to have L clavicle fx, L/R rib fx, and C5-6 fx. PMH: COPD, DM, HTN, thyroid disease   Clinical Impression   PT, pt live alone and reports Independence with ADLs, IADLs and mobility without AD. Pt reports recent hx of falls secondary to onset of new seizures. Pt plans to discharge to sister's home. Pt presents now with deficits in strength, coordination, endurance, standing balance, and pain. Pt able to maintain NWB UE precautions well, but does require consistent cues for cervical precautions in hard collar. Pt overall Min A for mobility to/from bathroom with handheld assist. Pt requires Mod A for UB ADLs and LB ADLs due to deficits but anticipate pt to progress well. Recommend HHOT follow-up to maximize independence/safety with ADLs. Plan to trial tub transfer during next session to assess safety and shower DME needs.     Follow Up Recommendations  Home health OT;Supervision/Assistance - 24 hour (would benefit from 24/7 initially )    Equipment Recommendations  3 in 1 bedside commode;Tub/shower bench    Recommendations for Other Services       Precautions / Restrictions Precautions Precautions: Fall;Cervical Precaution Booklet Issued: No (verbally reviewed) Required Braces or Orthoses: Cervical Brace Cervical Brace: Hard collar Restrictions Weight Bearing Restrictions: Yes LUE Weight Bearing: Non weight bearing Other Position/Activity Restrictions: ok for gentle ROM in sling      Mobility Bed Mobility Overal bed mobility: Needs Assistance Bed Mobility: Supine to Sit     Supine to sit: Min assist     General bed mobility comments: Min A with handheld assist to advance trunk. Sat straight up in  bed, difficulty rolling due to pain with rib fx    Transfers Overall transfer level: Needs assistance Equipment used: 1 person hand held assist Transfers: Sit to/from [facility] Sit to Stand: Min assist Stand pivot transfers: Min guard       General transfer comment: Min A for steadying in power up, cueing for spinal precautions and safe hand placement. able to turn at toilet min guard with grab bars    Balance Overall balance assessment: Needs assistance;History of Falls Sitting-balance support: Single extremity supported;Feet supported Sitting balance-Leahy Scale: Fair Sitting balance - Comments: min guard at times with posterior sway, use of R UE on bed for support Postural control: Posterior lean Standing balance support: Single extremity supported;During functional activity Standing balance-Leahy Scale: Fair Standing balance comment: fair static standing, reaching out for UE support during dynamic tasks                            ADL either performed or assessed with clinical judgement   ADL Overall ADL's : Needs assistance/impaired Eating/Feeding: Supervision/ safety;Sitting   Grooming: Min guard;Standing;Wash/dry hands Grooming Details (indicate cue type and reason): cueing for cervical precautions and min guard for safety in static standing  Upper Body Bathing: Moderate assistance;Sitting   Lower Body Bathing: Moderate assistance;Sit to/from stand;Sitting/lateral leans   Upper Body Dressing : Moderate assistance;Sitting Upper Body Dressing Details (indicate cue type and reason): educated to don L UE first with pt verbalizing understanding  Lower Body Dressing: Moderate assistance;Sit to/from stand Lower Body Dressing Details (indicate cue type  and reason): difficulty reaching socks while maintaining cervical precautions. Assistance to don underwear over L side of waist  Toilet Transfer: Minimal assistance;Ambulation;Regular Toilet;Grab  bars Toilet Transfer Details (indicate cue type and reason): Min A with light handheld assist to/from bathroom, cueing for safety and progressing to min guard at times.  Toileting- Clothing Manipulation and Hygiene: Minimal assistance;Sit to/from stand;Sitting/lateral lean Toileting - Clothing Manipulation Details (indicate cue type and reason): Able to perform hygiene, assist with clothing mgmt on L side      Functional mobility during ADLs: Minimal assistance;Cueing for safety;Cueing for sequencing General ADL Comments: Limited by impaired UE use and cervical precautions      Vision Patient Visual Report: No change from baseline Vision Assessment?: No apparent visual deficits     Perception     Praxis      Pertinent Vitals/Pain Pain Assessment: Faces Faces Pain Scale: Hurts a little bit Pain Location: R ribs/shoulder Pain Descriptors / Indicators: Grimacing Pain Intervention(s): Limited activity within patient's tolerance;Monitored during session;Premedicated before session     Hand Dominance Right   Extremity/Trunk Assessment Upper Extremity Assessment Upper Extremity Assessment: LUE deficits/detail LUE Deficits / Details: L UE immobilized. Cleared for gentle ROM in sling. hand, wrist WFL. some discomfort with elbow flexion (maybe 10* shy of full flexion). Too much pain for shoulder ROM LUE: Unable to fully assess due to pain;Unable to fully assess due to immobilization LUE Sensation: WNL LUE Coordination: decreased gross motor   Lower Extremity Assessment Lower Extremity Assessment: Defer to PT evaluation   Cervical / Trunk Assessment Cervical / Trunk Assessment: Kyphotic   Communication Communication Communication: No difficulties   Cognition Arousal/Alertness: Awake/alert Behavior During Therapy: WFL for tasks assessed/performed Overall Cognitive Status: Impaired/Different from baseline Area of Impairment: Safety/judgement;Awareness                          Safety/Judgement: Decreased awareness of safety;Decreased awareness of deficits Awareness: Emergent   General Comments: A&Ox4, pleasant and participatory. Difficulty with awareness and maintaining of cervical precautions often trying to turn head or look down   General Comments  Educated on sling mgmt and use, cervical collar positioning ROM exercises within parameters and DME safety     Exercises     Shoulder Instructions      Home Living Family/patient expects to be discharged to:: Private residence Living Arrangements: Other relatives (sister) Available Help at Discharge: Family;Available PRN/intermittently (sister works) Type of Home: House Home Access: Stairs to enter Technical brewer of Steps: 4-5 Entrance Stairs-Rails: Right Home Layout: One level     Bathroom Shower/Tub: Teacher, early years/pre: Handicapped height (donut riser on toilet)     Home Equipment: None   Additional Comments: Plans to discharge to sister's home      Prior Functioning/Environment Level of Independence: Independent        Comments: Independent with ADLs, IADLs and mobility. Pt was driving Pt reports hx of falls due to recent onset of seizures, denies seizure was cause of this most recent fall        OT Problem List: Decreased strength;Decreased range of motion;Decreased activity tolerance;Impaired balance (sitting and/or standing);Decreased safety awareness;Decreased knowledge of use of DME or AE;Decreased knowledge of precautions;Impaired UE functional use;Pain      OT Treatment/Interventions: Self-care/ADL training;Therapeutic exercise;DME and/or AE instruction;Therapeutic activities;Manual therapy;Patient/family education;Balance training    OT Goals(Current goals can be found in the care plan section) Acute Rehab OT Goals Patient Stated Goal: discharge  to sister's home OT Goal Formulation: With patient Time For Goal Achievement: 03/23/20 Potential to  Achieve Goals: Good ADL Goals Pt Will Perform Grooming: with modified independence;standing Pt Will Perform Lower Body Bathing: with modified independence;sit to/from stand;sitting/lateral leans Pt Will Perform Lower Body Dressing: with modified independence;sitting/lateral leans;sit to/from stand Pt Will Transfer to Toilet: with modified independence;ambulating Pt Will Perform Toileting - Clothing Manipulation and hygiene: with modified independence;sitting/lateral leans;sit to/from stand Pt Will Perform Tub/Shower Transfer: Tub transfer;with supervision;ambulating;shower seat;tub bench Additional ADL Goal #1: Pt to verbalize UE/cervical precautions Independently  OT Frequency: Min 2X/week   Barriers to D/C:            Co-evaluation              AM-PAC OT "6 Clicks" Daily Activity     Outcome Measure Help from another person eating meals?: A Little Help from another person taking care of personal grooming?: A Little Help from another person toileting, which includes using toliet, bedpan, or urinal?: A Little Help from another person bathing (including washing, rinsing, drying)?: A Lot Help from another person to put on and taking off regular upper body clothing?: A Little Help from another person to put on and taking off regular lower body clothing?: A Lot 6 Click Score: 16   End of Session Equipment Utilized During Treatment: Gait belt;Cervical collar;Other (comment) (sling) Nurse Communication: Mobility status  Activity Tolerance: Patient tolerated treatment well Patient left: in bed;with call bell/phone within reach;with bed alarm set;with family/visitor present (sitting EOB )  OT Visit Diagnosis: Unsteadiness on feet (R26.81);Other abnormalities of gait and mobility (R26.89);Muscle weakness (generalized) (M62.81);History of falling (Z91.81);Pain Pain - Right/Left: Left Pain - part of body: Shoulder (ribs)                Time: 1341-1416 OT Time Calculation (min): 35  min Charges:  OT General Charges $OT Visit: 1 Visit OT Evaluation $OT Eval Moderate Complexity: 1 Mod OT Treatments $Self Care/Home Management : 8-22 mins  Layla Maw, OTR/L  Layla Maw 03/09/2020, 2:33 PM

## 2020-03-09 NOTE — Telephone Encounter (Signed)
Terri Li) called. Pt having seizures and migraines can not drive or operate heavy machinery. Patient can not go back to work until she seen the neurologist. Dr. Luan Pulling would like to speak with Dr. Jaynee Eagles about scheduling a earlier appt. Can contact Dr. Luan Pulling directly: [phone].

## 2020-03-09 NOTE — Progress Notes (Signed)
[facility] Progress Note     Subjective: CC:  Chest wall pain. Pulling 1250 in IS. Has not been out of bed yet. States she smokes 1.5 ppd. Her sister is at the bedside.   Objective: Vital signs in last 24 hours: Temp:  [97.8 F (36.6 C)-98.6 F (37 C)] 98.2 F (36.8 C) (11/11 0759) Pulse Rate:  [78-105] 102 (11/11 0759) Resp:  [15-19] 18 (11/11 0759) BP: (93-152)/(60-124) 150/110 (11/11 0759) SpO2:  [91 %-99 %] 97 % (11/11 0759) Weight:  [88.5 kg] 88.5 kg (11/10 2335) Last BM Date: 03/07/20  Intake/Output from previous day: 11/10 0701 - 11/11 0700 In: 500 [P.O.:500] Out: 450 [Urine:450] Intake/Output this shift: No intake/output data recorded.  PE: General appearance: cooperative Neck: collar Chest wall: left sided chest wall tenderness Pulm: CTAB without rhonchi or rales  Cardio: regular rate and rhythm GI: soft, NT  Neurologic: Mental status: Alert, oriented, thought content appropriate Skin: warm and dry, no rashes   Lab Results:  Recent Labs    03/07/20 2041  WBC 10.7*  HGB 12.7  HCT 39.9  PLT 340   BMET Recent Labs    03/07/20 2041  NA 136  K 3.7  CL 99  CO2 25  GLUCOSE 57*  BUN 16  CREATININE 0.90  CALCIUM 9.3   PT/INR No results for input(s): LABPROT, INR in the last 72 hours. CMP     Component Value Date/Time   NA 136 03/07/2020 2041   K 3.7 03/07/2020 2041   CL 99 03/07/2020 2041   CO2 25 03/07/2020 2041   GLUCOSE 57 (L) 03/07/2020 2041   BUN 16 03/07/2020 2041   CREATININE 0.90 03/07/2020 2041   CALCIUM 9.3 03/07/2020 2041   PROT 7.8 03/07/2020 2041   ALBUMIN 4.5 03/07/2020 2041   AST 31 03/07/2020 2041   ALT 25 03/07/2020 2041   ALKPHOS 45 03/07/2020 2041   BILITOT 0.5 03/07/2020 2041   GFRNONAA >60 03/07/2020 2041   GFRAA  10/18/2009 0350    >60        The eGFR has been calculated using the MDRD equation. This calculation has not been validated in all clinical situations. eGFR's persistently <60 mL/min  signify possible Chronic Kidney Disease.   Lipase  No results found for: LIPASE     Studies/Results: DG Chest 2 View  Result Date: 03/07/2020 CLINICAL DATA:  Pain status post fall EXAM: CHEST - 2 VIEW COMPARISON:  March 01, 2009 FINDINGS: The heart size and mediastinal contours are within normal limits. Both lungs are clear. The visualized skeletal structures are unremarkable. Aortic calcifications are noted. IMPRESSION: No active cardiopulmonary disease. Electronically Signed   By: Constance Holster M.D.   On: 03/07/2020 23:05   CT Head Wo Contrast  Result Date: 03/07/2020 CLINICAL DATA:  Fall down 5 steps, head injury laceration, left chest and shoulder pain, no loss of consciousness EXAM: CT HEAD WITHOUT CONTRAST CT CERVICAL SPINE WITHOUT CONTRAST CT CHEST, ABDOMEN AND PELVIS WITH CONTRAST TECHNIQUE: Contiguous axial images were obtained from the base of the skull through the vertex without intravenous contrast. Multidetector CT imaging of the cervical spine was performed without intravenous contrast. Multiplanar CT image reconstructions were also generated. Multidetector CT imaging of the chest, abdomen and pelvis was performed following the standard protocol during bolus administration of intravenous contrast. CONTRAST:  127m OMNIPAQUE IOHEXOL 300 MG/ML  SOLN COMPARISON:  CT head 02/04/2020, CTA head neck 11/25/2019 FINDINGS: CT HEAD FINDINGS Brain: No evidence of acute infarction, hemorrhage,  hydrocephalus, extra-axial collection, visible mass lesion or mass effect. Vascular: Atherosclerotic calcification of the carotid siphons. No hyperdense vessel. Skull: Several sites of scalp contusion/swelling including the left frontal/supraorbital tissues and left parieto-occipital region. Large scalp hematoma. No subjacent calvarial fractures. No other acute osseous injury is seen. Sinuses/Orbits: Minimal thickening in the ethmoid air cells. No layering air-fluid levels or pneumatized secretions.  Chronic bilateral mastoid effusions, right slightly greater than left with some mild hyperostotic features compatible with this chronicity. Appearance similar to comparison imaging. No visible or suspected temporal bone fractures. Middle ear cavities are clear. Other: Few carious lesions within the dentition. CT CERVICAL FINDINGS Alignment: Cervical stabilization collar is not visualized at the time of exam. There is straightening and slight reversal of the normal cervical lordosis which may be degenerative or on a positional basis given the positioning on scout view. Reversal is centered at the C5-6 level. No evidence of traumatic listhesis. No abnormally widened, perched or jumped facets. Normal alignment of the craniocervical and atlantoaxial articulations. Skull base and vertebrae: The osseous structures appear diffusely demineralized which may limit detection of small or nondisplaced fractures. No visible skull base fractures. Dens is intact. No vertebral body height loss. Fractures of the left C5, C6 and C7 transverse processes as well as fractures of the left T1 and possibly T2 transverse processes. Additional fracture of the right posterior first rib. Soft tissues and spinal canal: Soft tissue thickening and swelling adjacent the left transverse process fractures with additional thickening along the paraspinal musculature including the left scalene levator muscle bellies. Additional contusive changes noted of the trapezius and rotator cuff musculature, better detailed on CT of the chest. Disc levels: Diffuse intervertebral disc height loss with multilevel spondylitic endplate changes throughout the cervical spine. Larger disc osteophyte complexes are present C3-4, C5-6, C6-7 and C7-T1 resulting in some mild to moderate canal stenoses. Additionally, questionable development of some ossification along the posterior longitudinal ligament at the C7-T1 and T1-T2 levels as well. Multilevel uncinate spurring and  facet hypertrophic changes are present throughout the spine with moderate to severe narrowing bilaterally C3-4 and more mild to moderate changes more diffusely throughout the cervical levels. Other:  Cervical carotid atherosclerosis is present. CT CHEST FINDINGS Cardiovascular: The aortic root is suboptimally assessed given cardiac pulsation artifact. Atherosclerotic plaque within the normal caliber aorta. No acute luminal abnormality of the imaged aorta. No periaortic stranding or hemorrhage. 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 The right subclavian artery is also heavily calcified with some high-grade segmental stenosis proximally. Additional postsurgical changes noted near the level of the carotids albeit with some extensive native atheromatous plaque. No acute traumatic abnormality of the proximal great vessels are identified. Central pulmonary arteries are normal caliber. No large central filling defects on this non tailored examination of the pulmonary arteries. Normal cardiac size. Trace pericardial fluid is likely within physiologic normal. Three-vessel coronary artery atherosclerosis is noted. No acute or worrisome major venous abnormalities are seen. Mediastinum/Nodes: No mediastinal fluid or gas. Diminutive appearance of the thyroid gland. Otherwise normal thoracic inlet. No acute abnormality of the trachea or esophagus. No worrisome mediastinal, hilar or axillary adenopathy. Lungs/Pleura: Some minimal thickening is seen in the superior most portion of the superior segment of the left lower lobe adjacent the posterior left fourth rib fracture which could reflect some mild contusive change or atelectasis. No  other acute traumatic abnormality of the lung parenchyma is evident. No pneumothorax. No effusion. No consolidative opacity. No  convincing features of edema. Stable confluent opacity in the bilateral lung apices compatible with regions of chronic pleuroparenchymal scarring and some associated architectural distortion. Stable sub solid opacity in the superior segment left lower lobe measuring 5 mm in size (5/61). 5 mm subpleural solid nodule seen in the periphery of the right lower lobe (5/107). Musculoskeletal: Fracture of the distal head left clavicle with intra-articular extension, surrounding thickening and mild widening of the [facility] joint which could reflect. Cervical fractures better detailed above. Redemonstration of the transverse process fractures T1 and possibly T2. Suspect a nondisplaced fracture of the posterior left third, fifth and sixth ribs, minimally displaced fracture of the left fourth rib posteriorly. Additional fractures of the posterior right first rib and third rib near the transverse process. No other acute traumatic osseous injury of the chest wall or imaged thoracic spine. Minimal soft tissue thickening and intercostal thickening adjacent the rib fractures. Background of diffuse multilevel discogenic and facet degenerative changes throughout the thoracic spine including some mild scoliotic curvature. Additional arthrosis bilateral shoulders. Nerve stimulator battery pack seen in the soft tissues of the right flank, leads entering the canal at the T10-11 level, and terminating posterior to the T8-9 vertebrae. CT ABDOMEN PELVIS FINDINGS Hepatobiliary: No direct hepatic injury or perihepatic hematoma. No focal liver abnormality is seen. Patient is post cholecystectomy. Slight prominence of the biliary tree likely related to reservoir effect. No calcified intraductal gallstones. Pancreas: No pancreatic contusive change or ductal disruption. No pancreatic ductal dilatation or surrounding inflammatory changes. Spleen: No direct splenic injury or perisplenic hemorrhage. Normal in size. No concerning splenic lesions.  Adrenals/Urinary Tract: No convincing adrenal hemorrhage or suspicious adrenal lesion. Mild lobular thickening of the adrenal glands may be senescent hyperplasia. No direct renal injury or perinephric hemorrhage. Kidneys are normally located with symmetric enhancementand excretion without extravasation of contrast on the excretory delayed phase imaging. No suspicious renal lesion, urolithiasis or hydronephrosis. No evidence of direct bladder injury, rupture, or other acute bladder abnormality. Stomach/Bowel: Distal esophagus, stomach and duodenum are unremarkable. No focal small bowel thickening or dilatation. Vascular/Lymphatic: No evidence of direct vascular injury in the abdomen or pelvis. Extensive atherosclerotic calcification throughout the aorta and branch vessels. No aneurysm or ectasia. No suspicious or enlarged lymph nodes in the included lymphatic chains. Reproductive: Normal appearance of the uterus and adnexal structures. Other: Focal subcutaneous soft tissue thickening in the bilateral lower quadrants, left greater than right may reflect sequela of prior injectable use. More contusive appearing soft tissue thickening and stranding is seen lateral to the left hip. No abdominopelvic free air or fluid. No bowel containing hernias. Musculoskeletal: No acute fracture or vertebral body height loss of the lumbar spine. Congenital nonfusion right L1 transverse process, benign variant. Mild levocurvature of the lumbar levels, apex L4. No traumatic listhesis. Mild retrolisthesis L4 on 5 is likely degenerative with advanced discogenic and facet degenerative changes at these levels. Additional diffuse degenerative changes throughout the lumbar spine maximal L5-S1. Bony pelvis is intact and congruent. Proximal femora are intact and normally located in the acetabula. Suspect some enthesopathic mineralization near the left lesser trochanter. Few benign bone islands throughout the pelvis. Musculature is normal and  symmetric. IMPRESSION: CT head: 1. Several sites of scalp contusion/swelling including the left frontal/supraorbital tissues and left parieto-occipital region. No subjacent calvarial fractures. 2. No acute intracranial abnormality. 3. Chronic bilateral mastoid effusions, right slightly greater than  left, with some mild hyperostotic features compatible with this chronicity. No visible or suspected temporal bone fractures. CT cervical spine: 1. Fractures of the left C5, C6 and C7 transverse processes coursing in close proximity but clearly into the transverse foramina. 2. Stranding and thickening adjacent the transverse process fractures as well as some thickening and stranding about the left paraspinal musculature. 3. No clear vertebral body fracture or height loss is seen. No traumatic listhesis. 4. 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 5. CT chest, abdomen pelvis. 1. Fractures of the left T1 and possibly T2 transverse processes. No other acute osseous injury of the thoracic or lumbar spine. 2. Suspect a nondisplaced fracture of the posterior left third, fifth and sixth ribs. Minimally displaced fracture of the left fourth rib posteriorly. Fractures of the posterior right first rib and third rib near the transverse process. No pneumothorax. Some minimal ground-glass adjacent the fourth rib fracture in the superior segment left lower lobe may reflect trace pulmonary contusion or atelectasis. 3. Fracture of the distal head left clavicle with intra-articular extension, surrounding thickening and mild widening of the AC joint. Small amount of stranding and trace hemorrhage adjacent the clavicular fracture but without clear vascular injury identified within the limitations of this exam. 4. Contusive changes of the left hip. No large hematoma or site of active contrast extravasation. 5. 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 Additional high-grade segmental. 6. Focal skin thickening and subcutaneous soft tissue along the anterior abdominal wall, likely related to injectable use/insulin administration. 7. Prior cholecystectomy. 8. Aortic Atherosclerosis (ICD10-I70.0). These results were called by telephone at the time of interpretation on 03/07/2020 at 11:42 pm to provider [facility] , who verbally acknowledged these results. Electronically Signed   By: Lovena Le M.D.   On: 03/07/2020 23:43   CT Chest W Contrast  Result Date: 03/07/2020 CLINICAL DATA:  Fall down 5 steps, head injury laceration, left chest and shoulder pain, no loss of consciousness EXAM: CT HEAD WITHOUT CONTRAST CT CERVICAL SPINE WITHOUT CONTRAST CT CHEST, ABDOMEN AND PELVIS WITH CONTRAST TECHNIQUE: Contiguous axial images were obtained from the base of the skull through the vertex without intravenous contrast. Multidetector CT imaging of the cervical spine was performed without intravenous contrast. Multiplanar CT image reconstructions were also generated. Multidetector CT imaging of the chest, abdomen and pelvis was performed following the standard protocol during bolus administration of intravenous contrast. CONTRAST:  173m OMNIPAQUE IOHEXOL 300 MG/ML  SOLN COMPARISON:  CT head 02/04/2020, CTA head neck 11/25/2019 FINDINGS: CT HEAD FINDINGS Brain: No evidence of acute infarction, hemorrhage, hydrocephalus, extra-axial collection, visible mass lesion or mass effect. Vascular: Atherosclerotic calcification of the carotid siphons. No hyperdense vessel. Skull: Several sites of scalp contusion/swelling including the left frontal/supraorbital tissues and left parieto-occipital region. Large scalp hematoma. No subjacent calvarial fractures. No  other acute osseous injury is seen. Sinuses/Orbits: Minimal thickening in the ethmoid air cells. No layering air-fluid levels or pneumatized secretions. Chronic bilateral mastoid effusions, right slightly greater than left with some mild hyperostotic features compatible with this chronicity. Appearance similar to comparison imaging. No visible or suspected temporal bone fractures. Middle ear cavities are clear. Other: Few carious lesions within the dentition. CT CERVICAL FINDINGS Alignment: Cervical stabilization collar is not visualized at  the time of exam. There is straightening and slight reversal of the normal cervical lordosis which may be degenerative or on a positional basis given the positioning on scout view. Reversal is centered at the C5-6 level. No evidence of traumatic listhesis. No abnormally widened, perched or jumped facets. Normal alignment of the craniocervical and atlantoaxial articulations. Skull base and vertebrae: The osseous structures appear diffusely demineralized which may limit detection of small or nondisplaced fractures. No visible skull base fractures. Dens is intact. No vertebral body height loss. Fractures of the left C5, C6 and C7 transverse processes as well as fractures of the left T1 and possibly T2 transverse processes. Additional fracture of the right posterior first rib. Soft tissues and spinal canal: Soft tissue thickening and swelling adjacent the left transverse process fractures with additional thickening along the paraspinal musculature including the left scalene levator muscle bellies. Additional contusive changes noted of the trapezius and rotator cuff musculature, better detailed on CT of the chest. Disc levels: Diffuse intervertebral disc height loss with multilevel spondylitic endplate changes throughout the cervical spine. Larger disc osteophyte complexes are present C3-4, C5-6, C6-7 and C7-T1 resulting in some mild to moderate canal stenoses. Additionally,  questionable development of some ossification along the posterior longitudinal ligament at the C7-T1 and T1-T2 levels as well. Multilevel uncinate spurring and facet hypertrophic changes are present throughout the spine with moderate to severe narrowing bilaterally C3-4 and more mild to moderate changes more diffusely throughout the cervical levels. Other:  Cervical carotid atherosclerosis is present. CT CHEST FINDINGS Cardiovascular: The aortic root is suboptimally assessed given cardiac pulsation artifact. Atherosclerotic plaque within the normal caliber aorta. No acute luminal abnormality of the imaged aorta. No periaortic stranding or hemorrhage. 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 The right subclavian artery is also heavily calcified with some high-grade segmental stenosis proximally. Additional postsurgical changes noted near the level of the carotids albeit with some extensive native atheromatous plaque. No acute traumatic abnormality of the proximal great vessels are identified. Central pulmonary arteries are normal caliber. No large central filling defects on this non tailored examination of the pulmonary arteries. Normal cardiac size. Trace pericardial fluid is likely within physiologic normal. Three-vessel coronary artery atherosclerosis is noted. No acute or worrisome major venous abnormalities are seen. Mediastinum/Nodes: No mediastinal fluid or gas. Diminutive appearance of the thyroid gland. Otherwise normal thoracic inlet. No acute abnormality of the trachea or esophagus. No worrisome mediastinal, hilar or axillary adenopathy. Lungs/Pleura: Some minimal thickening is seen in the superior most portion of the superior segment of the left lower lobe adjacent the posterior left fourth rib fracture which could reflect some mild  contusive change or atelectasis. No other acute traumatic abnormality of the lung parenchyma is evident. No pneumothorax. No effusion. No consolidative opacity. No convincing features of edema. Stable confluent opacity in the bilateral lung apices compatible with regions of chronic pleuroparenchymal scarring and some associated architectural distortion. Stable sub solid opacity in the superior segment left lower lobe measuring 5 mm in size (5/61). 5 mm subpleural solid nodule seen in the periphery of the right lower lobe (5/107). Musculoskeletal: Fracture of the distal head left clavicle with intra-articular extension, surrounding thickening and mild widening of the [facility] joint which could reflect. Cervical fractures better detailed above. Redemonstration of the transverse process fractures T1 and possibly T2. Suspect a nondisplaced fracture  of the posterior left third, fifth and sixth ribs, minimally displaced fracture of the left fourth rib posteriorly. Additional fractures of the posterior right first rib and third rib near the transverse process. No other acute traumatic osseous injury of the chest wall or imaged thoracic spine. Minimal soft tissue thickening and intercostal thickening adjacent the rib fractures. Background of diffuse multilevel discogenic and facet degenerative changes throughout the thoracic spine including some mild scoliotic curvature. Additional arthrosis bilateral shoulders. Nerve stimulator battery pack seen in the soft tissues of the right flank, leads entering the canal at the T10-11 level, and terminating posterior to the T8-9 vertebrae. CT ABDOMEN PELVIS FINDINGS Hepatobiliary: No direct hepatic injury or perihepatic hematoma. No focal liver abnormality is seen. Patient is post cholecystectomy. Slight prominence of the biliary tree likely related to reservoir effect. No calcified intraductal gallstones. Pancreas: No pancreatic contusive change or ductal disruption. No pancreatic ductal  dilatation or surrounding inflammatory changes. Spleen: No direct splenic injury or perisplenic hemorrhage. Normal in size. No concerning splenic lesions. Adrenals/Urinary Tract: No convincing adrenal hemorrhage or suspicious adrenal lesion. Mild lobular thickening of the adrenal glands may be senescent hyperplasia. No direct renal injury or perinephric hemorrhage. Kidneys are normally located with symmetric enhancementand excretion without extravasation of contrast on the excretory delayed phase imaging. No suspicious renal lesion, urolithiasis or hydronephrosis. No evidence of direct bladder injury, rupture, or other acute bladder abnormality. Stomach/Bowel: Distal esophagus, stomach and duodenum are unremarkable. No focal small bowel thickening or dilatation. Vascular/Lymphatic: No evidence of direct vascular injury in the abdomen or pelvis. Extensive atherosclerotic calcification throughout the aorta and branch vessels. No aneurysm or ectasia. No suspicious or enlarged lymph nodes in the included lymphatic chains. Reproductive: Normal appearance of the uterus and adnexal structures. Other: Focal subcutaneous soft tissue thickening in the bilateral lower quadrants, left greater than right may reflect sequela of prior injectable use. More contusive appearing soft tissue thickening and stranding is seen lateral to the left hip. No abdominopelvic free air or fluid. No bowel containing hernias. Musculoskeletal: No acute fracture or vertebral body height loss of the lumbar spine. Congenital nonfusion right L1 transverse process, benign variant. Mild levocurvature of the lumbar levels, apex L4. No traumatic listhesis. Mild retrolisthesis L4 on 5 is likely degenerative with advanced discogenic and facet degenerative changes at these levels. Additional diffuse degenerative changes throughout the lumbar spine maximal L5-S1. Bony pelvis is intact and congruent. Proximal femora are intact and normally located in the  acetabula. Suspect some enthesopathic mineralization near the left lesser trochanter. Few benign bone islands throughout the pelvis. Musculature is normal and symmetric. IMPRESSION: CT head: 1. Several sites of scalp contusion/swelling including the left frontal/supraorbital tissues and left parieto-occipital region. No subjacent calvarial fractures. 2. No acute intracranial abnormality. 3. Chronic bilateral mastoid effusions, right slightly greater than left, with some mild hyperostotic features compatible with this chronicity. No visible or suspected temporal bone fractures. CT cervical spine: 1. Fractures of the left C5, C6 and C7 transverse processes coursing in close proximity but clearly into the transverse foramina. 2. Stranding and thickening adjacent the transverse process fractures as well as some thickening and stranding about the left paraspinal musculature. 3. No clear vertebral body fracture or height loss is seen. No traumatic listhesis. 4. 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 5. CT chest, abdomen pelvis. 1. Fractures of the left T1 and possibly T2 transverse processes. No other acute osseous injury of  the thoracic or lumbar spine. 2. Suspect a nondisplaced fracture of the posterior left third, fifth and sixth ribs. Minimally displaced fracture of the left fourth rib posteriorly. Fractures of the posterior right first rib and third rib near the transverse process. No pneumothorax. Some minimal ground-glass adjacent the fourth rib fracture in the superior segment left lower lobe may reflect trace pulmonary contusion or atelectasis. 3. Fracture of the distal head left clavicle with intra-articular extension, surrounding thickening and mild widening of the AC joint. Small amount of stranding and trace hemorrhage adjacent the clavicular fracture but without clear vascular injury identified within the  limitations of this exam. 4. Contusive changes of the left hip. No large hematoma or site of active contrast extravasation. 5. 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 Additional high-grade segmental. 6. Focal skin thickening and subcutaneous soft tissue along the anterior abdominal wall, likely related to injectable use/insulin administration. 7. Prior cholecystectomy. 8. Aortic Atherosclerosis (ICD10-I70.0). These results were called by telephone at the time of interpretation on 03/07/2020 at 11:42 pm to provider [facility] , who verbally acknowledged these results. Electronically Signed   By: Lovena Le M.D.   On: 03/07/2020 23:43   CT Cervical Spine Wo Contrast  Result Date: 03/07/2020 CLINICAL DATA:  Fall down 5 steps, head injury laceration, left chest and shoulder pain, no loss of consciousness EXAM: CT HEAD WITHOUT CONTRAST CT CERVICAL SPINE WITHOUT CONTRAST CT CHEST, ABDOMEN AND PELVIS WITH CONTRAST TECHNIQUE: Contiguous axial images were obtained from the base of the skull through the vertex without intravenous contrast. Multidetector CT imaging of the cervical spine was performed without intravenous contrast. Multiplanar CT image reconstructions were also generated. Multidetector CT imaging of the chest, abdomen and pelvis was performed following the standard protocol during bolus administration of intravenous contrast. CONTRAST:  153m OMNIPAQUE IOHEXOL 300 MG/ML  SOLN COMPARISON:  CT head 02/04/2020, CTA head neck 11/25/2019 FINDINGS: CT HEAD FINDINGS Brain: No evidence of acute infarction, hemorrhage, hydrocephalus, extra-axial collection, visible mass lesion or mass effect. Vascular: Atherosclerotic calcification of the carotid siphons. No hyperdense vessel. Skull: Several sites of  scalp contusion/swelling including the left frontal/supraorbital tissues and left parieto-occipital region. Large scalp hematoma. No subjacent calvarial fractures. No other acute osseous injury is seen. Sinuses/Orbits: Minimal thickening in the ethmoid air cells. No layering air-fluid levels or pneumatized secretions. Chronic bilateral mastoid effusions, right slightly greater than left with some mild hyperostotic features compatible with this chronicity. Appearance similar to comparison imaging. No visible or suspected temporal bone fractures. Middle ear cavities are clear. Other: Few carious lesions within the dentition. CT CERVICAL FINDINGS Alignment: Cervical stabilization collar is not visualized at the time of exam. There is straightening and slight reversal of the normal cervical lordosis which may be degenerative or on a positional basis given the positioning on scout view. Reversal is centered at the C5-6 level. No evidence of traumatic listhesis. No abnormally widened, perched or jumped facets. Normal alignment of the craniocervical and atlantoaxial articulations. Skull base and vertebrae: The osseous structures appear diffusely demineralized which may limit detection of small or nondisplaced fractures. No visible skull base fractures. Dens is intact. No vertebral body height loss. Fractures of the left C5, C6 and C7 transverse processes as well as fractures of the left T1 and possibly T2 transverse processes. Additional fracture of the  right posterior first rib. Soft tissues and spinal canal: Soft tissue thickening and swelling adjacent the left transverse process fractures with additional thickening along the paraspinal musculature including the left scalene levator muscle bellies. Additional contusive changes noted of the trapezius and rotator cuff musculature, better detailed on CT of the chest. Disc levels: Diffuse intervertebral disc height loss with multilevel spondylitic endplate changes throughout  the cervical spine. Larger disc osteophyte complexes are present C3-4, C5-6, C6-7 and C7-T1 resulting in some mild to moderate canal stenoses. Additionally, questionable development of some ossification along the posterior longitudinal ligament at the C7-T1 and T1-T2 levels as well. Multilevel uncinate spurring and facet hypertrophic changes are present throughout the spine with moderate to severe narrowing bilaterally C3-4 and more mild to moderate changes more diffusely throughout the cervical levels. Other:  Cervical carotid atherosclerosis is present. CT CHEST FINDINGS Cardiovascular: The aortic root is suboptimally assessed given cardiac pulsation artifact. Atherosclerotic plaque within the normal caliber aorta. No acute luminal abnormality of the imaged aorta. No periaortic stranding or hemorrhage. 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 The right subclavian artery is also heavily calcified with some high-grade segmental stenosis proximally. Additional postsurgical changes noted near the level of the carotids albeit with some extensive native atheromatous plaque. No acute traumatic abnormality of the proximal great vessels are identified. Central pulmonary arteries are normal caliber. No large central filling defects on this non tailored examination of the pulmonary arteries. Normal cardiac size. Trace pericardial fluid is likely within physiologic normal. Three-vessel coronary artery atherosclerosis is noted. No acute or worrisome major venous abnormalities are seen. Mediastinum/Nodes: No mediastinal fluid or gas. Diminutive appearance of the thyroid gland. Otherwise normal thoracic inlet. No acute abnormality of the trachea or esophagus. No worrisome mediastinal, hilar or axillary adenopathy. Lungs/Pleura: Some minimal thickening is seen in  the superior most portion of the superior segment of the left lower lobe adjacent the posterior left fourth rib fracture which could reflect some mild contusive change or atelectasis. No other acute traumatic abnormality of the lung parenchyma is evident. No pneumothorax. No effusion. No consolidative opacity. No convincing features of edema. Stable confluent opacity in the bilateral lung apices compatible with regions of chronic pleuroparenchymal scarring and some associated architectural distortion. Stable sub solid opacity in the superior segment left lower lobe measuring 5 mm in size (5/61). 5 mm subpleural solid nodule seen in the periphery of the right lower lobe (5/107). Musculoskeletal: Fracture of the distal head left clavicle with intra-articular extension, surrounding thickening and mild widening of the [facility] joint which could reflect. Cervical fractures better detailed above. Redemonstration of the transverse process fractures T1 and possibly T2. Suspect a nondisplaced fracture of the posterior left third, fifth and sixth ribs, minimally displaced fracture of the left fourth rib posteriorly. Additional fractures of the posterior right first rib and third rib near the transverse process. No other acute traumatic osseous injury of the chest wall or imaged thoracic spine. Minimal soft tissue thickening and intercostal thickening adjacent the rib fractures. Background of diffuse multilevel discogenic and facet degenerative changes throughout the thoracic spine including some mild scoliotic curvature. Additional arthrosis bilateral shoulders. Nerve stimulator battery pack seen in the soft tissues of the right flank, leads entering the canal at the T10-11 level, and terminating posterior to the T8-9 vertebrae. CT ABDOMEN PELVIS FINDINGS Hepatobiliary: No direct hepatic  injury or perihepatic hematoma. No focal liver abnormality is seen. Patient is post cholecystectomy. Slight prominence of the biliary tree likely  related to reservoir effect. No calcified intraductal gallstones. Pancreas: No pancreatic contusive change or ductal disruption. No pancreatic ductal dilatation or surrounding inflammatory changes. Spleen: No direct splenic injury or perisplenic hemorrhage. Normal in size. No concerning splenic lesions. Adrenals/Urinary Tract: No convincing adrenal hemorrhage or suspicious adrenal lesion. Mild lobular thickening of the adrenal glands may be senescent hyperplasia. No direct renal injury or perinephric hemorrhage. Kidneys are normally located with symmetric enhancementand excretion without extravasation of contrast on the excretory delayed phase imaging. No suspicious renal lesion, urolithiasis or hydronephrosis. No evidence of direct bladder injury, rupture, or other acute bladder abnormality. Stomach/Bowel: Distal esophagus, stomach and duodenum are unremarkable. No focal small bowel thickening or dilatation. Vascular/Lymphatic: No evidence of direct vascular injury in the abdomen or pelvis. Extensive atherosclerotic calcification throughout the aorta and branch vessels. No aneurysm or ectasia. No suspicious or enlarged lymph nodes in the included lymphatic chains. Reproductive: Normal appearance of the uterus and adnexal structures. Other: Focal subcutaneous soft tissue thickening in the bilateral lower quadrants, left greater than right may reflect sequela of prior injectable use. More contusive appearing soft tissue thickening and stranding is seen lateral to the left hip. No abdominopelvic free air or fluid. No bowel containing hernias. Musculoskeletal: No acute fracture or vertebral body height loss of the lumbar spine. Congenital nonfusion right L1 transverse process, benign variant. Mild levocurvature of the lumbar levels, apex L4. No traumatic listhesis. Mild retrolisthesis L4 on 5 is likely degenerative with advanced discogenic and facet degenerative changes at these levels. Additional diffuse degenerative  changes throughout the lumbar spine maximal L5-S1. Bony pelvis is intact and congruent. Proximal femora are intact and normally located in the acetabula. Suspect some enthesopathic mineralization near the left lesser trochanter. Few benign bone islands throughout the pelvis. Musculature is normal and symmetric. IMPRESSION: CT head: 1. Several sites of scalp contusion/swelling including the left frontal/supraorbital tissues and left parieto-occipital region. No subjacent calvarial fractures. 2. No acute intracranial abnormality. 3. Chronic bilateral mastoid effusions, right slightly greater than left, with some mild hyperostotic features compatible with this chronicity. No visible or suspected temporal bone fractures. CT cervical spine: 1. Fractures of the left C5, C6 and C7 transverse processes coursing in close proximity but clearly into the transverse foramina. 2. Stranding and thickening adjacent the transverse process fractures as well as some thickening and stranding about the left paraspinal musculature. 3. No clear vertebral body fracture or height loss is seen. No traumatic listhesis. 4. 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 5. CT chest, abdomen pelvis. 1. Fractures of the left T1 and possibly T2 transverse processes. No other acute osseous injury of the thoracic or lumbar spine. 2. Suspect a nondisplaced fracture of the posterior left third, fifth and sixth ribs. Minimally displaced fracture of the left fourth rib posteriorly. Fractures of the posterior right first rib and third rib near the transverse process. No pneumothorax. Some minimal ground-glass adjacent the fourth rib fracture in the superior segment left lower lobe may reflect trace pulmonary contusion or atelectasis. 3. Fracture of the distal head left clavicle with intra-articular extension, surrounding thickening and mild widening of the AC  joint. Small amount of stranding and trace hemorrhage adjacent the clavicular fracture but without clear vascular injury identified within the limitations of this exam. 4. Contusive changes of the left  hip. No large hematoma or site of active contrast extravasation. 5. 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 Additional high-grade segmental. 6. Focal skin thickening and subcutaneous soft tissue along the anterior abdominal wall, likely related to injectable use/insulin administration. 7. Prior cholecystectomy. 8. Aortic Atherosclerosis (ICD10-I70.0). These results were called by telephone at the time of interpretation on 03/07/2020 at 11:42 pm to provider [facility] , who verbally acknowledged these results. Electronically Signed   By: Lovena Le M.D.   On: 03/07/2020 23:43   CT Abdomen Pelvis W Contrast  Result Date: 03/07/2020 CLINICAL DATA:  Fall down 5 steps, head injury laceration, left chest and shoulder pain, no loss of consciousness EXAM: CT HEAD WITHOUT CONTRAST CT CERVICAL SPINE WITHOUT CONTRAST CT CHEST, ABDOMEN AND PELVIS WITH CONTRAST TECHNIQUE: Contiguous axial images were obtained from the base of the skull through the vertex without intravenous contrast. Multidetector CT imaging of the cervical spine was performed without intravenous contrast. Multiplanar CT image reconstructions were also generated. Multidetector CT imaging of the chest, abdomen and pelvis was performed following the standard protocol during bolus administration of intravenous contrast. CONTRAST:  136m OMNIPAQUE IOHEXOL 300 MG/ML  SOLN COMPARISON:  CT head 02/04/2020, CTA head neck 11/25/2019 FINDINGS: CT HEAD FINDINGS Brain: No evidence of acute infarction, hemorrhage, hydrocephalus, extra-axial collection, visible  mass lesion or mass effect. Vascular: Atherosclerotic calcification of the carotid siphons. No hyperdense vessel. Skull: Several sites of scalp contusion/swelling including the left frontal/supraorbital tissues and left parieto-occipital region. Large scalp hematoma. No subjacent calvarial fractures. No other acute osseous injury is seen. Sinuses/Orbits: Minimal thickening in the ethmoid air cells. No layering air-fluid levels or pneumatized secretions. Chronic bilateral mastoid effusions, right slightly greater than left with some mild hyperostotic features compatible with this chronicity. Appearance similar to comparison imaging. No visible or suspected temporal bone fractures. Middle ear cavities are clear. Other: Few carious lesions within the dentition. CT CERVICAL FINDINGS Alignment: Cervical stabilization collar is not visualized at the time of exam. There is straightening and slight reversal of the normal cervical lordosis which may be degenerative or on a positional basis given the positioning on scout view. Reversal is centered at the C5-6 level. No evidence of traumatic listhesis. No abnormally widened, perched or jumped facets. Normal alignment of the craniocervical and atlantoaxial articulations. Skull base and vertebrae: The osseous structures appear diffusely demineralized which may limit detection of small or nondisplaced fractures. No visible skull base fractures. Dens is intact. No vertebral body height loss. Fractures of the left C5, C6 and C7 transverse processes as well as fractures of the left T1 and possibly T2 transverse processes. Additional fracture of the right posterior first rib. Soft tissues and spinal canal: Soft tissue thickening and swelling adjacent the left transverse process fractures with additional thickening along the paraspinal musculature including the left scalene levator muscle bellies. Additional contusive changes noted of the trapezius and rotator cuff musculature, better  detailed on CT of the chest. Disc levels: Diffuse intervertebral disc height loss with multilevel spondylitic endplate changes throughout the cervical spine. Larger disc osteophyte complexes are present C3-4, C5-6, C6-7 and C7-T1 resulting in some mild to moderate canal stenoses. Additionally, questionable development of some ossification along the posterior longitudinal ligament at the C7-T1 and T1-T2 levels as well. Multilevel uncinate spurring and facet hypertrophic changes are present  throughout the spine with moderate to severe narrowing bilaterally C3-4 and more mild to moderate changes more diffusely throughout the cervical levels. Other:  Cervical carotid atherosclerosis is present. CT CHEST FINDINGS Cardiovascular: The aortic root is suboptimally assessed given cardiac pulsation artifact. Atherosclerotic plaque within the normal caliber aorta. No acute luminal abnormality of the imaged aorta. No periaortic stranding or hemorrhage. 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 The right subclavian artery is also heavily calcified with some high-grade segmental stenosis proximally. Additional postsurgical changes noted near the level of the carotids albeit with some extensive native atheromatous plaque. No acute traumatic abnormality of the proximal great vessels are identified. Central pulmonary arteries are normal caliber. No large central filling defects on this non tailored examination of the pulmonary arteries. Normal cardiac size. Trace pericardial fluid is likely within physiologic normal. Three-vessel coronary artery atherosclerosis is noted. No acute or worrisome major venous abnormalities are seen. Mediastinum/Nodes: No mediastinal fluid or gas. Diminutive appearance of the thyroid gland. Otherwise normal thoracic inlet. No acute  abnormality of the trachea or esophagus. No worrisome mediastinal, hilar or axillary adenopathy. Lungs/Pleura: Some minimal thickening is seen in the superior most portion of the superior segment of the left lower lobe adjacent the posterior left fourth rib fracture which could reflect some mild contusive change or atelectasis. No other acute traumatic abnormality of the lung parenchyma is evident. No pneumothorax. No effusion. No consolidative opacity. No convincing features of edema. Stable confluent opacity in the bilateral lung apices compatible with regions of chronic pleuroparenchymal scarring and some associated architectural distortion. Stable sub solid opacity in the superior segment left lower lobe measuring 5 mm in size (5/61). 5 mm subpleural solid nodule seen in the periphery of the right lower lobe (5/107). Musculoskeletal: Fracture of the distal head left clavicle with intra-articular extension, surrounding thickening and mild widening of the [facility] joint which could reflect. Cervical fractures better detailed above. Redemonstration of the transverse process fractures T1 and possibly T2. Suspect a nondisplaced fracture of the posterior left third, fifth and sixth ribs, minimally displaced fracture of the left fourth rib posteriorly. Additional fractures of the posterior right first rib and third rib near the transverse process. No other acute traumatic osseous injury of the chest wall or imaged thoracic spine. Minimal soft tissue thickening and intercostal thickening adjacent the rib fractures. Background of diffuse multilevel discogenic and facet degenerative changes throughout the thoracic spine including some mild scoliotic curvature. Additional arthrosis bilateral shoulders. Nerve stimulator battery pack seen in the soft tissues of the right flank, leads entering the canal at the T10-11 level, and terminating posterior to the T8-9 vertebrae. CT ABDOMEN PELVIS FINDINGS Hepatobiliary: No direct hepatic  injury or perihepatic hematoma. No focal liver abnormality is seen. Patient is post cholecystectomy. Slight prominence of the biliary tree likely related to reservoir effect. No calcified intraductal gallstones. Pancreas: No pancreatic contusive change or ductal disruption. No pancreatic ductal dilatation or surrounding inflammatory changes. Spleen: No direct splenic injury or perisplenic hemorrhage. Normal in size. No concerning splenic lesions. Adrenals/Urinary Tract: No convincing adrenal hemorrhage or suspicious adrenal lesion. Mild lobular thickening of the adrenal glands may be senescent hyperplasia. No direct renal injury or perinephric hemorrhage. Kidneys are normally located with symmetric enhancementand excretion without extravasation of contrast on the excretory delayed phase imaging. No suspicious renal lesion, urolithiasis or hydronephrosis. No evidence  of direct bladder injury, rupture, or other acute bladder abnormality. Stomach/Bowel: Distal esophagus, stomach and duodenum are unremarkable. No focal small bowel thickening or dilatation. Vascular/Lymphatic: No evidence of direct vascular injury in the abdomen or pelvis. Extensive atherosclerotic calcification throughout the aorta and branch vessels. No aneurysm or ectasia. No suspicious or enlarged lymph nodes in the included lymphatic chains. Reproductive: Normal appearance of the uterus and adnexal structures. Other: Focal subcutaneous soft tissue thickening in the bilateral lower quadrants, left greater than right may reflect sequela of prior injectable use. More contusive appearing soft tissue thickening and stranding is seen lateral to the left hip. No abdominopelvic free air or fluid. No bowel containing hernias. Musculoskeletal: No acute fracture or vertebral body height loss of the lumbar spine. Congenital nonfusion right L1 transverse process, benign variant. Mild levocurvature of the lumbar levels, apex L4. No traumatic listhesis. Mild  retrolisthesis L4 on 5 is likely degenerative with advanced discogenic and facet degenerative changes at these levels. Additional diffuse degenerative changes throughout the lumbar spine maximal L5-S1. Bony pelvis is intact and congruent. Proximal femora are intact and normally located in the acetabula. Suspect some enthesopathic mineralization near the left lesser trochanter. Few benign bone islands throughout the pelvis. Musculature is normal and symmetric. IMPRESSION: CT head: 1. Several sites of scalp contusion/swelling including the left frontal/supraorbital tissues and left parieto-occipital region. No subjacent calvarial fractures. 2. No acute intracranial abnormality. 3. Chronic bilateral mastoid effusions, right slightly greater than left, with some mild hyperostotic features compatible with this chronicity. No visible or suspected temporal bone fractures. CT cervical spine: 1. Fractures of the left C5, C6 and C7 transverse processes coursing in close proximity but clearly into the transverse foramina. 2. Stranding and thickening adjacent the transverse process fractures as well as some thickening and stranding about the left paraspinal musculature. 3. No clear vertebral body fracture or height loss is seen. No traumatic listhesis. 4. 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 5. CT chest, abdomen pelvis. 1. Fractures of the left T1 and possibly T2 transverse processes. No other acute osseous injury of the thoracic or lumbar spine. 2. Suspect a nondisplaced fracture of the posterior left third, fifth and sixth ribs. Minimally displaced fracture of the left fourth rib posteriorly. Fractures of the posterior right first rib and third rib near the transverse process. No pneumothorax. Some minimal ground-glass adjacent the fourth rib fracture in the superior segment left lower lobe may reflect trace pulmonary  contusion or atelectasis. 3. Fracture of the distal head left clavicle with intra-articular extension, surrounding thickening and mild widening of the AC joint. Small amount of stranding and trace hemorrhage adjacent the clavicular fracture but without clear vascular injury identified within the limitations of this exam. 4. Contusive changes of the left hip. No large hematoma or site of active contrast extravasation. 5. 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 Additional high-grade segmental. 6. Focal skin thickening and subcutaneous soft tissue along the anterior abdominal wall, likely related to injectable use/insulin administration. 7. Prior cholecystectomy. 8. Aortic Atherosclerosis (ICD10-I70.0). These results were called by telephone at the time of interpretation on 03/07/2020 at 11:42 pm to provider [facility] , who verbally acknowledged these results. Electronically Signed   By: Lovena Le M.D.   On: 03/07/2020  23:43   DG Shoulder Left  Result Date: 03/07/2020 CLINICAL DATA:  Pain status post fall EXAM: LEFT SHOULDER - 2+ VIEW COMPARISON:  None. FINDINGS: There is an acute, nondisplaced fracture of the distal left clavicle. There are degenerative changes of the acromioclavicular joint and left glenohumeral joint. There is calcific tendinopathy of the rotator cuff. There is no glenohumeral dislocation. IMPRESSION: 1. Acute, nondisplaced fracture of the distal left clavicle. 2. Degenerative changes of the acromioclavicular joint and left glenohumeral joint. 3. Calcific tendinopathy of the rotator cuff. Electronically Signed   By: Constance Holster M.D.   On: 03/07/2020 23:02   DG Hip Unilat W or Wo Pelvis 2-3 Views Right  Result Date: 03/07/2020 CLINICAL DATA:  Pain status post fall EXAM:  DG HIP (WITH OR WITHOUT PELVIS) 2-3V RIGHT COMPARISON:  None. FINDINGS: There is no evidence of hip fracture or dislocation. There is no evidence of arthropathy or other focal bone abnormality. IMPRESSION: Negative. Electronically Signed   By: Constance Holster M.D.   On: 03/07/2020 23:06    Anti-infectives: Anti-infectives (From admission, onward)   None       Assessment/Plan 59 yo female presenting after a fall down stairs with multiple injuries: L C5-7 transverse process fractures - collar and F/U with Dr. Vertell Limber L T1 and T2 transverse process fractures L 3,5 and 6 rib fractures - pulm toilet, pain control R 1 and 3 rib fractures - pulm toilet, pain control L clavicle fracture - sling and F/U with Dr. Griffin Basil 2 weeks HLD - home med Hypothyroidism - home synthroid  HTN - home meds COPD   FEN: Carb control diet VTE: SCDs, lovenox Dispo: inpatient trauma service, PT/OT evals pending    LOS: 1 day    Obie Dredge, [facility] Please see Amion for pager number during day hours 7:00am-4:30pm

## 2020-03-09 NOTE — TOC CAGE-AID Note (Signed)
Transition of Care [facility]) - CAGE-AID Screening   Patient Details  Name: Terri Li MRN: [id number] Date of Birth: 05/04/1960  Transition of Care [facility] CM/SW Contact:    Emeterio Reeve, [place] Phone Number: 03/09/2020, 3:38 PM   Clinical Narrative:  CSW met with pt at bedside. CSW introduced self and explained role at the hospital.  Pt denies alcohol use. Pt denies substance use. Pt did not need any resources at this time.    CAGE-AID Screening:    Have You Ever Felt You Ought to Cut Down on Your Drinking or Drug Use?: No Have People Annoyed You By Critizing Your Drinking Or Drug Use?: No Have You Felt Bad Or Guilty About Your Drinking Or Drug Use?: No Have You Ever Had a Drink or Used Drugs First Thing In The Morning to Steady Your Nerves or to Get Rid of a Hangover?: No CAGE-AID Score: 0  Substance Abuse Education Offered: Yes     Blima Ledger, [place] Social Worker [phone]

## 2020-03-09 NOTE — Telephone Encounter (Signed)
Spoke with our referrals dept. They reached out to the office but were not able to get in touch with anyone. They will try again on Monday to reach the office and will get pt scheduled with next available provider for after pt is d/c from hospital.

## 2020-03-09 NOTE — Consult Note (Signed)
Reason for Consult:Left clav fx Referring Physician: Daylan Li is an 59 y.o. female.  HPI: Terri Li was on a deck with her back to the stairs. She stepped backward and fell about 8 steps to the ground. She had significant pain. She was brought to the ED where she was diagnosed with a left clavicle fx in addition to c-spine and rib fxs and orthopedic surgery was consulted. She works for the [employer] and is RHD.  Past Medical History:  Diagnosis Date  . COPD (chronic obstructive pulmonary disease) ([place])   . Diabetes mellitus without complication ([place])   . Hypertension   . Thyroid disease     Past Surgical History:  Procedure Laterality Date  . BACK SURGERY    . CAROTID ENDARTERECTOMY    . SPINAL CORD STIMULATOR IMPLANT      History reviewed. No pertinent family history.  Social History:  reports that she has been smoking. She has been smoking about 1.00 pack per day. She has never used smokeless tobacco. She reports previous alcohol use. She reports that she does not use drugs.  Allergies:  Allergies  Allergen Reactions  . Hydrocodone Itching  . Codeine Itching    Medications: I have reviewed the patient's current medications.  Results for orders placed or performed during the hospital encounter of 03/07/20 (from the past 48 hour(s))  Comprehensive metabolic panel     Status: Abnormal   Collection Time: 03/07/20  8:41 PM  Result Value Ref Range   Sodium 136 135 - 145 mmol/L   Potassium 3.7 3.5 - 5.1 mmol/L   Chloride 99 98 - 111 mmol/L   CO2 25 22 - 32 mmol/L   Glucose, Bld 57 (L) 70 - 99 mg/dL    Comment: Glucose reference range applies only to samples taken after fasting for at least 8 hours.   BUN 16 6 - 20 mg/dL   Creatinine, Ser 0.90 0.44 - 1.00 mg/dL   Calcium 9.3 8.9 - 10.3 mg/dL   Total Protein 7.8 6.5 - 8.1 g/dL   Albumin 4.5 3.5 - 5.0 g/dL   AST 31 15 - 41 U/L   ALT 25 0 - 44 U/L   Alkaline Phosphatase 45 38 - 126 U/L   Total Bilirubin 0.5  0.3 - 1.2 mg/dL   GFR, Estimated >60 >60 mL/min    Comment: (NOTE) Calculated using the CKD-EPI Creatinine Equation (2021)    Anion gap 12 5 - 15    Comment: Performed at [facility], [address] [place], [place] [address]  CBC with Differential     Status: Abnormal   Collection Time: 03/07/20  8:41 PM  Result Value Ref Range   WBC 10.7 (H) 4.0 - 10.5 K/uL   RBC 4.66 3.87 - 5.11 MIL/uL   Hemoglobin 12.7 12.0 - 15.0 g/dL   HCT 39.9 36 - 46 %   MCV 85.6 80.0 - 100.0 fL   MCH 27.3 26.0 - 34.0 pg   MCHC 31.8 30.0 - 36.0 g/dL   RDW 16.8 (H) 11.5 - 15.5 %   Platelets 340 150 - 400 K/uL   nRBC 0.0 0.0 - 0.2 %   Neutrophils Relative % 69 %   Neutro Abs 7.2 1.7 - 7.7 K/uL   Lymphocytes Relative 23 %   Lymphs Abs 2.5 0.7 - 4.0 K/uL   Monocytes Relative 7 %   Monocytes Absolute 0.8 0.1 - 1.0 K/uL   Eosinophils Relative 1 %  Eosinophils Absolute 0.1 0.0 - 0.5 K/uL   Basophils Relative 0 %   Basophils Absolute 0.0 0.0 - 0.1 K/uL   Immature Granulocytes 0 %   Abs Immature Granulocytes 0.04 0.00 - 0.07 K/uL    Comment: Performed at [facility], [address] [place], [place] [address]  CBG monitoring, ED     Status: Abnormal   Collection Time: 03/07/20 11:11 PM  Result Value Ref Range   Glucose-Capillary 69 (L) 70 - 99 mg/dL    Comment: Glucose reference range applies only to samples taken after fasting for at least 8 hours.  CBG monitoring, ED     Status: None   Collection Time: 03/07/20 11:50 PM  Result Value Ref Range   Glucose-Capillary 88 70 - 99 mg/dL    Comment: Glucose reference range applies only to samples taken after fasting for at least 8 hours.  Urinalysis, Routine w reflex microscopic     Status: Abnormal   Collection Time: 03/08/20 12:04 AM  Result Value Ref Range   Color, Urine YELLOW YELLOW   APPearance CLEAR CLEAR   Specific Gravity, Urine <1.005 (L) 1.005 - 1.030   pH 5.5 5.0 - 8.0   Glucose, UA NEGATIVE NEGATIVE mg/dL   Hgb urine  dipstick NEGATIVE NEGATIVE   Bilirubin Urine NEGATIVE NEGATIVE   Ketones, ur NEGATIVE NEGATIVE mg/dL   Protein, ur NEGATIVE NEGATIVE mg/dL   Nitrite NEGATIVE NEGATIVE   Leukocytes,Ua NEGATIVE NEGATIVE    Comment: Microscopic not done on urines with negative protein, blood, leukocytes, nitrite, or glucose < 500 mg/dL. Performed at [facility], [address] [place], [place] [address]   Respiratory Panel by RT PCR (Flu A&B, Covid) - Nasopharyngeal Swab     Status: None   Collection Time: 03/08/20 12:04 AM   Specimen: Nasopharyngeal Swab  Result Value Ref Range   SARS Coronavirus 2 by RT PCR NEGATIVE NEGATIVE    Comment: (NOTE) SARS-CoV-2 target nucleic acids are NOT DETECTED.  The SARS-CoV-2 RNA is generally detectable in upper respiratoy specimens during the acute phase of infection. The lowest concentration of SARS-CoV-2 viral copies this assay can detect is 131 copies/mL. A negative result does not preclude SARS-Cov-2 infection and should not be used as the sole basis for treatment or other patient management decisions. A negative result may occur with  improper specimen collection/handling, submission of specimen other than nasopharyngeal swab, presence of viral mutation(s) within the areas targeted by this assay, and inadequate number of viral copies (<131 copies/mL). A negative result must be combined with clinical observations, patient history, and epidemiological information. The expected result is Negative.  Fact Sheet for Patients:  PinkCheek.be  Fact Sheet for Healthcare Providers:  GravelBags.it  This test is no t yet approved or cleared by the [place] FDA and  has been authorized for detection and/or diagnosis of SARS-CoV-2 by FDA under an Emergency Use Authorization (EUA). This EUA will remain  in effect (meaning this test can be used) for the duration of the COVID-19 declaration under  Section 564(b)(1) of the Act, 21 U.S.C. section 360bbb-3(b)(1), unless the authorization is terminated or revoked sooner.     Influenza A by PCR NEGATIVE NEGATIVE   Influenza B by PCR NEGATIVE NEGATIVE    Comment: (NOTE) The Xpert Xpress SARS-CoV-2/FLU/RSV assay is intended as an aid in  the diagnosis of influenza from Nasopharyngeal swab specimens and  should not be used as a sole basis for treatment. Nasal washings and  aspirates are unacceptable for Xpert Xpress SARS-CoV-2/FLU/RSV  testing.  Fact Sheet for Patients: PinkCheek.be  Fact Sheet for Healthcare Providers: GravelBags.it  This test is not yet approved or cleared by the [place] FDA and  has been authorized for detection and/or diagnosis of SARS-CoV-2 by  FDA under an Emergency Use Authorization (EUA). This EUA will remain  in effect (meaning this test can be used) for the duration of the  Covid-19 declaration under Section 564(b)(1) of the Act, 21  U.S.C. section 360bbb-3(b)(1), unless the authorization is  terminated or revoked. Performed at [facility], [address] [place], [place] [address]   CBG monitoring, ED     Status: Abnormal   Collection Time: 03/08/20 10:07 AM  Result Value Ref Range   Glucose-Capillary 150 (H) 70 - 99 mg/dL    Comment: Glucose reference range applies only to samples taken after fasting for at least 8 hours.  Hemoglobin A1c     Status: Abnormal   Collection Time: 03/08/20 10:37 AM  Result Value Ref Range   Hgb A1c MFr Bld 6.4 (H) 4.8 - 5.6 %    Comment: (NOTE) Pre diabetes:          5.7%-6.4%  Diabetes:              >6.4%  Glycemic control for   <7.0% adults with diabetes    Mean Plasma Glucose 136.98 mg/dL    Comment: Performed at [facility] [address] [place], [place] [address]  CBG monitoring, ED     Status: Abnormal   Collection Time: 03/08/20 12:18 PM  Result Value Ref Range    Glucose-Capillary 112 (H) 70 - 99 mg/dL    Comment: Glucose reference range applies only to samples taken after fasting for at least 8 hours.  CBG monitoring, ED     Status: None   Collection Time: 03/08/20  4:48 PM  Result Value Ref Range   Glucose-Capillary 97 70 - 99 mg/dL    Comment: Glucose reference range applies only to samples taken after fasting for at least 8 hours.  CBG monitoring, ED     Status: Abnormal   Collection Time: 03/08/20  8:30 PM  Result Value Ref Range   Glucose-Capillary 121 (H) 70 - 99 mg/dL    Comment: Glucose reference range applies only to samples taken after fasting for at least 8 hours.  Glucose, capillary     Status: Abnormal   Collection Time: 03/08/20 11:56 PM  Result Value Ref Range   Glucose-Capillary 168 (H) 70 - 99 mg/dL    Comment: Glucose reference range applies only to samples taken after fasting for at least 8 hours.  HIV Antibody (routine testing w rflx)     Status: None   Collection Time: 03/09/20  1:31 AM  Result Value Ref Range   HIV Screen 4th Generation wRfx Non Reactive Non Reactive    Comment: Performed at [facility], [place] [address], [place], [place] [address]  Glucose, capillary     Status: None   Collection Time: 03/09/20  4:08 AM  Result Value Ref Range   Glucose-Capillary 84 70 - 99 mg/dL    Comment: Glucose reference range applies only to samples taken after fasting for at least 8 hours.  Glucose, capillary     Status: Abnormal   Collection Time: 03/09/20  7:52 AM  Result Value Ref Range   Glucose-Capillary 130 (H) 70 - 99 mg/dL  Comment: Glucose reference range applies only to samples taken after fasting for at least 8 hours.    DG Chest 2 View  Result Date: 03/07/2020 CLINICAL DATA:  Pain status post fall EXAM: CHEST - 2 VIEW COMPARISON:  March 01, 2009 FINDINGS: The heart size and mediastinal contours are within normal limits. Both lungs are clear. The visualized skeletal structures are unremarkable. Aortic  calcifications are noted. IMPRESSION: No active cardiopulmonary disease. Electronically Signed   By: Constance Holster M.D.   On: 03/07/2020 23:05   CT Head Wo Contrast  Result Date: 03/07/2020 CLINICAL DATA:  Fall down 5 steps, head injury laceration, left chest and shoulder pain, no loss of consciousness EXAM: CT HEAD WITHOUT CONTRAST CT CERVICAL SPINE WITHOUT CONTRAST CT CHEST, ABDOMEN AND PELVIS WITH CONTRAST TECHNIQUE: Contiguous axial images were obtained from the base of the skull through the vertex without intravenous contrast. Multidetector CT imaging of the cervical spine was performed without intravenous contrast. Multiplanar CT image reconstructions were also generated. Multidetector CT imaging of the chest, abdomen and pelvis was performed following the standard protocol during bolus administration of intravenous contrast. CONTRAST:  11mL OMNIPAQUE IOHEXOL 300 MG/ML  SOLN COMPARISON:  CT head 02/04/2020, CTA head neck 11/25/2019 FINDINGS: CT HEAD FINDINGS Brain: No evidence of acute infarction, hemorrhage, hydrocephalus, extra-axial collection, visible mass lesion or mass effect. Vascular: Atherosclerotic calcification of the carotid siphons. No hyperdense vessel. Skull: Several sites of scalp contusion/swelling including the left frontal/supraorbital tissues and left parieto-occipital region. Large scalp hematoma. No subjacent calvarial fractures. No other acute osseous injury is seen. Sinuses/Orbits: Minimal thickening in the ethmoid air cells. No layering air-fluid levels or pneumatized secretions. Chronic bilateral mastoid effusions, right slightly greater than left with some mild hyperostotic features compatible with this chronicity. Appearance similar to comparison imaging. No visible or suspected temporal bone fractures. Middle ear cavities are clear. Other: Few carious lesions within the dentition. CT CERVICAL FINDINGS Alignment: Cervical stabilization collar is not visualized at the time  of exam. There is straightening and slight reversal of the normal cervical lordosis which may be degenerative or on a positional basis given the positioning on scout view. Reversal is centered at the C5-6 level. No evidence of traumatic listhesis. No abnormally widened, perched or jumped facets. Normal alignment of the craniocervical and atlantoaxial articulations. Skull base and vertebrae: The osseous structures appear diffusely demineralized which may limit detection of small or nondisplaced fractures. No visible skull base fractures. Dens is intact. No vertebral body height loss. Fractures of the left C5, C6 and C7 transverse processes as well as fractures of the left T1 and possibly T2 transverse processes. Additional fracture of the right posterior first rib. Soft tissues and spinal canal: Soft tissue thickening and swelling adjacent the left transverse process fractures with additional thickening along the paraspinal musculature including the left scalene levator muscle bellies. Additional contusive changes noted of the trapezius and rotator cuff musculature, better detailed on CT of the chest. Disc levels: Diffuse intervertebral disc height loss with multilevel spondylitic endplate changes throughout the cervical spine. Larger disc osteophyte complexes are present C3-4, C5-6, C6-7 and C7-T1 resulting in some mild to moderate canal stenoses. Additionally, questionable development of some ossification along the posterior longitudinal ligament at the C7-T1 and T1-T2 levels as well. Multilevel uncinate spurring and facet hypertrophic changes are present throughout the spine with moderate to severe narrowing bilaterally C3-4 and more mild to moderate changes more diffusely throughout the cervical levels. Other:  Cervical carotid atherosclerosis is present. CT  CHEST FINDINGS Cardiovascular: The aortic root is suboptimally assessed given cardiac pulsation artifact. Atherosclerotic plaque within the normal caliber  aorta. No acute luminal abnormality of the imaged aorta. No periaortic stranding or hemorrhage. 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 The right subclavian artery is also heavily calcified with some high-grade segmental stenosis proximally. Additional postsurgical changes noted near the level of the carotids albeit with some extensive native atheromatous plaque. No acute traumatic abnormality of the proximal great vessels are identified. Central pulmonary arteries are normal caliber. No large central filling defects on this non tailored examination of the pulmonary arteries. Normal cardiac size. Trace pericardial fluid is likely within physiologic normal. Three-vessel coronary artery atherosclerosis is noted. No acute or worrisome major venous abnormalities are seen. Mediastinum/Nodes: No mediastinal fluid or gas. Diminutive appearance of the thyroid gland. Otherwise normal thoracic inlet. No acute abnormality of the trachea or esophagus. No worrisome mediastinal, hilar or axillary adenopathy. Lungs/Pleura: Some minimal thickening is seen in the superior most portion of the superior segment of the left lower lobe adjacent the posterior left fourth rib fracture which could reflect some mild contusive change or atelectasis. No other acute traumatic abnormality of the lung parenchyma is evident. No pneumothorax. No effusion. No consolidative opacity. No convincing features of edema. Stable confluent opacity in the bilateral lung apices compatible with regions of chronic pleuroparenchymal scarring and some associated architectural distortion. Stable sub solid opacity in the superior segment left lower lobe measuring 5 mm in size (5/61). 5 mm subpleural solid nodule seen in the periphery of the right lower lobe (5/107). Musculoskeletal: Fracture of the  distal head left clavicle with intra-articular extension, surrounding thickening and mild widening of the [facility] joint which could reflect. Cervical fractures better detailed above. Redemonstration of the transverse process fractures T1 and possibly T2. Suspect a nondisplaced fracture of the posterior left third, fifth and sixth ribs, minimally displaced fracture of the left fourth rib posteriorly. Additional fractures of the posterior right first rib and third rib near the transverse process. No other acute traumatic osseous injury of the chest wall or imaged thoracic spine. Minimal soft tissue thickening and intercostal thickening adjacent the rib fractures. Background of diffuse multilevel discogenic and facet degenerative changes throughout the thoracic spine including some mild scoliotic curvature. Additional arthrosis bilateral shoulders. Nerve stimulator battery pack seen in the soft tissues of the right flank, leads entering the canal at the T10-11 level, and terminating posterior to the T8-9 vertebrae. CT ABDOMEN PELVIS FINDINGS Hepatobiliary: No direct hepatic injury or perihepatic hematoma. No focal liver abnormality is seen. Patient is post cholecystectomy. Slight prominence of the biliary tree likely related to reservoir effect. No calcified intraductal gallstones. Pancreas: No pancreatic contusive change or ductal disruption. No pancreatic ductal dilatation or surrounding inflammatory changes. Spleen: No direct splenic injury or perisplenic hemorrhage. Normal in size. No concerning splenic lesions. Adrenals/Urinary Tract: No convincing adrenal hemorrhage or suspicious adrenal lesion. Mild lobular thickening of the adrenal glands may be senescent hyperplasia. No direct renal injury or perinephric hemorrhage. Kidneys are normally located with symmetric enhancementand excretion without extravasation of contrast on the excretory delayed phase imaging. No suspicious renal lesion, urolithiasis or hydronephrosis.  No evidence of direct bladder injury, rupture, or other acute bladder abnormality. Stomach/Bowel: Distal esophagus, stomach and duodenum are unremarkable. No focal small bowel thickening or dilatation. Vascular/Lymphatic: No evidence of  direct vascular injury in the abdomen or pelvis. Extensive atherosclerotic calcification throughout the aorta and branch vessels. No aneurysm or ectasia. No suspicious or enlarged lymph nodes in the included lymphatic chains. Reproductive: Normal appearance of the uterus and adnexal structures. Other: Focal subcutaneous soft tissue thickening in the bilateral lower quadrants, left greater than right may reflect sequela of prior injectable use. More contusive appearing soft tissue thickening and stranding is seen lateral to the left hip. No abdominopelvic free air or fluid. No bowel containing hernias. Musculoskeletal: No acute fracture or vertebral body height loss of the lumbar spine. Congenital nonfusion right L1 transverse process, benign variant. Mild levocurvature of the lumbar levels, apex L4. No traumatic listhesis. Mild retrolisthesis L4 on 5 is likely degenerative with advanced discogenic and facet degenerative changes at these levels. Additional diffuse degenerative changes throughout the lumbar spine maximal L5-S1. Bony pelvis is intact and congruent. Proximal femora are intact and normally located in the acetabula. Suspect some enthesopathic mineralization near the left lesser trochanter. Few benign bone islands throughout the pelvis. Musculature is normal and symmetric. IMPRESSION: CT head: 1. Several sites of scalp contusion/swelling including the left frontal/supraorbital tissues and left parieto-occipital region. No subjacent calvarial fractures. 2. No acute intracranial abnormality. 3. Chronic bilateral mastoid effusions, right slightly greater than left, with some mild hyperostotic features compatible with this chronicity. No visible or suspected temporal bone  fractures. CT cervical spine: 1. Fractures of the left C5, C6 and C7 transverse processes coursing in close proximity but clearly into the transverse foramina. 2. Stranding and thickening adjacent the transverse process fractures as well as some thickening and stranding about the left paraspinal musculature. 3. No clear vertebral body fracture or height loss is seen. No traumatic listhesis. 4. 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 5. CT chest, abdomen pelvis. 1. Fractures of the left T1 and possibly T2 transverse processes. No other acute osseous injury of the thoracic or lumbar spine. 2. Suspect a nondisplaced fracture of the posterior left third, fifth and sixth ribs. Minimally displaced fracture of the left fourth rib posteriorly. Fractures of the posterior right first rib and third rib near the transverse process. No pneumothorax. Some minimal ground-glass adjacent the fourth rib fracture in the superior segment left lower lobe may reflect trace pulmonary contusion or atelectasis. 3. Fracture of the distal head left clavicle with intra-articular extension, surrounding thickening and mild widening of the AC joint. Small amount of stranding and trace hemorrhage adjacent the clavicular fracture but without clear vascular injury identified within the limitations of this exam. 4. Contusive changes of the left hip. No large hematoma or site of active contrast extravasation. 5. 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 Additional high-grade segmental. 6. Focal skin thickening and subcutaneous soft tissue along the anterior abdominal wall, likely related to injectable use/insulin administration. 7. Prior  cholecystectomy. 8. Aortic Atherosclerosis (ICD10-I70.0). These results were called by telephone at the time of interpretation on 03/07/2020 at 11:42 pm to provider [facility] , who verbally acknowledged these results. Electronically Signed   By: Lovena Le M.D.   On: 03/07/2020 23:43   CT Chest W Contrast  Result Date: 03/07/2020 CLINICAL DATA:  Fall down 5 steps, head injury laceration, left chest and shoulder pain, no loss of  consciousness EXAM: CT HEAD WITHOUT CONTRAST CT CERVICAL SPINE WITHOUT CONTRAST CT CHEST, ABDOMEN AND PELVIS WITH CONTRAST TECHNIQUE: Contiguous axial images were obtained from the base of the skull through the vertex without intravenous contrast. Multidetector CT imaging of the cervical spine was performed without intravenous contrast. Multiplanar CT image reconstructions were also generated. Multidetector CT imaging of the chest, abdomen and pelvis was performed following the standard protocol during bolus administration of intravenous contrast. CONTRAST:  117mL OMNIPAQUE IOHEXOL 300 MG/ML  SOLN COMPARISON:  CT head 02/04/2020, CTA head neck 11/25/2019 FINDINGS: CT HEAD FINDINGS Brain: No evidence of acute infarction, hemorrhage, hydrocephalus, extra-axial collection, visible mass lesion or mass effect. Vascular: Atherosclerotic calcification of the carotid siphons. No hyperdense vessel. Skull: Several sites of scalp contusion/swelling including the left frontal/supraorbital tissues and left parieto-occipital region. Large scalp hematoma. No subjacent calvarial fractures. No other acute osseous injury is seen. Sinuses/Orbits: Minimal thickening in the ethmoid air cells. No layering air-fluid levels or pneumatized secretions. Chronic bilateral mastoid effusions, right slightly greater than left with some mild hyperostotic features compatible with this chronicity. Appearance similar to comparison imaging. No visible or suspected temporal bone fractures. Middle ear cavities are  clear. Other: Few carious lesions within the dentition. CT CERVICAL FINDINGS Alignment: Cervical stabilization collar is not visualized at the time of exam. There is straightening and slight reversal of the normal cervical lordosis which may be degenerative or on a positional basis given the positioning on scout view. Reversal is centered at the C5-6 level. No evidence of traumatic listhesis. No abnormally widened, perched or jumped facets. Normal alignment of the craniocervical and atlantoaxial articulations. Skull base and vertebrae: The osseous structures appear diffusely demineralized which may limit detection of small or nondisplaced fractures. No visible skull base fractures. Dens is intact. No vertebral body height loss. Fractures of the left C5, C6 and C7 transverse processes as well as fractures of the left T1 and possibly T2 transverse processes. Additional fracture of the right posterior first rib. Soft tissues and spinal canal: Soft tissue thickening and swelling adjacent the left transverse process fractures with additional thickening along the paraspinal musculature including the left scalene levator muscle bellies. Additional contusive changes noted of the trapezius and rotator cuff musculature, better detailed on CT of the chest. Disc levels: Diffuse intervertebral disc height loss with multilevel spondylitic endplate changes throughout the cervical spine. Larger disc osteophyte complexes are present C3-4, C5-6, C6-7 and C7-T1 resulting in some mild to moderate canal stenoses. Additionally, questionable development of some ossification along the posterior longitudinal ligament at the C7-T1 and T1-T2 levels as well. Multilevel uncinate spurring and facet hypertrophic changes are present throughout the spine with moderate to severe narrowing bilaterally C3-4 and more mild to moderate changes more diffusely throughout the cervical levels. Other:  Cervical carotid atherosclerosis is present. CT CHEST  FINDINGS Cardiovascular: The aortic root is suboptimally assessed given cardiac pulsation artifact. Atherosclerotic plaque within the normal caliber aorta. No acute luminal abnormality of the imaged aorta. No periaortic stranding or hemorrhage. 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 The right subclavian artery is also heavily calcified with some high-grade segmental stenosis proximally. Additional postsurgical changes noted near the level of the carotids albeit with some extensive native atheromatous plaque. No acute traumatic abnormality of the proximal great vessels are identified. Central pulmonary arteries are normal caliber. No large central  filling defects on this non tailored examination of the pulmonary arteries. Normal cardiac size. Trace pericardial fluid is likely within physiologic normal. Three-vessel coronary artery atherosclerosis is noted. No acute or worrisome major venous abnormalities are seen. Mediastinum/Nodes: No mediastinal fluid or gas. Diminutive appearance of the thyroid gland. Otherwise normal thoracic inlet. No acute abnormality of the trachea or esophagus. No worrisome mediastinal, hilar or axillary adenopathy. Lungs/Pleura: Some minimal thickening is seen in the superior most portion of the superior segment of the left lower lobe adjacent the posterior left fourth rib fracture which could reflect some mild contusive change or atelectasis. No other acute traumatic abnormality of the lung parenchyma is evident. No pneumothorax. No effusion. No consolidative opacity. No convincing features of edema. Stable confluent opacity in the bilateral lung apices compatible with regions of chronic pleuroparenchymal scarring and some associated architectural distortion. Stable sub solid opacity in the superior segment left lower lobe  measuring 5 mm in size (5/61). 5 mm subpleural solid nodule seen in the periphery of the right lower lobe (5/107). Musculoskeletal: Fracture of the distal head left clavicle with intra-articular extension, surrounding thickening and mild widening of the [facility] joint which could reflect. Cervical fractures better detailed above. Redemonstration of the transverse process fractures T1 and possibly T2. Suspect a nondisplaced fracture of the posterior left third, fifth and sixth ribs, minimally displaced fracture of the left fourth rib posteriorly. Additional fractures of the posterior right first rib and third rib near the transverse process. No other acute traumatic osseous injury of the chest wall or imaged thoracic spine. Minimal soft tissue thickening and intercostal thickening adjacent the rib fractures. Background of diffuse multilevel discogenic and facet degenerative changes throughout the thoracic spine including some mild scoliotic curvature. Additional arthrosis bilateral shoulders. Nerve stimulator battery pack seen in the soft tissues of the right flank, leads entering the canal at the T10-11 level, and terminating posterior to the T8-9 vertebrae. CT ABDOMEN PELVIS FINDINGS Hepatobiliary: No direct hepatic injury or perihepatic hematoma. No focal liver abnormality is seen. Patient is post cholecystectomy. Slight prominence of the biliary tree likely related to reservoir effect. No calcified intraductal gallstones. Pancreas: No pancreatic contusive change or ductal disruption. No pancreatic ductal dilatation or surrounding inflammatory changes. Spleen: No direct splenic injury or perisplenic hemorrhage. Normal in size. No concerning splenic lesions. Adrenals/Urinary Tract: No convincing adrenal hemorrhage or suspicious adrenal lesion. Mild lobular thickening of the adrenal glands may be senescent hyperplasia. No direct renal injury or perinephric hemorrhage. Kidneys are normally located with symmetric  enhancementand excretion without extravasation of contrast on the excretory delayed phase imaging. No suspicious renal lesion, urolithiasis or hydronephrosis. No evidence of direct bladder injury, rupture, or other acute bladder abnormality. Stomach/Bowel: Distal esophagus, stomach and duodenum are unremarkable. No focal small bowel thickening or dilatation. Vascular/Lymphatic: No evidence of direct vascular injury in the abdomen or pelvis. Extensive atherosclerotic calcification throughout the aorta and branch vessels. No aneurysm or ectasia. No suspicious or enlarged lymph nodes in the included lymphatic chains. Reproductive: Normal appearance of the uterus and adnexal structures. Other: Focal subcutaneous soft tissue thickening in the bilateral lower quadrants, left greater than right may reflect sequela of prior injectable use. More contusive appearing soft tissue thickening and stranding is seen lateral to the left hip. No abdominopelvic free air or fluid. No bowel containing hernias. Musculoskeletal: No acute fracture or vertebral body height loss of the lumbar spine. Congenital nonfusion right L1 transverse process, benign variant. Mild levocurvature of the lumbar levels, apex L4.  No traumatic listhesis. Mild retrolisthesis L4 on 5 is likely degenerative with advanced discogenic and facet degenerative changes at these levels. Additional diffuse degenerative changes throughout the lumbar spine maximal L5-S1. Bony pelvis is intact and congruent. Proximal femora are intact and normally located in the acetabula. Suspect some enthesopathic mineralization near the left lesser trochanter. Few benign bone islands throughout the pelvis. Musculature is normal and symmetric. IMPRESSION: CT head: 1. Several sites of scalp contusion/swelling including the left frontal/supraorbital tissues and left parieto-occipital region. No subjacent calvarial fractures. 2. No acute intracranial abnormality. 3. Chronic bilateral mastoid  effusions, right slightly greater than left, with some mild hyperostotic features compatible with this chronicity. No visible or suspected temporal bone fractures. CT cervical spine: 1. Fractures of the left C5, C6 and C7 transverse processes coursing in close proximity but clearly into the transverse foramina. 2. Stranding and thickening adjacent the transverse process fractures as well as some thickening and stranding about the left paraspinal musculature. 3. No clear vertebral body fracture or height loss is seen. No traumatic listhesis. 4. 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 5. CT chest, abdomen pelvis. 1. Fractures of the left T1 and possibly T2 transverse processes. No other acute osseous injury of the thoracic or lumbar spine. 2. Suspect a nondisplaced fracture of the posterior left third, fifth and sixth ribs. Minimally displaced fracture of the left fourth rib posteriorly. Fractures of the posterior right first rib and third rib near the transverse process. No pneumothorax. Some minimal ground-glass adjacent the fourth rib fracture in the superior segment left lower lobe may reflect trace pulmonary contusion or atelectasis. 3. Fracture of the distal head left clavicle with intra-articular extension, surrounding thickening and mild widening of the AC joint. Small amount of stranding and trace hemorrhage adjacent the clavicular fracture but without clear vascular injury identified within the limitations of this exam. 4. Contusive changes of the left hip. No large hematoma or site of active contrast extravasation. 5. 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 Additional high-grade segmental. 6. Focal  skin thickening and subcutaneous soft tissue along the anterior abdominal wall, likely related to injectable use/insulin administration. 7. Prior cholecystectomy. 8. Aortic Atherosclerosis (ICD10-I70.0). These results were called by telephone at the time of interpretation on 03/07/2020 at 11:42 pm to provider [facility] , who verbally acknowledged these results. Electronically Signed   By: Lovena Le M.D.   On: 03/07/2020 23:43   CT Cervical Spine Wo Contrast  Result Date: 03/07/2020 CLINICAL DATA:  Fall down 5 steps, head injury laceration, left chest and shoulder pain, no loss of consciousness EXAM: CT HEAD WITHOUT CONTRAST CT CERVICAL SPINE WITHOUT CONTRAST CT CHEST, ABDOMEN AND PELVIS WITH CONTRAST TECHNIQUE: Contiguous axial images were obtained from the base of the skull through the vertex without intravenous contrast. Multidetector CT imaging of the cervical spine was performed without intravenous contrast. Multiplanar CT image reconstructions were also generated. Multidetector CT imaging of the chest, abdomen and pelvis was performed following the standard protocol during bolus administration of intravenous contrast. CONTRAST:  114mL OMNIPAQUE IOHEXOL 300 MG/ML  SOLN COMPARISON:  CT head 02/04/2020, CTA head neck 11/25/2019 FINDINGS: CT HEAD FINDINGS Brain: No evidence of acute infarction, hemorrhage, hydrocephalus, extra-axial collection, visible mass lesion or mass effect. Vascular: Atherosclerotic  calcification of the carotid siphons. No hyperdense vessel. Skull: Several sites of scalp contusion/swelling including the left frontal/supraorbital tissues and left parieto-occipital region. Large scalp hematoma. No subjacent calvarial fractures. No other acute osseous injury is seen. Sinuses/Orbits: Minimal thickening in the ethmoid air cells. No layering air-fluid levels or pneumatized secretions. Chronic bilateral mastoid effusions, right slightly greater than left with some mild hyperostotic  features compatible with this chronicity. Appearance similar to comparison imaging. No visible or suspected temporal bone fractures. Middle ear cavities are clear. Other: Few carious lesions within the dentition. CT CERVICAL FINDINGS Alignment: Cervical stabilization collar is not visualized at the time of exam. There is straightening and slight reversal of the normal cervical lordosis which may be degenerative or on a positional basis given the positioning on scout view. Reversal is centered at the C5-6 level. No evidence of traumatic listhesis. No abnormally widened, perched or jumped facets. Normal alignment of the craniocervical and atlantoaxial articulations. Skull base and vertebrae: The osseous structures appear diffusely demineralized which may limit detection of small or nondisplaced fractures. No visible skull base fractures. Dens is intact. No vertebral body height loss. Fractures of the left C5, C6 and C7 transverse processes as well as fractures of the left T1 and possibly T2 transverse processes. Additional fracture of the right posterior first rib. Soft tissues and spinal canal: Soft tissue thickening and swelling adjacent the left transverse process fractures with additional thickening along the paraspinal musculature including the left scalene levator muscle bellies. Additional contusive changes noted of the trapezius and rotator cuff musculature, better detailed on CT of the chest. Disc levels: Diffuse intervertebral disc height loss with multilevel spondylitic endplate changes throughout the cervical spine. Larger disc osteophyte complexes are present C3-4, C5-6, C6-7 and C7-T1 resulting in some mild to moderate canal stenoses. Additionally, questionable development of some ossification along the posterior longitudinal ligament at the C7-T1 and T1-T2 levels as well. Multilevel uncinate spurring and facet hypertrophic changes are present throughout the spine with moderate to severe narrowing  bilaterally C3-4 and more mild to moderate changes more diffusely throughout the cervical levels. Other:  Cervical carotid atherosclerosis is present. CT CHEST FINDINGS Cardiovascular: The aortic root is suboptimally assessed given cardiac pulsation artifact. Atherosclerotic plaque within the normal caliber aorta. No acute luminal abnormality of the imaged aorta. No periaortic stranding or hemorrhage. 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 The right subclavian artery is also heavily calcified with some high-grade segmental stenosis proximally. Additional postsurgical changes noted near the level of the carotids albeit with some extensive native atheromatous plaque. No acute traumatic abnormality of the proximal great vessels are identified. Central pulmonary arteries are normal caliber. No large central filling defects on this non tailored examination of the pulmonary arteries. Normal cardiac size. Trace pericardial fluid is likely within physiologic normal. Three-vessel coronary artery atherosclerosis is noted. No acute or worrisome major venous abnormalities are seen. Mediastinum/Nodes: No mediastinal fluid or gas. Diminutive appearance of the thyroid gland. Otherwise normal thoracic inlet. No acute abnormality of the trachea or esophagus. No worrisome mediastinal, hilar or axillary adenopathy. Lungs/Pleura: Some minimal thickening is seen in the superior most portion of the superior segment of the left lower lobe adjacent the posterior left fourth rib fracture which could reflect some mild contusive change or atelectasis. No other acute traumatic abnormality of the lung parenchyma is evident. No  pneumothorax. No effusion. No consolidative opacity. No convincing features of edema. Stable confluent opacity in the bilateral lung apices compatible  with regions of chronic pleuroparenchymal scarring and some associated architectural distortion. Stable sub solid opacity in the superior segment left lower lobe measuring 5 mm in size (5/61). 5 mm subpleural solid nodule seen in the periphery of the right lower lobe (5/107). Musculoskeletal: Fracture of the distal head left clavicle with intra-articular extension, surrounding thickening and mild widening of the [facility] joint which could reflect. Cervical fractures better detailed above. Redemonstration of the transverse process fractures T1 and possibly T2. Suspect a nondisplaced fracture of the posterior left third, fifth and sixth ribs, minimally displaced fracture of the left fourth rib posteriorly. Additional fractures of the posterior right first rib and third rib near the transverse process. No other acute traumatic osseous injury of the chest wall or imaged thoracic spine. Minimal soft tissue thickening and intercostal thickening adjacent the rib fractures. Background of diffuse multilevel discogenic and facet degenerative changes throughout the thoracic spine including some mild scoliotic curvature. Additional arthrosis bilateral shoulders. Nerve stimulator battery pack seen in the soft tissues of the right flank, leads entering the canal at the T10-11 level, and terminating posterior to the T8-9 vertebrae. CT ABDOMEN PELVIS FINDINGS Hepatobiliary: No direct hepatic injury or perihepatic hematoma. No focal liver abnormality is seen. Patient is post cholecystectomy. Slight prominence of the biliary tree likely related to reservoir effect. No calcified intraductal gallstones. Pancreas: No pancreatic contusive change or ductal disruption. No pancreatic ductal dilatation or surrounding inflammatory changes. Spleen: No direct splenic injury or perisplenic hemorrhage. Normal in size. No concerning splenic lesions. Adrenals/Urinary Tract: No convincing adrenal hemorrhage or suspicious adrenal lesion. Mild lobular  thickening of the adrenal glands may be senescent hyperplasia. No direct renal injury or perinephric hemorrhage. Kidneys are normally located with symmetric enhancementand excretion without extravasation of contrast on the excretory delayed phase imaging. No suspicious renal lesion, urolithiasis or hydronephrosis. No evidence of direct bladder injury, rupture, or other acute bladder abnormality. Stomach/Bowel: Distal esophagus, stomach and duodenum are unremarkable. No focal small bowel thickening or dilatation. Vascular/Lymphatic: No evidence of direct vascular injury in the abdomen or pelvis. Extensive atherosclerotic calcification throughout the aorta and branch vessels. No aneurysm or ectasia. No suspicious or enlarged lymph nodes in the included lymphatic chains. Reproductive: Normal appearance of the uterus and adnexal structures. Other: Focal subcutaneous soft tissue thickening in the bilateral lower quadrants, left greater than right may reflect sequela of prior injectable use. More contusive appearing soft tissue thickening and stranding is seen lateral to the left hip. No abdominopelvic free air or fluid. No bowel containing hernias. Musculoskeletal: No acute fracture or vertebral body height loss of the lumbar spine. Congenital nonfusion right L1 transverse process, benign variant. Mild levocurvature of the lumbar levels, apex L4. No traumatic listhesis. Mild retrolisthesis L4 on 5 is likely degenerative with advanced discogenic and facet degenerative changes at these levels. Additional diffuse degenerative changes throughout the lumbar spine maximal L5-S1. Bony pelvis is intact and congruent. Proximal femora are intact and normally located in the acetabula. Suspect some enthesopathic mineralization near the left lesser trochanter. Few benign bone islands throughout the pelvis. Musculature is normal and symmetric. IMPRESSION: CT head: 1. Several sites of scalp contusion/swelling including the left  frontal/supraorbital tissues and left parieto-occipital region. No subjacent calvarial fractures. 2. No acute intracranial abnormality. 3. Chronic bilateral mastoid effusions, right slightly greater than left, with some mild hyperostotic features compatible with this chronicity. No  visible or suspected temporal bone fractures. CT cervical spine: 1. Fractures of the left C5, C6 and C7 transverse processes coursing in close proximity but clearly into the transverse foramina. 2. Stranding and thickening adjacent the transverse process fractures as well as some thickening and stranding about the left paraspinal musculature. 3. No clear vertebral body fracture or height loss is seen. No traumatic listhesis. 4. 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 5. CT chest, abdomen pelvis. 1. Fractures of the left T1 and possibly T2 transverse processes. No other acute osseous injury of the thoracic or lumbar spine. 2. Suspect a nondisplaced fracture of the posterior left third, fifth and sixth ribs. Minimally displaced fracture of the left fourth rib posteriorly. Fractures of the posterior right first rib and third rib near the transverse process. No pneumothorax. Some minimal ground-glass adjacent the fourth rib fracture in the superior segment left lower lobe may reflect trace pulmonary contusion or atelectasis. 3. Fracture of the distal head left clavicle with intra-articular extension, surrounding thickening and mild widening of the AC joint. Small amount of stranding and trace hemorrhage adjacent the clavicular fracture but without clear vascular injury identified within the limitations of this exam. 4. Contusive changes of the left hip. No large hematoma or site of active contrast extravasation. 5. 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 Additional high-grade segmental. 6. Focal skin thickening and subcutaneous soft tissue along the anterior abdominal wall, likely related to injectable use/insulin administration. 7. Prior cholecystectomy. 8. Aortic Atherosclerosis (ICD10-I70.0). These results were called by telephone at the time of interpretation on 03/07/2020 at 11:42 pm to provider [facility] , who verbally acknowledged these results. Electronically Signed   By: Lovena Le M.D.   On: 03/07/2020 23:43   CT Abdomen Pelvis W Contrast  Result Date: 03/07/2020 CLINICAL DATA:  Fall down 5 steps, head injury laceration, left chest and shoulder pain, no loss of consciousness EXAM: CT HEAD WITHOUT CONTRAST CT CERVICAL SPINE WITHOUT CONTRAST CT CHEST, ABDOMEN AND PELVIS WITH CONTRAST TECHNIQUE: Contiguous axial images were obtained from the base of the skull through the vertex without intravenous contrast. Multidetector CT imaging of the cervical spine was performed without intravenous contrast. Multiplanar CT image reconstructions were also generated. Multidetector CT imaging of the chest, abdomen and pelvis was performed following the standard protocol during bolus administration of intravenous contrast. CONTRAST:  129mL OMNIPAQUE IOHEXOL 300 MG/ML  SOLN COMPARISON:  CT head 02/04/2020, CTA head neck 11/25/2019 FINDINGS: CT HEAD FINDINGS Brain: No evidence of acute infarction, hemorrhage, hydrocephalus, extra-axial collection, visible mass lesion or mass effect. Vascular: Atherosclerotic calcification of the carotid siphons. No hyperdense vessel. Skull: Several sites of scalp contusion/swelling including the left frontal/supraorbital tissues and left parieto-occipital region. Large scalp hematoma. No subjacent calvarial fractures. No other acute osseous injury is seen. Sinuses/Orbits: Minimal thickening in the ethmoid air cells.  No layering air-fluid levels or pneumatized secretions. Chronic bilateral mastoid effusions, right slightly greater than left with some mild hyperostotic features compatible with this chronicity. Appearance similar to comparison imaging. No visible or suspected temporal bone fractures. Middle ear cavities are clear. Other: Few carious lesions within the dentition. CT CERVICAL FINDINGS Alignment: Cervical stabilization collar is not visualized at the time of exam. There is straightening and slight reversal  of the normal cervical lordosis which may be degenerative or on a positional basis given the positioning on scout view. Reversal is centered at the C5-6 level. No evidence of traumatic listhesis. No abnormally widened, perched or jumped facets. Normal alignment of the craniocervical and atlantoaxial articulations. Skull base and vertebrae: The osseous structures appear diffusely demineralized which may limit detection of small or nondisplaced fractures. No visible skull base fractures. Dens is intact. No vertebral body height loss. Fractures of the left C5, C6 and C7 transverse processes as well as fractures of the left T1 and possibly T2 transverse processes. Additional fracture of the right posterior first rib. Soft tissues and spinal canal: Soft tissue thickening and swelling adjacent the left transverse process fractures with additional thickening along the paraspinal musculature including the left scalene levator muscle bellies. Additional contusive changes noted of the trapezius and rotator cuff musculature, better detailed on CT of the chest. Disc levels: Diffuse intervertebral disc height loss with multilevel spondylitic endplate changes throughout the cervical spine. Larger disc osteophyte complexes are present C3-4, C5-6, C6-7 and C7-T1 resulting in some mild to moderate canal stenoses. Additionally, questionable development of some ossification along the posterior longitudinal ligament at the C7-T1 and  T1-T2 levels as well. Multilevel uncinate spurring and facet hypertrophic changes are present throughout the spine with moderate to severe narrowing bilaterally C3-4 and more mild to moderate changes more diffusely throughout the cervical levels. Other:  Cervical carotid atherosclerosis is present. CT CHEST FINDINGS Cardiovascular: The aortic root is suboptimally assessed given cardiac pulsation artifact. Atherosclerotic plaque within the normal caliber aorta. No acute luminal abnormality of the imaged aorta. No periaortic stranding or hemorrhage. 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 The right subclavian artery is also heavily calcified with some high-grade segmental stenosis proximally. Additional postsurgical changes noted near the level of the carotids albeit with some extensive native atheromatous plaque. No acute traumatic abnormality of the proximal great vessels are identified. Central pulmonary arteries are normal caliber. No large central filling defects on this non tailored examination of the pulmonary arteries. Normal cardiac size. Trace pericardial fluid is likely within physiologic normal. Three-vessel coronary artery atherosclerosis is noted. No acute or worrisome major venous abnormalities are seen. Mediastinum/Nodes: No mediastinal fluid or gas. Diminutive appearance of the thyroid gland. Otherwise normal thoracic inlet. No acute abnormality of the trachea or esophagus. No worrisome mediastinal, hilar or axillary adenopathy. Lungs/Pleura: Some minimal thickening is seen in the superior most portion of the superior segment of the left lower lobe adjacent the posterior left fourth rib fracture which could reflect some mild contusive change or atelectasis. No other acute traumatic abnormality of the lung parenchyma is evident. No  pneumothorax. No effusion. No consolidative opacity. No convincing features of edema. Stable confluent opacity in the bilateral lung apices compatible with regions of chronic pleuroparenchymal scarring and some associated architectural distortion. Stable sub solid opacity in the superior segment left lower lobe measuring 5 mm in size (5/61). 5 mm subpleural solid nodule seen in the periphery of the right lower lobe (5/107). Musculoskeletal: Fracture of the distal head left clavicle with intra-articular extension, surrounding thickening and mild widening of the [facility] joint which could reflect. Cervical fractures better detailed above. Redemonstration of the transverse process fractures T1 and possibly T2. Suspect a nondisplaced fracture of the posterior left third, fifth and sixth ribs, minimally  displaced fracture of the left fourth rib posteriorly. Additional fractures of the posterior right first rib and third rib near the transverse process. No other acute traumatic osseous injury of the chest wall or imaged thoracic spine. Minimal soft tissue thickening and intercostal thickening adjacent the rib fractures. Background of diffuse multilevel discogenic and facet degenerative changes throughout the thoracic spine including some mild scoliotic curvature. Additional arthrosis bilateral shoulders. Nerve stimulator battery pack seen in the soft tissues of the right flank, leads entering the canal at the T10-11 level, and terminating posterior to the T8-9 vertebrae. CT ABDOMEN PELVIS FINDINGS Hepatobiliary: No direct hepatic injury or perihepatic hematoma. No focal liver abnormality is seen. Patient is post cholecystectomy. Slight prominence of the biliary tree likely related to reservoir effect. No calcified intraductal gallstones. Pancreas: No pancreatic contusive change or ductal disruption. No pancreatic ductal dilatation or surrounding inflammatory changes. Spleen: No direct splenic injury or perisplenic hemorrhage.  Normal in size. No concerning splenic lesions. Adrenals/Urinary Tract: No convincing adrenal hemorrhage or suspicious adrenal lesion. Mild lobular thickening of the adrenal glands may be senescent hyperplasia. No direct renal injury or perinephric hemorrhage. Kidneys are normally located with symmetric enhancementand excretion without extravasation of contrast on the excretory delayed phase imaging. No suspicious renal lesion, urolithiasis or hydronephrosis. No evidence of direct bladder injury, rupture, or other acute bladder abnormality. Stomach/Bowel: Distal esophagus, stomach and duodenum are unremarkable. No focal small bowel thickening or dilatation. Vascular/Lymphatic: No evidence of direct vascular injury in the abdomen or pelvis. Extensive atherosclerotic calcification throughout the aorta and branch vessels. No aneurysm or ectasia. No suspicious or enlarged lymph nodes in the included lymphatic chains. Reproductive: Normal appearance of the uterus and adnexal structures. Other: Focal subcutaneous soft tissue thickening in the bilateral lower quadrants, left greater than right may reflect sequela of prior injectable use. More contusive appearing soft tissue thickening and stranding is seen lateral to the left hip. No abdominopelvic free air or fluid. No bowel containing hernias. Musculoskeletal: No acute fracture or vertebral body height loss of the lumbar spine. Congenital nonfusion right L1 transverse process, benign variant. Mild levocurvature of the lumbar levels, apex L4. No traumatic listhesis. Mild retrolisthesis L4 on 5 is likely degenerative with advanced discogenic and facet degenerative changes at these levels. Additional diffuse degenerative changes throughout the lumbar spine maximal L5-S1. Bony pelvis is intact and congruent. Proximal femora are intact and normally located in the acetabula. Suspect some enthesopathic mineralization near the left lesser trochanter. Few benign bone islands  throughout the pelvis. Musculature is normal and symmetric. IMPRESSION: CT head: 1. Several sites of scalp contusion/swelling including the left frontal/supraorbital tissues and left parieto-occipital region. No subjacent calvarial fractures. 2. No acute intracranial abnormality. 3. Chronic bilateral mastoid effusions, right slightly greater than left, with some mild hyperostotic features compatible with this chronicity. No visible or suspected temporal bone fractures. CT cervical spine: 1. Fractures of the left C5, C6 and C7 transverse processes coursing in close proximity but clearly into the transverse foramina. 2. Stranding and thickening adjacent the transverse process fractures as well as some thickening and stranding about the left paraspinal musculature. 3. No clear vertebral body fracture or height loss is seen. No traumatic listhesis. 4. 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 5. CT chest, abdomen pelvis. 1. Fractures of the left T1 and possibly T2 transverse processes. No other acute osseous injury of the thoracic or lumbar spine. 2. Suspect a nondisplaced fracture  of the posterior left third, fifth and sixth ribs. Minimally displaced fracture of the left fourth rib posteriorly. Fractures of the posterior right first rib and third rib near the transverse process. No pneumothorax. Some minimal ground-glass adjacent the fourth rib fracture in the superior segment left lower lobe may reflect trace pulmonary contusion or atelectasis. 3. Fracture of the distal head left clavicle with intra-articular extension, surrounding thickening and mild widening of the AC joint. Small amount of stranding and trace hemorrhage adjacent the clavicular fracture but without clear vascular injury identified within the limitations of this exam. 4. Contusive changes of the left hip. No large hematoma or site of active contrast  extravasation. 5. 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 Additional high-grade segmental. 6. Focal skin thickening and subcutaneous soft tissue along the anterior abdominal wall, likely related to injectable use/insulin administration. 7. Prior cholecystectomy. 8. Aortic Atherosclerosis (ICD10-I70.0). These results were called by telephone at the time of interpretation on 03/07/2020 at 11:42 pm to provider [facility] , who verbally acknowledged these results. Electronically Signed   By: Lovena Le M.D.   On: 03/07/2020 23:43   DG Shoulder Left  Result Date: 03/07/2020 CLINICAL DATA:  Pain status post fall EXAM: LEFT SHOULDER - 2+ VIEW COMPARISON:  None. FINDINGS: There is an acute, nondisplaced fracture of the distal left clavicle. There are degenerative changes of the acromioclavicular joint and left glenohumeral joint. There is calcific tendinopathy of the rotator cuff. There is no glenohumeral dislocation. IMPRESSION: 1. Acute, nondisplaced fracture of the distal left clavicle. 2. Degenerative changes of the acromioclavicular joint and left glenohumeral joint. 3. Calcific tendinopathy of the rotator cuff. Electronically Signed   By: Constance Holster M.D.   On: 03/07/2020 23:02   DG Hip Unilat W or Wo Pelvis 2-3 Views Right  Result Date: 03/07/2020 CLINICAL DATA:  Pain status post fall EXAM: DG HIP (WITH OR WITHOUT PELVIS) 2-3V RIGHT COMPARISON:  None. FINDINGS: There is no evidence of hip fracture or dislocation. There is no evidence of arthropathy or other focal bone abnormality. IMPRESSION: Negative. Electronically Signed   By: Constance Holster M.D.   On: 03/07/2020 23:06    Review of Systems  HENT: Negative for ear discharge, ear pain, hearing loss and tinnitus.   Eyes: Negative  for photophobia and pain.  Respiratory: Negative for cough and shortness of breath.   Cardiovascular: Positive for chest pain.  Gastrointestinal: Negative for abdominal pain, nausea and vomiting.  Genitourinary: Negative for dysuria, flank pain, frequency and urgency.  Musculoskeletal: Positive for arthralgias (Left shoulder) and neck pain. Negative for back pain and myalgias.  Neurological: Negative for dizziness and headaches.  Hematological: Does not bruise/bleed easily.  Psychiatric/Behavioral: The patient is not nervous/anxious.    Blood pressure (!) 150/110, pulse (!) 102, temperature 98.2 F (36.8 C), temperature source Oral, resp. rate 18, height 5\' 7"  (1.702 m), weight 88.5 kg, SpO2 97 %. Physical Exam Constitutional:      General: She is not in acute distress.    Appearance: She is well-developed. She is not diaphoretic.  HENT:     Head: Normocephalic and atraumatic.  Eyes:     General: No scleral icterus.       Right eye: No discharge.        Left eye: No discharge.  Conjunctiva/sclera: Conjunctivae normal.  Cardiovascular:     Rate and Rhythm: Normal rate and regular rhythm.  Pulmonary:     Effort: Pulmonary effort is normal. No respiratory distress.  Musculoskeletal:     Cervical back: Normal range of motion.     Comments: Left shoulder, elbow, wrist, digits- Posterior shoulder abrasion, mod TTP distal clav, no skin tenting, in sling, no instability, no blocks to motion  Sens  Ax/R/M/U intact  Mot   Ax/ R/ PIN/ M/ AIN/ U intact  Rad 2+  Skin:    General: Skin is warm and dry.  Neurological:     Mental Status: She is alert.  Psychiatric:        Behavior: Behavior normal.     Assessment/Plan: Left clavicle fx -- Plan non-operative treatment with sling and NWB. F/u with Dr. Griffin Basil in about 2 weeks. Other injuries including c- and t-spine fxs and multiple rib fxs -- per trauma service Multiple medical problems including COPD, DM, HTN, and thyroid disease --  per trauma service    Lisette Abu, PA-C Orthopedic Surgery [phone] 03/09/2020, 9:25 AM

## 2020-03-10 LAB — GLUCOSE, CAPILLARY
Glucose-Capillary: 153 mg/dL — ABNORMAL HIGH (ref 70–99)
Glucose-Capillary: 157 mg/dL — ABNORMAL HIGH (ref 70–99)
Glucose-Capillary: 123 mg/dL — ABNORMAL HIGH (ref 70–99)
Glucose-Capillary: 147 mg/dL — ABNORMAL HIGH (ref 70–99)
Glucose-Capillary: 172 mg/dL — ABNORMAL HIGH (ref 70–99)

## 2020-03-10 NOTE — Progress Notes (Signed)
Physical Therapy Treatment Patient Details Name: Terri Li MRN: [id number] DOB: 1961-01-17 Today's Date: 03/10/2020    History of Present Illness Pt is a 59 y.o. female admitted 03/07/20 after fall down stairs sustaining L clavicle fx, bilateral rib fxs, C5-7 transverse process fxs, T1-2 transverse process fxs. PMH includes current tobacco use, COPD, DM, HTN, spinal cord stimulator implant.    PT Comments    Pt up in hallway on arrival, cx collar observed to be loose and pt unmasked, pt assisted back to room and agreeable to therapy session. Pt given instruction on cervical precautions and safety as well as handout to reinforce safety with transfers/ADLs to prevent further injury. Pt compliant with LUE NWB precautions t/o session. Pt performed transfers and gait with min guard progressing to Supervision, gait progressed to 127ft with no AD and 4 steps using R rail and no LOB. Pt continues to benefit from PT services to progress toward functional mobility goals. D/C recs below remain appropriate, anticipate pt will need increased PRN assist with mobility as well as ADLs due to precautions. Pt would benefit from additional OT session as well to review safety with bathing/ADLs.  Follow Up Recommendations  Home health PT;Supervision for mobility/OOB     Equipment Recommendations  3in1 (PT);Other (comment) (tub/bench; pt refusing cane but could benefit)    Recommendations for Other Services       Precautions / Restrictions Precautions Precautions: Fall;Cervical;Other (comment) Precaution Booklet Issued: Yes (comment) (cx precaution handout given and L hand/wrist/elbow exercises) Precaution Comments: Abdominal for comfort (rib fxs) Required Braces or Orthoses: Cervical Brace;Sling Cervical Brace: Hard collar Restrictions Weight Bearing Restrictions: Yes LUE Weight Bearing: Non weight bearing Other Position/Activity Restrictions: ok for gentle ROM in sling    Mobility  Bed  Mobility Overal bed mobility: Needs Assistance Bed Mobility: Supine to Sit     Supine to sit: Supervision     General bed mobility comments: cues for safety/log roll and cx prec handout given  Transfers Overall transfer level: Needs assistance Equipment used: None Transfers: Sit to/from Stand Sit to Stand: Supervision;Min guard         General transfer comment: Increased time and effort preparing to stand secondary to c/o rib pain, min guard for balance initially progressing to Supervision; pt refusing bed/chair alarms, RN aware  Ambulation/Gait Ambulation/Gait assistance: Min guard;Supervision Gait Distance (Feet): 150 Feet (53ft with min guard, progressing to Supervision for longer t) Assistive device: 1 person hand held assist;None Gait Pattern/deviations: Step-through pattern;Decreased stride length;Trunk flexed;Antalgic Gait velocity: Decreased   General Gait Details: pt refusing to trial cane; Supervision for majority of gait trial, cues for activity pacing, mildly antalgic gait but no overt LOB   Stairs Stairs: Yes Stairs assistance: Supervision Stair Management: One rail Right;Step to pattern;Forwards Number of Stairs: 4 General stair comments: Ascend/descend steps with RUE rail support, Supervision for safety, no LOB   Wheelchair Mobility    Modified Rankin (Stroke Patients Only)       Balance Overall balance assessment: Needs assistance;History of Falls Sitting-balance support: Single extremity supported;Feet supported Sitting balance-Leahy Scale: Good Sitting balance - Comments: sat unsupported no LOB   Standing balance support: No upper extremity supported;During functional activity Standing balance-Leahy Scale: Good Standing balance comment: no LOB during gait and stair training, RUE support with stairs and no support with flat surface walking  Cognition Arousal/Alertness: Awake/alert Behavior During Therapy:  WFL for tasks assessed/performed Overall Cognitive Status: Within Functional Limits for tasks assessed Area of Impairment: Safety/judgement;Problem solving;Awareness                         Safety/Judgement: Decreased awareness of safety;Decreased awareness of deficits   Problem Solving: Requires verbal cues General Comments: pt seen ambulating in hallway with no mask on and gown not completely closed in back. pt assisted back to room and given extra gown for back prior to initiating futher mobility; pt more concerned with showering/cleaning blood out of hair than maintaining precautions and HCC noted to be loose upon entry for session -tightened for proper fit.      Exercises Other Exercises Other Exercises: Incentive spirometer x5 (good technique) 1300; pt given LUE exercise handout for hand/forearm/elbow exercises only while in sling    General Comments General comments (skin integrity, edema, etc.): cx loose upon entry to room, repositioned for proper fit; cx handout given and verbal review for pillow splinting when coughing; pulling up to 1300 on IS      Pertinent Vitals/Pain Pain Assessment: 0-10 Pain Score: 9  Pain Location: Ribs > shoulder/back Pain Descriptors / Indicators: Grimacing;Guarding Pain Intervention(s): Monitored during session;Patient requesting pain meds-RN notified;Repositioned    Home Living                      Prior Function            PT Goals (current goals can now be found in the care plan section) Acute Rehab PT Goals Patient Stated Goal: Stay with sister and see dog Shea Evans) PT Goal Formulation: With patient Time For Goal Achievement: 03/23/20 Potential to Achieve Goals: Good Progress towards PT goals: Progressing toward goals    Frequency    Min 5X/week      PT Plan Current plan remains appropriate    Co-evaluation              AM-PAC PT "6 Clicks" Mobility   Outcome Measure  Help needed turning from your  back to your side while in a flat bed without using bedrails?: None Help needed moving from lying on your back to sitting on the side of a flat bed without using bedrails?: None Help needed moving to and from a bed to a chair (including a wheelchair)?: A Little Help needed standing up from a chair using your arms (e.g., wheelchair or bedside chair)?: A Little Help needed to walk in hospital room?: A Little Help needed climbing 3-5 steps with a railing? : None 6 Click Score: 21    End of Session Equipment Utilized During Treatment: Cervical collar;Other (comment) (LUE sling) Activity Tolerance: Patient tolerated treatment well Patient left: in chair;with call bell/phone within reach Nurse Communication: Mobility status PT Visit Diagnosis: Other abnormalities of gait and mobility (R26.89);History of falling (Z91.81)     Time: 1051-1110 PT Time Calculation (min) (ACUTE ONLY): 19 min  Charges:  $Gait Training: 8-22 mins                     Juwuan Sedita P., PTA Acute Rehabilitation Services Pager: [phone] Office: [place] 03/10/2020, 11:41 AM

## 2020-03-10 NOTE — Discharge Instructions (Signed)
How to Use a Hard Cervical Collar A hard cervical collar limits the movement of the top part of your spine (cervical spine). The collar holds your head and neck in a straight position. A cervical collar may be used to treat:  A fracture in the neck.  Damage to the ligaments. Ligaments are tissues that connect bones.  Injury to the spinal cord. There are several types of hard cervical collars. Follow the manufacturer's instructions for use. These are general guidelines. What are the risks? Wearing a cervical collar is safe. However, problems may occur, including:  Skin breakdown.  Sores that form due to rubbing or pressure on the skin (pressure ulcers).  Pain.  Difficulty breathing.  Worsening of your condition if the collar is not placed correctly.  Increased risk of inhaling food or liquid into your lungs (aspiration). Supplies needed:  Extra cervical collar and replacement pads.  Ice.  Plastic bag.  Towel.  A watertight covering to put over the collar during bathing, if needed. How to use a cervical collar  Wear the cervical collar as told by your health care provider. Do not remove it unless told to do so by your health care provider.  You may be directed to remove it only when you check your skin and change the pads. While the collar is off: ? Ask another person to assist you if needed. ? Keep your head and neck straight.  Do not bend your neck or turn your head. ? Check your skin daily for red areas. Ask for help or use a mirror to check areas you cannot see. ? After checking your skin, wear the extra cervical collar while cleaning and changing the pads of the other collar.  Change the pads daily or more often if they become wet or dirty. Keep a clean set of replacement pads.  Do not let hard plastic edges touch your skin. Cover them with a soft pad.  If your cervical collar is not waterproof: ? Do not let it get wet. ? Cover it with a watertight covering when  you take a bath or a shower. Follow these instructions at home:   Put ice on the injured area to manage pain, stiffness, and swelling. ? Do not remove your cervical collar unless told to do so by your health care provider. ? Put ice in a plastic bag. ? Place a towel between your skin and the bag or between your cervical collar and the bag. ? Leave the ice on for 20 minutes, 2-3 times a day for the first 2 days after your injury.  Do not drive any vehicle until your health care provider approves.  Keep all follow-up visits as told by your health care provider. This is important. Any delay in getting the care that you need can prevent proper healing of your injury. Contact a health care provider if you have:  Red areas of skin under your cervical collar.  Pain that is not controlled with your medicines. Get help right away if you have:  Numbness or weakness in your arms or legs.  Difficulty breathing. These symptoms may represent a serious problem that is an emergency. Do not wait to see if the symptoms will go away. Get medical help right away. Call your local emergency services (911 in the [place] Do not drive yourself to the hospital. Summary  A cervical collar is a device that supports the chin and the back of the head. It restricts movement of the neck to  prevent more damage after a severe injury.  A cervical collar may be used to treat a fracture in the neck, damage to tissues that hold bones together (ligaments), or injury to the spinal cord.  Wear the cervical collar as told by your health care provider. Ask if you may remove the collar to shower, bathe, or eat, or to put ice on your neck. This information is not intended to replace advice given to you by your health care provider. Make sure you discuss any questions you have with your health care provider. Document Revised: 02/01/2019 Document Reviewed: 03/07/2017 Elsevier Patient Education  2020 [employer].    Clavicle  Fracture  A clavicle fracture is a broken collarbone. The collarbone is the long bone that connects your shoulder to your chest wall. A broken collarbone may be treated with a sling or with surgery. Treatment depends on whether the broken ends of the bone are out of place or not. Follow these instructions at home: If you have a sling:  Wear the sling as told by your doctor. Take it off only as told by your doctor.  Loosen the sling if your fingers tingle, become numb, or turn cold and blue.  Do not lift your arm. Keep it across your chest.  Keep the sling clean.  Ask your doctor if you may take off the sling for bathing. ? If your sling is not waterproof, do not let it get wet. Cover the sling with a watertight covering if you take a bath or a shower while wearing it. ? If you may take off your sling when you take a bath or a shower, keep your shoulder in the same position as when the sling is on. Managing pain, stiffness, and swelling   If told, put ice on the injured area: ? If you have a removable sling, take it off as told by your doctor. ? Put ice in a plastic bag. ? Place a towel between your skin and the bag. ? Leave the ice on for 20 minutes, 2-3 times a day. Activity  Avoid activities that make your symptoms worse for 4-6 weeks, or as long as told.  Ask your doctor when it is safe for you to drive.  Do exercises as told by your doctor. General instructions  Do not use any products that contain nicotine or tobacco, such as cigarettes and e-cigarettes. These can delay bone healing. If you need help quitting, ask your doctor.  Take over-the-counter and prescription medicines only as told by your doctor.  Keep all follow-up visits as told by your doctor. This is important. Contact a doctor if:  Your medicine is not making you feel less pain.  Your medicine is not making swelling better. Get help right away if:  Your cannot feel your arm (your arm is numb).  Your  arm is cold.  Your arm is a lighter color than normal. Summary  A clavicle fracture is a broken collarbone. The collarbone is the long bone that connects your shoulder to your chest wall.  Treatment depends on whether the broken ends of the bone are out of place or not.  If you have a sling, wear it as told by your doctor.  Do exercises when your doctor says you can. The exercises will help your arm get strong and move like it used to. This information is not intended to replace advice given to you by your health care provider. Make sure you discuss any questions you have  with your health care provider. Document Revised: 03/28/2017 Document Reviewed: 03/04/2016 Elsevier Patient Education  2020 [employer].

## 2020-03-10 NOTE — Discharge Summary (Signed)
[facility] Discharge Summary   Patient ID: ECE CUMBERLAND MRN: [id number] DOB/AGE: [birth year] 58 y.o.  Admit date: 03/07/2020 Discharge date: 03/10/2020   Discharge Diagnosis Patient Active Problem List   Diagnosis Date Noted  . Multiple rib fractures 03/08/2020  . Trauma 03/08/2020   Consultants Neurosurgery Orthopedic surgery   Procedures None  HPI: Terri Li is a 59 yo female who presented as a transfer from [facility] after a fall at home. 11/9 around 7pm, she turned and missed a step and fell down a single flight of stairs. She remembers the incident and denies loss of consciousness. Says she did not hit her head.   Hospital Course:  maging workup showed multiple C spine and L spine transverse process fractures, as well as multiple rib fractures. She was transferred to [facility] for trauma evaluation. See below for full list of identified injuries along with their management:   L C5-7 transverse process fractures - neurosurgery consulted. no acute surgical needs, continue c-collar and F/U with Dr. Vertell Limber outpatient  L T1 and T2 transverse process fractures  L 3,5 and 6 rib fractures - pulm toilet, pain control, chest x-ray performed hospital day #1 was negative for the interval development of a pneumothorax.  R 1 and 3 rib fractures - pulm toilet, pain control  L clavicle fracture - non-operative management with a sling, NWB, and F/U with Dr. Griffin Basil 2 weeks  Physical Exam: up walking around General appearance: cooperative, NAD Neck: collar Chest wall: left sided chest wall tenderness Pulm: normal effort on room air, CTAB Cardio: regular rate and rhythm GI: soft, NT Neurologic: Mental status: Alert, oriented, thought content appropriate  Allergies as of 03/11/2020      Reactions   Hydrocodone Itching   Codeine Itching      Medication List    STOP taking these medications   naproxen 500 MG tablet Commonly known as: NAPROSYN   Nucynta ER  100 MG 12 hr tablet Generic drug: tapentadol   prochlorperazine 5 MG tablet Commonly known as: COMPAZINE   zolpidem 10 MG tablet Commonly known as: AMBIEN     TAKE these medications   atorvastatin 80 MG tablet Commonly known as: LIPITOR Take 80 mg by mouth daily.   celecoxib 100 MG capsule Commonly known as: CELEBREX Take 100 mg by mouth 2 (two) times daily.   clopidogrel 75 MG tablet Commonly known as: PLAVIX Take 75 mg by mouth daily.   Gralise 600 MG Tabs Generic drug: Gabapentin (Once-Daily) Take 1,800 mg by mouth daily.   HumaLOG KwikPen 100 UNIT/ML KwikPen Generic drug: insulin lispro Inject 15-50 Units into the skin 3 (three) times daily. Inject 15 units BID then Inject 50 units at bedtime   levothyroxine 175 MCG tablet Commonly known as: SYNTHROID Take 175 mcg by mouth daily.   lisinopril 10 MG tablet Commonly known as: ZESTRIL Take 10 mg by mouth daily.   metFORMIN 500 MG tablet Commonly known as: GLUCOPHAGE Take 1,000-1,500 mg by mouth 2 (two) times daily with a meal. Take 2 tablet (1000 mg) and Take 3 tablets (1500 mg) at bedtime   nicotine 14 mg/24hr patch Commonly known as: NICODERM CQ - dosed in mg/24 hours Place 1 patch (14 mg total) onto the skin daily. Start taking on: March 12, 2020   Toujeo SoloStar 300 UNIT/ML Solostar Pen Generic drug: insulin glargine (1 Unit Dial) Inject 60 Units into the skin at bedtime.   traMADol 50 MG tablet Commonly known as: ULTRAM Take  1-2 tablets (50-100 mg total) by mouth every 6 (six) hours as needed (50mg  for moderate pain, 100mg  for severe pain).            Durable Medical Equipment  (From admission, onward)         Start     Ordered   03/10/20 1036  For home use only DME Tub bench  Once        03/10/20 1036   03/10/20 1035  For home use only DME 3 n 1  Once        03/10/20 1036            Follow-up Information    Hiram Gash, MD. Schedule an appointment as soon as possible for a  visit in 2 week(s).   Specialty: Orthopedic Surgery Contact information: [address] [place] [address] [phone]        Erline Levine, MD Follow up in 4 week(s).   Specialty: Neurosurgery Why: Call and schedule a follow up appointment to be seen in 4 weeks.  Contact information: [address] [place] [address] [phone]               Signed: Obie Dredge, [facility] 03/10/2020, 2:55 PM

## 2020-03-10 NOTE — Progress Notes (Signed)
Patient ID: Terri Li, female   DOB: [birth year], 59 y.o.   MRN: [id number]     Subjective: Sitting up on side of bed Did well with therapies She is arranging help for home  ROS negative except as listed above. Objective: Vital signs in last 24 hours: Temp:  [97.6 F (36.4 C)-98.6 F (37 C)] 97.6 F (36.4 C) (11/12 0851) Pulse Rate:  [79-91] 85 (11/12 0851) Resp:  [16-18] 17 (11/12 0851) BP: (101-128)/(71-84) 128/84 (11/12 0851) SpO2:  [93 %-95 %] 95 % (11/12 0851) Last BM Date: 03/07/20  Intake/Output from previous day: 11/11 0701 - 11/12 0700 In: 720 [P.O.:720] Out: -  Intake/Output this shift: No intake/output data recorded.  General appearance: cooperative Neck: collar Chest wall: left sided chest wall tenderness Cardio: regular rate and rhythm GI: soft, NT Neurologic: Mental status: Alert, oriented, thought content appropriate  Lab Results: CBC  Recent Labs    03/07/20 2041 03/09/20 1036  WBC 10.7* 5.8  HGB 12.7 10.4*  HCT 39.9 32.6*  PLT 340 268   BMET Recent Labs    03/07/20 2041 03/09/20 1036  NA 136 134*  K 3.7 3.9  CL 99 96*  CO2 25 27  GLUCOSE 57* 104*  BUN 16 9  CREATININE 0.90 0.83  CALCIUM 9.3 8.9   PT/INR No results for input(s): LABPROT, INR in the last 72 hours. ABG No results for input(s): PHART, HCO3 in the last 72 hours.  Invalid input(s): PCO2, PO2  Studies/Results: No results found.  Anti-infectives: Anti-infectives (From admission, onward)   None      Assessment/Plan: 59 yo female presenting after a fall down stairs with multiple injuries: L C5-7 transverse process fractures - collar and F/U with Dr. Vertell Limber L T1 and T2 transverse process fractures L 3,5 and 6 rib fractures - pulm toilet, pain control R 1 and 3 rib fractures - pulm toilet, pain control L clavicle fracture - sling and F/U with Dr. Griffin Basil 2 weeks  Therapies cleared for [facility]. She is arranging help at home. D/C this PM. I will order DMEs.   LOS: 2  days    Georganna Skeans, MD, MPH, FACS Trauma & General Surgery Use [email or web address] to contact on call provider  03/10/2020

## 2020-03-11 ENCOUNTER — Encounter (HOSPITAL_COMMUNITY): Payer: Self-pay

## 2020-03-11 LAB — GLUCOSE, CAPILLARY
Glucose-Capillary: 148 mg/dL — ABNORMAL HIGH (ref 70–99)
Glucose-Capillary: 180 mg/dL — ABNORMAL HIGH (ref 70–99)
Glucose-Capillary: 182 mg/dL — ABNORMAL HIGH (ref 70–99)
Glucose-Capillary: 224 mg/dL — ABNORMAL HIGH (ref 70–99)

## 2020-03-11 MED ORDER — TRAMADOL HCL 50 MG PO TABS
50.0000 mg | ORAL_TABLET | Freq: Four times a day (QID) | ORAL | 0 refills | Status: DC | PRN
Start: 2020-03-11 — End: 2020-05-10

## 2020-03-11 MED ORDER — NICOTINE 14 MG/24HR TD PT24: 14.0000 mg | MEDICATED_PATCH | Freq: Every day | TRANSDERMAL | 0 refills | Status: AC

## 2020-03-11 NOTE — Progress Notes (Addendum)
Occupational Therapy Treatment Patient Details Name: Terri Li MRN: [id number] DOB: 12-18-1960 Today's Date: 03/11/2020    History of present illness Pt is a 59 y.o. female admitted 03/07/20 after fall down stairs sustaining L clavicle fx, bilateral rib fxs, C5-7 transverse process fxs, T1-2 transverse process fxs. PMH includes current tobacco use, COPD, DM, HTN, spinal cord stimulator implant.   OT comments  Pt. Seen for skilled OT treatment session.  Pt. Preparing to d/c upon arrival.  Provided education and demo of proper donning and positioning of hard collar and sling.  Pt. Able to verbalize understanding.  Reviewed plans for toileting, b/d, along with bathing.  Pt. Verbalizing cervical precautions. "I know things are going to be different now"  Pt. Reports she will be at her sisters house upon d/c.  Will have assistance with LB adls prn.  Asking about her cough and how her sore ribs affecting productive cough.  Advised her to follow up with rn/md.  No other questions for OT.  Eager for d/c when able.      Follow Up Recommendations  Home health OT;Supervision/Assistance - 24 hour    Equipment Recommendations  3 in 1 bedside commode;Tub/shower bench    Recommendations for Other Services      Precautions / Restrictions Precautions Precautions: Fall;Cervical;Other (comment) Precaution Comments: Abdominal for comfort (rib fxs) Required Braces or Orthoses: Cervical Brace;Sling Cervical Brace: Hard collar       Mobility Bed Mobility               General bed mobility comments: seated eob  Transfers                      Balance                                           ADL either performed or assessed with clinical judgement   ADL Overall ADL's : Needs assistance/impaired                 Upper Body Dressing : Moderate assistance;Sitting Upper Body Dressing Details (indicate cue type and reason): educated on proper sling  positioning and assisted pt. with re donning sling.  also reviewed hard collar positioning. noted to be loose and turned to the left side.                   General ADL Comments: educated and demonstrated sling and hard collar positioning as pt. was found to have both items on incorreclty.  verbalized understanding once placed in new position.  reviewed plans for ub/ld dressing.  reports she will be staying with her sister who can assist as needed.  declined toileting stated she had just gone prior to my arrival.     Vision       Perception     Praxis      Cognition Arousal/Alertness: Awake/alert Behavior During Therapy: WFL for tasks assessed/performed Overall Cognitive Status: Within Functional Limits for tasks assessed                                          Exercises     Shoulder Instructions       General Comments      Pertinent Vitals/ Pain  Pain Assessment: No/denies pain  Home Living                                          Prior Functioning/Environment              Frequency  Min 2X/week        Progress Toward Goals  OT Goals(current goals can now be found in the care plan section)  Progress towards OT goals: Progressing toward goals     Plan      Co-evaluation                 AM-PAC OT "6 Clicks" Daily Activity     Outcome Measure   Help from another person eating meals?: A Little Help from another person taking care of personal grooming?: A Little Help from another person toileting, which includes using toliet, bedpan, or urinal?: A Little Help from another person bathing (including washing, rinsing, drying)?: A Lot Help from another person to put on and taking off regular upper body clothing?: A Little Help from another person to put on and taking off regular lower body clothing?: A Lot 6 Click Score: 16    End of Session Equipment Utilized During Treatment: Cervical collar;Other  (comment) (L sling)  OT Visit Diagnosis: Unsteadiness on feet (R26.81);Other abnormalities of gait and mobility (R26.89);Muscle weakness (generalized) (M62.81);History of falling (Z91.81);Pain Pain - Right/Left: Left Pain - part of body: Shoulder   Activity Tolerance Patient tolerated treatment well   Patient Left in bed;with call bell/phone within reach;Other (comment) (pt. seated eob preparing for d/c home)   Nurse Communication          Time: 3976-7341 OT Time Calculation (min): 9 min  Charges: OT General Charges $OT Visit: 1 Visit OT Treatments $Self Care/Home Management : 8-22 mins  Sonia Baller, COTA/L Acute Rehabilitation [phone]   Janice Coffin 03/11/2020, 12:46 PM

## 2020-03-11 NOTE — Progress Notes (Addendum)
59 yo F admitted after falling down the stairs and she sustained a L clavicle fx, bilateral rib fxs, C5-7 transverse process fxs, T1-2 transverse process fxs. No surgical needs for L C5-7 transverse process or L clavicle fx.   Received referral to arrange HHPT/OT. Met with pt. She lives with her son in [place] and her sister in [place]. She is planning to stay with her sister Vaughan Basta). Linda's address: [address], [place], [place], [address]. Pt's cell phone [phone].   Pt reports that someone ordered the DME (3-in-1-BSC and tub bench). DME is in pt's room. She reports that her son is going to buy the cane.  Discussed HHPT/OT and she is agreeable. She doesn't have a preference for a [place] agency. Unable to arranged [facility] services. Contacted the following [place] agencies:  1. [facility] - Spoke with Corene Cornea, he reports that they don't the staff for [place], [place].  [place] with [facility], she reports that she is unable to accept commercial insurance this week because this is their charity week.  3. [facility] Left VM for Cheryl.  4. [facility] - Spoke with Judson Roch, she reports that they are OON with pt's insurance.  5. [facility] - Left VM for Amy.  [place] message to Georgina Snell, he reports that they can't staff [place] at the moment.  Met with pt and informed her that I was not able to arranged [facility] services. Discussed outpt rehab. She reports that she has the papers on how to do the exercises and she feels comfortable that she can do them. Encouraged pt to notify the surgeon or PCP if she needs outpt rehab.

## 2020-03-11 NOTE — Plan of Care (Signed)
  Problem: Health Behavior/Discharge Planning: Goal: Ability to manage health-related needs will improve Outcome: Adequate for Discharge   Problem: Activity: Goal: Risk for activity intolerance will decrease Outcome: Adequate for Discharge   Problem: Pain Managment: Goal: General experience of comfort will improve Outcome: Adequate for Discharge

## 2020-03-11 NOTE — Progress Notes (Signed)
Pt given discharge instructions and gone over with her, she verbalized understanding. 3n1 and tub bench delivered to room and taken to car by pt's son. All belongings gathered to be sent home. Pt in no distress at discharge. Pt's son to drive her home.

## 2020-03-11 NOTE — Progress Notes (Signed)
Received call from El [place] at [facility] and she accepted the referral. Met with pt and son and she agreed with the [facility] agency.

## 2020-03-14 ENCOUNTER — Ambulatory Visit: Payer: 59 | Admitting: Vascular Surgery

## 2020-03-14 ENCOUNTER — Other Ambulatory Visit: Payer: Self-pay | Admitting: *Deleted

## 2020-03-14 ENCOUNTER — Encounter: Payer: Self-pay | Admitting: *Deleted

## 2020-03-15 ENCOUNTER — Telehealth: Payer: Self-pay | Admitting: Diagnostic Neuroimaging

## 2020-03-15 ENCOUNTER — Ambulatory Visit: Payer: 59 | Admitting: Diagnostic Neuroimaging

## 2020-03-15 ENCOUNTER — Encounter: Payer: Self-pay | Admitting: Diagnostic Neuroimaging

## 2020-03-15 VITALS — BP 153/90 | HR 95 | Ht 67.0 in | Wt 190.0 lb

## 2020-03-15 DIAGNOSIS — G459 Transient cerebral ischemic attack, unspecified: Secondary | ICD-10-CM

## 2020-03-15 DIAGNOSIS — G40209 Localization-related (focal) (partial) symptomatic epilepsy and epileptic syndromes with complex partial seizures, not intractable, without status epilepticus: Secondary | ICD-10-CM

## 2020-03-15 MED ORDER — LEVETIRACETAM 500 MG PO TABS: 500.0000 mg | ORAL_TABLET | Freq: Two times a day (BID) | ORAL | 4 refills | Status: DC

## 2020-03-15 NOTE — Patient Instructions (Signed)
TIA vs seizure  - check MRI brain, EEG  - continue plavix, insulin, atorvastatin, lisinopril  - smoking cessation reviewed  - start levetiracetam 500mg  twice a day   - According to [place] law, you can not drive unless you are seizure / syncope free for at least 6 months and under physician's care.   - Please maintain precautions. Do not participate in activities where a loss of awareness could harm you or someone else. No swimming alone, no tub bathing, no hot tubs, no driving, no operating motorized vehicles (cars, ATVs, motocycles, etc), lawnmowers, power tools or firearms. No standing at heights, such as rooftops, ladders or stairs. Avoid hot objects such as stoves, heaters, open fires. Wear a helmet when riding a bicycle, scooter, skateboard, etc. and avoid areas of traffic. Set your water heater to 120 degrees or less.

## 2020-03-15 NOTE — Progress Notes (Signed)
[facility]  PATIENT: Terri Li DOB: [birth year]  REFERRING CLINICIAN: O'Buch, Greta, PA-C HISTORY FROM: patient  REASON FOR VISIT: new consult    HISTORICAL  CHIEF COMPLAINT:  Chief Complaint  Patient presents with  . Seizures    rm 7 New Pt  "Oct 8th, probably had 6 seizures that day preceeded by huge headache, taken to [facility] ED; have had more episodes"  . Migraine    "taking Tylenol for headaches but if I don't they reoccur"    HISTORY OF PRESENT ILLNESS:   59 year old female here for evaluation of abnormal spells.  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  02/04/2020 patient had episode at home where her head and eyes were deviated to the right side, right arm raised up into the air with abnormal movements, lasted for few minutes.  Patient had some fatigue and confusion afterwards.  She was having trouble talking.  She had 5-7 episodes at home on that day.  Her sister came home and witnessed 1 of these events and called 911.  Patient was taken the hospital.  By that time symptoms had resolved and patient was mainly complaining of headaches.  She had a CT scan of the head and headache evaluation.  She was discharged home.  Patient has ongoing similar episodes of right gaze and head deviation, right arm movements, confusion and speech difficulty on October 9, 10th, 12th, 14th.  Following this no further events.  Patient felt tired and confused afterwards.  Patient continues to have intermittent headaches.  On 03/07/2020 patient slipped and fell down some steps resulting in multiple C-spine, T-spine, L-spine, rib and left clavicle fractures.  She denies any seizure-like or strokelike activity at the onset of the fall and states that this was an accidental slip.  No prior history of seizures.   REVIEW OF SYSTEMS: Full 14  system review of systems performed and negative with exception of: As per HPI.  ALLERGIES: Allergies  Allergen Reactions  . Hydrocodone Itching  . Codeine Itching    HOME MEDICATIONS: Outpatient Medications Prior to Visit  Medication Sig Dispense Refill  . acetaminophen (TYLENOL) 500 MG tablet Take 500 mg by mouth every 6 (six) hours as needed.    Marland Kitchen atorvastatin (LIPITOR) 80 MG tablet Take 80 mg by mouth daily.     . clopidogrel (PLAVIX) 75 MG tablet Take 75 mg by mouth daily.    . empagliflozin (JARDIANCE) 25 MG TABS tablet Take 25 mg by mouth daily.    . Gabapentin, Once-Daily, (GRALISE) 600 MG TABS Take 1,800 mg by mouth daily.     Marland Kitchen HUMALOG KWIKPEN 100 UNIT/ML KwikPen Inject 15-50 Units into the skin 3 (three) times daily. Inject 15 units BID then Inject 50 units at bedtime    . levothyroxine (SYNTHROID) 150 MCG tablet Take 150 mcg by mouth daily.    Marland Kitchen lisinopril (ZESTRIL) 10 MG tablet Take 10 mg by mouth daily.     . metFORMIN (GLUCOPHAGE) 500 MG tablet Take 1,000-1,500 mg by mouth 2 (two) times daily with a meal. Take 2 tablet (1000 mg) and Take 3 tablets (1500 mg) at bedtime    . Tapentadol HCl (NUCYNTA) 100 MG TABS Take 100 mg by mouth 2 (two) times daily.    Nelva Nay SOLOSTAR 300 UNIT/ML Solostar Pen Inject 60 Units into the skin at bedtime.     . traMADol (ULTRAM) 50 MG  tablet Take 1-2 tablets (50-100 mg total) by mouth every 6 (six) hours as needed (79m for moderate pain, 1057mfor severe pain). 30 tablet 0  . zolpidem (AMBIEN) 10 MG tablet Take 10 mg by mouth at bedtime as needed for sleep.    . celecoxib (CELEBREX) 100 MG capsule Take 100 mg by mouth 2 (two) times daily. (Patient not taking: Reported on 03/15/2020)    . nicotine (NICODERM CQ - DOSED IN MG/24 HOURS) 14 mg/24hr patch Place 1 patch (14 mg total) onto the skin daily. (Patient not taking: Reported on 03/15/2020) 28 patch 0   No facility-administered medications prior to visit.    PAST MEDICAL HISTORY: Past  Medical History:  Diagnosis Date  . Carotid artery stenosis   . COPD (chronic obstructive pulmonary disease) ([facility]  . Diabetes mellitus without complication (HCOnycha   type 2  . Hyperlipidemia   . Hypertension   . Hypothyroidism   . Insomnia   . Migraine   . Seizures (HCElba10/11/2019  . Stroke ([facility]   H/O  . Thyroid disease    cancer    PAST SURGICAL HISTORY: Past Surgical History:  Procedure Laterality Date  . APPENDECTOMY  1979  . BACK SURGERY  2010   several  . CAROTID ENDARTERECTOMY  09/2019  . CHOLECYSTECTOMY  2007  . OOPHORECTOMY Right 2008   one  . SPINAL CORD STIMULATOR IMPLANT  2013, 2017  . TOTAL THYROIDECTOMY  1992    FAMILY HISTORY: Family History  Problem Relation Age of Onset  . Heart attack Mother   . Arthritis Mother   . Arthritis Father   . Hypertension Sister   . Hypertension Brother   . Stroke Maternal Grandfather     SOCIAL HISTORY: Social History   Socioeconomic History  . Marital status: Divorced    Spouse name: Not on file  . Number of children: 1  . Years of education: Not on file  . Highest education level: Associate degree: academic program  Occupational History    Comment: [employer]  Tobacco Use  . Smoking status: Current Every Day Smoker    Packs/day: 1.00  . Smokeless tobacco: Never Used  Substance and Sexual Activity  . Alcohol use: Not Currently  . Drug use: Not Currently    Comment: used 40 yrs ago  . Sexual activity: Not on file  Other Topics Concern  . Not on file  Social History Narrative   Lives with son   Caffeine- coffee 4 c daily or more   Social Determinants of Health   Financial Resource Strain:   . Difficulty of Paying Living Expenses: Not on file  Food Insecurity:   . Worried About RuCharity fundraisern the Last Year: Not on file  . Ran Out of Food in the Last Year: Not on file  Transportation Needs:   . Lack of Transportation (Medical): Not on file  . Lack of Transportation (Non-Medical): Not on file   Physical Activity:   . Days of Exercise per Week: Not on file  . Minutes of Exercise per Session: Not on file  Stress:   . Feeling of Stress : Not on file  Social Connections:   . Frequency of Communication with Friends and Family: Not on file  . Frequency of Social Gatherings with Friends and Family: Not on file  . Attends Religious Services: Not on file  . Active Member of Clubs or Organizations: Not on file  . Attends ClArchivist  Meetings: Not on file  . Marital Status: Not on file  Intimate Partner Violence:   . Fear of Current or Ex-Partner: Not on file  . Emotionally Abused: Not on file  . Physically Abused: Not on file  . Sexually Abused: Not on file     PHYSICAL EXAM  GENERAL EXAM/CONSTITUTIONAL: Vitals:  Vitals:   03/15/20 0947  BP: (!) 153/90  Pulse: 95  Weight: 190 lb (86.2 kg)  Height: 5' 7"  (1.702 m)     Body mass index is 29.76 kg/m. Wt Readings from Last 3 Encounters:  03/15/20 190 lb (86.2 kg)  03/08/20 195 lb 1.7 oz (88.5 kg)  02/29/20 188 lb 3.2 oz (85.4 kg)     Patient is in no distress; well developed, nourished and groomed; neck is supple  LEFT FOREHEAD, PERIORBITAL SWELLING  CARDIOVASCULAR:  Examination of carotid arteries is normal; no carotid bruits  Regular rate and rhythm, no murmurs  Examination of peripheral vascular system by observation and palpation is normal  EYES:  Ophthalmoscopic exam of optic discs and posterior segments is normal; no papilledema or hemorrhages  No exam data present  MUSCULOSKELETAL:  Gait, strength, tone, movements noted in Neurologic exam below  NEUROLOGIC: MENTAL STATUS:  No flowsheet data found.  awake, alert, oriented to person, place and time  recent and remote memory intact  normal attention and concentration  language fluent, comprehension intact, naming intact  fund of knowledge appropriate  CRANIAL NERVE:   2nd - no papilledema on fundoscopic exam  2nd, 3rd, 4th,  6th - pupils equal and reactive to light, visual fields full to confrontation, extraocular muscles intact, no nystagmus  5th - facial sensation symmetric  7th - facial strength --> SLIGHTLY DECR RIGHT NL FOLD  8th - hearing intact  9th - palate elevates symmetrically, uvula midline  11th - shoulder shrug symmetric  12th - tongue protrusion midline  MOTOR:   normal bulk and tone, full strength in the BUE, BLE; EXCEPT LIMITED IN LEFT ARM DUE TO CLAVICLE FRACTURE AND SLING; SLOW FINGER TAP ON RIGHT  SENSORY:   normal and symmetric to light touch, temperature, vibration; EXCEPT DECR IN RIGHT ARM AND RIGHT LEG  COORDINATION:   finger-nose-finger, fine finger movements SLOW ON RIGHT  REFLEXES:   deep tendon reflexes TRACE and symmetric  GAIT/STATION:   narrow based gait     DIAGNOSTIC DATA (LABS, IMAGING, TESTING) - I reviewed patient records, labs, notes, testing and imaging myself where available.  Lab Results  Component Value Date   WBC 5.8 03/09/2020   HGB 10.4 (L) 03/09/2020   HCT 32.6 (L) 03/09/2020   MCV 85.6 03/09/2020   PLT 268 03/09/2020      Component Value Date/Time   NA 134 (L) 03/09/2020 1036   K 3.9 03/09/2020 1036   CL 96 (L) 03/09/2020 1036   CO2 27 03/09/2020 1036   GLUCOSE 104 (H) 03/09/2020 1036   BUN 9 03/09/2020 1036   CREATININE 0.83 03/09/2020 1036   CALCIUM 8.9 03/09/2020 1036   PROT 7.8 03/07/2020 2041   ALBUMIN 4.5 03/07/2020 2041   AST 31 03/07/2020 2041   ALT 25 03/07/2020 2041   ALKPHOS 45 03/07/2020 2041   BILITOT 0.5 03/07/2020 2041   GFRNONAA >60 03/09/2020 1036   GFRAA  10/18/2009 0350    >60        The eGFR has been calculated using the MDRD equation. This calculation has not been validated in all clinical situations. eGFR's persistently <60 mL/min  signify possible Chronic Kidney Disease.   No results found for: CHOL, HDL, LDLCALC, LDLDIRECT, TRIG, CHOLHDL Lab Results  Component Value Date   HGBA1C 6.4 (H)  03/08/2020   No results found for: VITAMINB12 No results found for: TSH   11/25/19 CTA head / neck [I reviewed images myself and agree with interpretation. -VRP]  - No acute intracranial abnormality. 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 - Plaque at the proximal right ICA causes less than 50% stenosis.  - Plaque slightly distal to the left ICA origin causes 50% stenosis. - Occluded extracranial right vertebral artery with collateral reconstitution near the skull base. Severe stenosis at the left vertebral origin.    ASSESSMENT AND PLAN  59 y.o. year old female here with:  Dx:  1. Partial symptomatic epilepsy with complex partial seizures, not intractable, without status epilepticus ([place])   2. TIA (transient ischemic attack)     PLAN:  Intermittent episodes of right gaze and head deviation, right arm movements, postictal confusion, suspicious for left frontal lobe seizure; left frontal lobe TIA/stroke may have occurred at the onset.  (Oct 8, 9, 10, 12, 14 --> 2021)  - MRI brain, EEG  - continue plavix, insulin, atorvastatin, lisinopril  - smoking cessation reviewed  - start levetiracetam 585m twice a day   - According to [place] law, you can not drive unless you are seizure / syncope free for at least 6 months and under physician's care.   - Please maintain precautions. Do not participate in activities where a loss of awareness could harm you or someone else. No swimming alone, no tub bathing, no hot tubs, no driving, no operating motorized vehicles (cars, ATVs, motocycles, etc), lawnmowers, power tools or firearms. No standing at heights, such as rooftops, ladders or stairs. Avoid hot objects such as stoves, heaters, open fires. Wear a helmet when riding a bicycle, scooter, skateboard, etc. and avoid areas of traffic.  Set your water heater to 120 degrees or less.   Orders Placed This Encounter  Procedures  . MR BRAIN W WO CONTRAST  . EEG adult    Meds ordered this encounter  Medications  . levETIRAcetam (KEPPRA) 500 MG tablet    Sig: Take 1 tablet (500 mg total) by mouth 2 (two) times daily.    Dispense:  180 tablet    Refill:  4    Return in about 4 months (around 07/13/2020).  I reviewed images, labs, notes, records myself. I summarized findings and reviewed with patient, for this high risk condition (seizure / stroke) requiring high complexity decision making.    VPenni Bombard MD 186/76/1950 193:26AM Certified in Neurology, Neurophysiology and Neuroimaging  [facility] [address] [place] [phone] ,

## 2020-03-15 NOTE — Telephone Encounter (Signed)
[employer] auth: [place] via [employer] website pacemaker info faxed to [facility]. If it is safe they will reach out to the patient to schedule.

## 2020-03-21 NOTE — Telephone Encounter (Signed)
[facility] reached out to me and wanted to know the length of the leads. I reached out to [facility] sc[facility] specialists and left a voicemail for Colletta [place] to return my call for this information.

## 2020-04-03 ENCOUNTER — Telehealth: Payer: Self-pay | Admitting: *Deleted

## 2020-04-03 NOTE — Telephone Encounter (Signed)
Received surgical clearance from [facility] re: left total shoulder replacement. Called patient for clarification of  EEG, MRI that Dr Leta Baptist ordered. She has not heard about MRI since call asking about the length of the pacemaker leads, and hasn't heard about scheduling EEG. She is taking plavix which was prescribed by Vascular surgeon in [place], and taking Keppra. I advised will let Dr Leta Baptist know of above, will route to [facility] MRI coordinator to followup on her call re: length of pacemaker leads. Advised patient she'll get a call back re: EEG, MRI. Patient verbalized understanding, appreciation.

## 2020-04-04 NOTE — Telephone Encounter (Addendum)
Surgical clearance form completed, signed by MD and faxed to [facility]. Called patient to advise her and scheduled EEG. She will contact her pain MD to ask if he has any information on her pacemaker, stated "he was the one that got all that started", and she  will let us know when she comes for EEG Thurs. Patient verbalized understanding, appreciation.

## 2020-04-06 ENCOUNTER — Other Ambulatory Visit: Payer: Self-pay

## 2020-04-06 ENCOUNTER — Other Ambulatory Visit: Payer: Self-pay | Admitting: Neurosurgery

## 2020-04-06 ENCOUNTER — Ambulatory Visit: Payer: 59 | Admitting: Diagnostic Neuroimaging

## 2020-04-06 DIAGNOSIS — G40209 Localization-related (focal) (partial) symptomatic epilepsy and epileptic syndromes with complex partial seizures, not intractable, without status epilepticus: Secondary | ICD-10-CM | POA: Diagnosis not present

## 2020-04-12 ENCOUNTER — Other Ambulatory Visit: Payer: Self-pay | Admitting: Neurosurgery

## 2020-04-12 ENCOUNTER — Telehealth: Payer: Self-pay

## 2020-04-12 DIAGNOSIS — G8929 Other chronic pain: Secondary | ICD-10-CM | POA: Insufficient documentation

## 2020-04-12 DIAGNOSIS — E785 Hyperlipidemia, unspecified: Secondary | ICD-10-CM | POA: Insufficient documentation

## 2020-04-12 DIAGNOSIS — S1190XA Unspecified open wound of unspecified part of neck, initial encounter: Secondary | ICD-10-CM

## 2020-04-12 NOTE — Telephone Encounter (Signed)
Spoke with patient for [place] to screen her medications before getting her scheduled for a cervical myelogram.  She stated an understanding that she will be here two hours, will need a driver and will need to be on strict bedrest (explained) for 24 hours after there procedure.  She also stated an understanding that she needs to hold Tramadol and Tapentadol for 48 hours before, and 24 hours after, the myelogram.

## 2020-04-12 NOTE — Procedures (Signed)
   [facility]  EEG (ELECTROENCEPHALOGRAM) REPORT   STUDY DATE: 04/06/20 PATIENT NAME: Terri Li DOB: [birth year] MRN: [id number]  ORDERING CLINICIAN: Andrey Spearman, MD m  TECHNOLOGIST: Babs Bertin  TECHNIQUE: Electroencephalogram was recorded utilizing standard 10-20 system of lead placement and reformatted into average and bipolar montages.  RECORDING TIME: 22 minutes  ACTIVATION: hyperventilation and photic stimulation  CLINICAL INFORMATION: 59 year old female with seizures.  FINDINGS: Posterior dominant background rhythms, which attenuate with eye opening, ranging 9-10 hertz and 30-40 microvolts. No focal, lateralizing, epileptiform activity or seizures are seen. Patient recorded in the awake and drowsy state. EKG channel shows regular rhythm of 70-80 beats per minute.   IMPRESSION:   Normal EEG in the awake and drowsy states.    INTERPRETING PHYSICIAN:  Penni Bombard, MD Certified in Neurology, Neurophysiology and Neuroimaging  [facility] [address], [place], [place] [address] [phone]

## 2020-04-13 ENCOUNTER — Telehealth: Payer: Self-pay | Admitting: *Deleted

## 2020-04-13 NOTE — Telephone Encounter (Signed)
LVM informing patient EEG was normal. Reminded her of [place] DMV law and to continue to take levetiracetam. Left # for questions.

## 2020-04-14 ENCOUNTER — Other Ambulatory Visit: Payer: Self-pay | Admitting: Neurosurgery

## 2020-04-14 DIAGNOSIS — M542 Cervicalgia: Secondary | ICD-10-CM

## 2020-04-14 DIAGNOSIS — G8929 Other chronic pain: Secondary | ICD-10-CM

## 2020-04-14 DIAGNOSIS — M545 Low back pain, unspecified: Secondary | ICD-10-CM

## 2020-04-14 DIAGNOSIS — M546 Pain in thoracic spine: Secondary | ICD-10-CM

## 2020-04-19 ENCOUNTER — Ambulatory Visit
Admission: RE | Admit: 2020-04-19 | Discharge: 2020-04-19 | Disposition: A | Discharge status: Home / Self Care | Payer: 59 | Source: Ambulatory Visit | Attending: Neurosurgery | Admitting: Neurosurgery

## 2020-04-19 DIAGNOSIS — M546 Pain in thoracic spine: Secondary | ICD-10-CM

## 2020-04-19 DIAGNOSIS — G8929 Other chronic pain: Secondary | ICD-10-CM

## 2020-04-19 DIAGNOSIS — M545 Low back pain, unspecified: Secondary | ICD-10-CM

## 2020-04-19 DIAGNOSIS — M542 Cervicalgia: Secondary | ICD-10-CM

## 2020-04-19 DIAGNOSIS — S1190XA Unspecified open wound of unspecified part of neck, initial encounter: Secondary | ICD-10-CM

## 2020-04-19 MED ORDER — DIAZEPAM 5 MG PO TABS
10.0000 mg | ORAL_TABLET | Freq: Once | ORAL | Status: AC
  Administered 2020-04-19: 10 mg via ORAL

## 2020-04-19 MED ORDER — IOPAMIDOL (ISOVUE-M 300) INJECTION 61%
10.0000 mL | Freq: Once | INTRAMUSCULAR | Status: AC
  Administered 2020-04-19: 10 mL via INTRATHECAL

## 2020-04-19 NOTE — Discharge Instructions (Signed)
Myelogram Discharge Instructions  1. Go home and rest quietly for the next 24 hours.  It is important to lie flat for the next 24 hours.  Get up only to go to the restroom.  You may lie in the bed or on a couch on your back, your stomach, your left side or your right side.  You may have one pillow under your head.  You may have pillows between your knees while you are on your side or under your knees while you are on your back.  2. DO NOT drive today.  Recline the seat as far back as it will go, while still wearing your seat belt, on the way home.  3. You may get up to go to the bathroom as needed.  You may sit up for 10 minutes to eat.  You may resume your normal diet and medications unless otherwise indicated.  Drink lots of extra fluids today and tomorrow.  4. The incidence of headache, nausea, or vomiting is about 5% (one in 20 patients).  If you develop a headache, lie flat and drink plenty of fluids until the headache goes away.  Caffeinated beverages may be helpful.  If you develop severe nausea and vomiting or a headache that does not go away with flat bed rest, call [phone].  5. You may resume normal activities after your 24 hours of bed rest is over; however, do not exert yourself strongly or do any heavy lifting tomorrow. If when you get up you have a headache when standing, go back to bed and force fluids for another 24 hours.  6. Call your physician for a follow-up appointment.  The results of your myelogram will be sent directly to your physician by the following day.  7. If you have any questions or if complications develop after you arrive home, please call [phone].  Discharge instructions have been explained to the patient.  The patient, or the person responsible for the patient, fully understands these instructions  YOU MAY TAKE YOUR TRAMADOL AND NUCYNTA/TAPENTADO TOMORROW ON 04/20/20 AT 9:30AM.

## 2020-04-19 NOTE — Progress Notes (Signed)
Pt reports she has been off of Tramadol and Nucynta for 48 hours and verbalizes understanding not to restart this medication until tomorrow 04/20/20.

## 2020-05-02 NOTE — Patient Instructions (Addendum)
DUE TO COVID-19 ONLY ONE VISITOR IS ALLOWED TO COME WITH YOU AND STAY IN THE WAITING ROOM ONLY DURING PRE OP AND PROCEDURE DAY OF SURGERY. THE 1 VISITOR  MAY VISIT WITH YOU AFTER SURGERY IN YOUR PRIVATE ROOM DURING VISITING HOURS ONLY!  YOU NEED TO HAVE A COVID 19 TEST ON_1/8______ @_10 :00_____, THIS TEST MUST BE DONE BEFORE SURGERY,  COVID TESTING SITE [facility] [address], IT IS ON THE RIGHT GOING OUT [address] APPROXIMATELY  2 MINUTES PAST [employer] ON THE RIGHT. ONCE YOUR COVID TEST IS COMPLETED,  PLEASE BEGIN THE QUARANTINE INSTRUCTIONS AS OUTLINED IN YOUR HANDOUT.                AELLA SHACKELFORD   Your procedure is scheduled on: 05/10/20   Report to [facility] Main  Entrance   Report to admitting at 12:15 PM     Call this number if you have problems the morning of surgery [place], NO CHEWING GUM [place].  No food after midnight.    You may have clear liquid until 11:30 AM.     CLEAR LIQUID DIET   Foods Allowed                                                                     Foods Excluded  Coffee and tea, regular and decaf                             liquids that you cannot  Plain Jell-O any favor except red or purple                                           see through such as: Fruit ices (not with fruit pulp)                                     milk, soups, orange juice  Iced Popsicles                                    All solid food Carbonated beverages, regular and diet                                    Cranberry, grape and apple juices Sports drinks like Gatorade Lightly seasoned clear broth or consume(fat free) Sugar, honey syrup      At 11:00 AM drink pre surgery drink.   Nothing by mouth after 11:30 AM.    Take these medicines the morning of surgery with A SIP OF WATER: Keppra, Levothyroxine. Bring your stimulator and  inhaler                       How to Manage Your Diabetes Before and  After Surgery  Why is it important to control my blood sugar before and after surgery? . Improving blood sugar levels before and after surgery helps healing and can limit problems. . A way of improving blood sugar control is eating a healthy diet by: o  Eating less sugar and carbohydrates o  Increasing activity/exercise o  Talking with your doctor about reaching your blood sugar goals . High blood sugars (greater than 180 mg/dL) can raise your risk of infections and slow your recovery, so you will need to focus on controlling your diabetes during the weeks before surgery. . Make sure that the doctor who takes care of your diabetes knows about your planned surgery including the date and location.  How do I manage my blood sugar before surgery? . Check your blood sugar at least 4 times a day, starting 2 days before surgery, to make sure that the level is not too high or low. o Check your blood sugar the morning of your surgery when you wake up and every 2 hours until you get to the Short Stay unit. . If your blood sugar is less than 70 mg/dL, you will need to treat for low blood sugar: o Do not take insulin. o Treat a low blood sugar (less than 70 mg/dL) with  cup of clear juice (cranberry or apple), 4 glucose tablets, OR glucose gel. o Recheck blood sugar in 15 minutes after treatment (to make sure it is greater than 70 mg/dL). If your blood sugar is not greater than 70 mg/dL on recheck, call [phone] for further instructions. . Report your blood sugar to the short stay nurse when you get to Short Stay.  . If you are admitted to the hospital after surgery: o Your blood sugar will be checked by the staff and you will probably be given insulin after surgery (instead of oral diabetes medicines) to make sure you have good blood sugar levels. o The goal for blood sugar control after surgery is 80-180 mg/dL.   WHAT DO I  DO ABOUT MY DIABETES MEDICATION?  Marland Kitchen Do not take oral diabetes medicines (pills) the morning of surgery.  . THE NIGHT BEFORE SURGERY, take  30  units of  Toujeo  insulin.       . THE MORNING OF SURGERY, take 7  units of   Humalog    Insulin. If CBG is 220 mg/dL  . The day of surgery, do not take other diabetes injectables, including Byetta (exenatide), Bydureon (exenatide ER), Victoza (liraglutide), or Trulicity (dulaglutide).                   You may not have any metal on your body including hair pins and              piercings  Do not wear jewelry, make-up, lotions, powders or perfumes, deodorant             Do not wear nail polish on your fingernails.  Do not shave  48 hours prior to surgery.                 Do not bring valuables to the hospital. [facility] IS NOT             RESPONSIBLE   FOR VALUABLES.  Contacts, dentures or bridgework may not be worn into surgery.       Patients discharged the day of surgery will not be allowed to drive home  . IF YOU ARE HAVING  SURGERY AND GOING HOME THE SAME DAY, YOU MUST HAVE AN ADULT TO DRIVE YOU HOME AND BE WITH YOU FOR 24 HOURS.   YOU MAY GO HOME BY TAXI OR UBER OR ORTHERWISE, BUT AN ADULT MUST ACCOMPANY YOU HOME AND STAY WITH YOU FOR 24 HOURS.  Name and phone number of your driver:  Special Instructions: N/A              Please read over the following fact sheets you were given: _____________________________________________________________________             [facility]- Preparing for Total Shoulder Arthroplasty    Before surgery, you can play an important role. Because skin is not sterile, your skin needs to be as free of germs as possible. You can reduce the number of germs on your skin by using the following products. . Benzoyl Peroxide Gel o Reduces the number of germs present on the skin o Applied twice a day to shoulder area starting two days before surgery     ==================================================================  Please follow these instructions carefully:  BENZOYL PEROXIDE 5% GEL  Please do not use if you have an allergy to benzoyl peroxide.   If your skin becomes reddened/irritated stop using the benzoyl peroxide.  Starting two days before surgery, apply as follows: 1. Apply benzoyl peroxide in the morning and at night. Apply after taking a shower. If you are not taking a shower clean entire shoulder front, back, and side along with the armpit with a clean wet washcloth.  2. Place a quarter-sized dollop on your shoulder and rub in thoroughly, making sure to cover the front, back, and side of your shoulder, along with the armpit.   2 days before ____ AM   ____ PM              1 day before ____ AM   ____ PM                         3. Do this twice a day for two days.  (Last application is the night before surgery, AFTER using the CHG soap as described below).  4. Do NOT apply benzoyl peroxide gel on the day of surgery.   [facility] - Preparing for Surgery Before surgery, you can play an important role .  Because skin is not sterile, your skin needs to be as free of germs as possible.   You can reduce the number of germs on your skin by washing with CHG (chlorahexidine gluconate) soap before surgery.   CHG is an antiseptic cleaner which kills germs and bonds with the skin to continue killing germs even after washing. Please DO NOT use if you have an allergy to CHG or antibacterial soaps.   If your skin becomes reddened/irritated stop using the CHG and inform your nurse when you arrive at Short Stay. Do not shave (including legs and underarms) for at least 48 hours prior to the first CHG shower.   Please follow these instructions carefully:   1.  Shower with CHG Soap the night before surgery and the  morning of Surgery.  2.  If you choose to wash your hair, wash your hair first as usual with your  normal  shampoo.  3.   After you shampoo, rinse your hair and body thoroughly to remove the  shampoo.  4.  Use CHG as you would any other liquid soap.  You can apply chg directly  to the skin and wash                       Gently with a scrungie or clean washcloth.  5.  Apply the CHG Soap to your body ONLY FROM THE NECK DOWN.   Do not use on face/ open                           Wound or open sores. Avoid contact with eyes, ears mouth and genitals (private parts).                       Wash face,  Genitals (private parts) with your normal soap.             6.  Wash thoroughly, paying special attention to the area where your surgery  will be performed.  7.  Thoroughly rinse your body with warm water from the neck down.  8.  DO NOT shower/wash with your normal soap after using and rinsing off  the CHG Soap.             9.  Pat yourself dry with a clean towel.            10.  Wear clean pajamas.            11.  Place clean sheets on your bed the night of your first shower and do not  sleep with pets. Day of Surgery : Do not apply any lotions/deodorants the morning of surgery.  Please wear clean clothes to the hospital/surgery center.  FAILURE TO FOLLOW THESE INSTRUCTIONS MAY RESULT IN THE CANCELLATION OF YOUR SURGERY PATIENT SIGNATURE_________________________________  NURSE SIGNATURE__________________________________  ________________________________________________________________________   Adam Phenix  An incentive spirometer is a tool that can help keep your lungs clear and active. This tool measures how well you are filling your lungs with each breath. Taking long deep breaths may help reverse or decrease the chance of developing breathing (pulmonary) problems (especially infection) following:  A long period of time when you are unable to move or be active. BEFORE THE PROCEDURE   If the spirometer includes an indicator to show your best effort, your nurse or  respiratory therapist will set it to a desired goal.  If possible, sit up straight or lean slightly forward. Try not to slouch.  Hold the incentive spirometer in an upright position. INSTRUCTIONS FOR USE  1. Sit on the edge of your bed if possible, or sit up as far as you can in bed or on a chair. 2. Hold the incentive spirometer in an upright position. 3. Breathe out normally. 4. Place the mouthpiece in your mouth and seal your lips tightly around it. 5. Breathe in slowly and as deeply as possible, raising the piston or the ball toward the top of the column. 6. Hold your breath for 3-5 seconds or for as long as possible. Allow the piston or ball to fall to the bottom of the column. 7. Remove the mouthpiece from your mouth and breathe out normally. 8. Rest for a few seconds and repeat Steps 1 through 7 at least 10 times every 1-2 hours when you are awake. Take your time and take a few normal breaths between deep breaths. 9. The spirometer may include an indicator to show your best effort.  Use the indicator as a goal to work toward during each repetition. 10. After each set of 10 deep breaths, practice coughing to be sure your lungs are clear. If you have an incision (the cut made at the time of surgery), support your incision when coughing by placing a pillow or rolled up towels firmly against it. Once you are able to get out of bed, walk around indoors and cough well. You may stop using the incentive spirometer when instructed by your caregiver.  RISKS AND COMPLICATIONS  Take your time so you do not get dizzy or light-headed.  If you are in pain, you may need to take or ask for pain medication before doing incentive spirometry. It is harder to take a deep breath if you are having pain. AFTER USE  Rest and breathe slowly and easily.  It can be helpful to keep track of a log of your progress. Your caregiver can provide you with a simple table to help with this. If you are using the  spirometer at home, follow these instructions: [place] IF:   You are having difficultly using the spirometer.  You have trouble using the spirometer as often as instructed.  Your pain medication is not giving enough relief while using the spirometer.  You develop fever of 100.5 F (38.1 C) or higher. SEEK IMMEDIATE MEDICAL CARE IF:   You cough up bloody sputum that had not been present before.  You develop fever of 102 F (38.9 C) or greater.  You develop worsening pain at or near the incision site. MAKE SURE YOU:   Understand these instructions.  Will watch your condition.  Will get help right away if you are not doing well or get worse. Document Released: 08/26/2006 Document Revised: 07/08/2011 Document Reviewed: 10/27/2006 [facility] Patient Information 2014 [place], [place].   ________________________________________________________________________

## 2020-05-03 ENCOUNTER — Encounter (HOSPITAL_COMMUNITY): Payer: Self-pay

## 2020-05-03 ENCOUNTER — Other Ambulatory Visit: Payer: Self-pay

## 2020-05-03 ENCOUNTER — Encounter (HOSPITAL_COMMUNITY)
Admission: RE | Admit: 2020-05-03 | Discharge: 2020-05-03 | Disposition: A | Discharge status: Home / Self Care | Payer: BLUE CROSS/BLUE SHIELD | Source: Ambulatory Visit | Attending: Orthopaedic Surgery | Admitting: Orthopaedic Surgery

## 2020-05-03 DIAGNOSIS — Z7984 Long term (current) use of oral hypoglycemic drugs: Secondary | ICD-10-CM | POA: Diagnosis not present

## 2020-05-03 DIAGNOSIS — Z794 Long term (current) use of insulin: Secondary | ICD-10-CM | POA: Diagnosis not present

## 2020-05-03 DIAGNOSIS — I10 Essential (primary) hypertension: Secondary | ICD-10-CM | POA: Diagnosis not present

## 2020-05-03 DIAGNOSIS — Z01812 Encounter for preprocedural laboratory examination: Secondary | ICD-10-CM | POA: Insufficient documentation

## 2020-05-03 DIAGNOSIS — Z7902 Long term (current) use of antithrombotics/antiplatelets: Secondary | ICD-10-CM | POA: Diagnosis not present

## 2020-05-03 DIAGNOSIS — Z79890 Hormone replacement therapy (postmenopausal): Secondary | ICD-10-CM | POA: Diagnosis not present

## 2020-05-03 DIAGNOSIS — M19012 Primary osteoarthritis, left shoulder: Secondary | ICD-10-CM | POA: Diagnosis not present

## 2020-05-03 DIAGNOSIS — Z951 Presence of aortocoronary bypass graft: Secondary | ICD-10-CM | POA: Diagnosis not present

## 2020-05-03 DIAGNOSIS — Z79899 Other long term (current) drug therapy: Secondary | ICD-10-CM | POA: Insufficient documentation

## 2020-05-03 DIAGNOSIS — E119 Type 2 diabetes mellitus without complications: Secondary | ICD-10-CM | POA: Insufficient documentation

## 2020-05-03 DIAGNOSIS — Z8673 Personal history of transient ischemic attack (TIA), and cerebral infarction without residual deficits: Secondary | ICD-10-CM | POA: Insufficient documentation

## 2020-05-03 DIAGNOSIS — Z79891 Long term (current) use of opiate analgesic: Secondary | ICD-10-CM | POA: Diagnosis not present

## 2020-05-03 DIAGNOSIS — J449 Chronic obstructive pulmonary disease, unspecified: Secondary | ICD-10-CM | POA: Insufficient documentation

## 2020-05-03 DIAGNOSIS — I771 Stricture of artery: Secondary | ICD-10-CM | POA: Diagnosis not present

## 2020-05-03 HISTORY — DX: Malignant (primary) neoplasm, unspecified: C80.1

## 2020-05-03 LAB — CBC
HCT: 41.9 % (ref 36.0–46.0)
Hemoglobin: 13 g/dL (ref 12.0–15.0)
MCH: 27.4 pg (ref 26.0–34.0)
MCHC: 31 g/dL (ref 30.0–36.0)
MCV: 88.2 fL (ref 80.0–100.0)
Platelets: 306 10*3/uL (ref 150–400)
RBC: 4.75 MIL/uL (ref 3.87–5.11)
RDW: 15.2 % (ref 11.5–15.5)
WBC: 8 10*3/uL (ref 4.0–10.5)
nRBC: 0 % (ref 0.0–0.2)

## 2020-05-03 LAB — BASIC METABOLIC PANEL
Anion gap: 12 (ref 5–15)
BUN: 16 mg/dL (ref 6–20)
CO2: 25 mmol/L (ref 22–32)
Calcium: 9.4 mg/dL (ref 8.9–10.3)
Chloride: 103 mmol/L (ref 98–111)
Creatinine, Ser: 1.03 mg/dL — ABNORMAL HIGH (ref 0.44–1.00)
GFR, Estimated: >60 mL/min (ref >60)
Glucose, Bld: 108 mg/dL — ABNORMAL HIGH (ref 70–99)
Potassium: 4.7 mmol/L (ref 3.5–5.1)
Sodium: 140 mmol/L (ref 135–145)

## 2020-05-03 LAB — GLUCOSE, CAPILLARY: Glucose-Capillary: 108 mg/dL — ABNORMAL HIGH (ref 70–99)

## 2020-05-03 LAB — SURGICAL PCR SCREEN
MRSA, PCR: NEGATIVE
Staphylococcus aureus: NEGATIVE

## 2020-05-03 NOTE — Progress Notes (Signed)
COVID Vaccine Completed:Yes Date COVID Vaccine completed:08/05/19 COVID vaccine manufacturer: Pfizer    PCP - Dr. Reece Agar. O'Buch Cardiologist - no Neurologist - Dr. Marjory Lies  Chest x-ray - no EKG - 02/04/20-Epic Stress Test - no ECHO - no Cardiac Cath - no Pacemaker/ICD device last checked:NA  Sleep Study - no CPAP -   Fasting Blood Sugar - 89-140 Checks Blood Sugar _QD____ times a day  Blood Thinner Instructions:ASA, Plavix/ Dr. Lorene Dy Aspirin Instructions:continue ASA. Stop Plavix 5 days prior to DOS Last Dose:05/04/20 Stop Nucynta 2 days prior to DOS. Last dose 05/07/20  Anesthesia review:   Patient denies shortness of breath, fever, cough and chest pain at PAT appointment yes  Patient verbalized understanding of instructions that were given to them at the PAT appointment. Patient was also instructed that they will need to review over the PAT instructions again at home before surgery. Yes. Pt has no SOB climbing 3-4 flights of stairs, doing housework or ADLs. She has a spinal stimulator for back pain Her last seizure was 02/10/20.

## 2020-05-06 ENCOUNTER — Other Ambulatory Visit (HOSPITAL_COMMUNITY)
Admission: RE | Admit: 2020-05-06 | Discharge: 2020-05-06 | Disposition: A | Discharge status: Home / Self Care | Payer: BLUE CROSS/BLUE SHIELD | Source: Ambulatory Visit | Attending: Orthopaedic Surgery | Admitting: Orthopaedic Surgery

## 2020-05-06 DIAGNOSIS — Z01812 Encounter for preprocedural laboratory examination: Secondary | ICD-10-CM | POA: Diagnosis not present

## 2020-05-06 DIAGNOSIS — Z20822 Contact with and (suspected) exposure to covid-19: Secondary | ICD-10-CM | POA: Diagnosis not present

## 2020-05-07 LAB — SARS CORONAVIRUS 2 (TAT 6-24 HRS): SARS Coronavirus 2: NEGATIVE

## 2020-05-08 NOTE — Progress Notes (Signed)
Anesthesia Chart Review   Case: [id number] Date/Time: 05/10/20 0815   Procedure: REVERSE SHOULDER ARTHROPLASTY AND LEFT SHOULDER OPEN DISTAL CLAVICLE EXCISION (Left Shoulder) - LEFT SHOULDER OPEN DISTAL CLAVICLE EXCISION   Anesthesia type: Choice   Pre-op diagnosis: djd left shoulder   Location: [facility] ROOM 06 / [facility]   Surgeons: Hiram Gash, MD      DISCUSSION:59 y.o. 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 Ophelia Charter.    Pt with spinal cord stimulator in place.   Clearance received from PCP, on chart.   Clearance received from neurology, on chart. No neurologic contraindication for planned surgery.   Clearance received from vascular surgery, on chart. Ok from vascular standpoint. Advised to hold plavix 5 days prior to surgery.    Anticipate pt can proceed with planned procedure barring acute status change.   VS: BP 111/72   Pulse 91   Temp 36.8 C (Oral)   Resp 18   Ht 5\' 6"  (1.676 m)   Wt 84.8 kg   SpO2 98%   BMI 30.18 kg/m   PROVIDERS: O'Buch, Greta, PA-C is PCP   Penumalli, Vikram, is Neurologist  LABS: Labs reviewed: Acceptable for surgery. (all labs ordered are listed, but only abnormal results are displayed)  Labs Reviewed  GLUCOSE, CAPILLARY - Abnormal; Notable for the following components:      Result Value   Glucose-Capillary 108 (*)    All other components within normal limits  BASIC METABOLIC PANEL - Abnormal; Notable for the following components:   Glucose, Bld 108 (*)    Creatinine, Ser 1.03 (*)    All other components within normal limits  SURGICAL PCR SCREEN  CBC     IMAGES:    EKG: 02/04/2020 Rate 89 bpm  Sinus rhythm Prolonged PR interval Consider right atrial enlargement Borderline left axis deviation Anterior infarct, old Unchanged from previous EKG  CV:  Past Medical  History:  Diagnosis Date  . Cancer ([place])    thyroid  . Carotid artery stenosis   . COPD (chronic obstructive pulmonary disease) ([place])   . Diabetes mellitus without complication ([place])    type 2  . Hyperlipidemia   . Hypertension   . Hypothyroidism   . Insomnia   . Migraine    with a seizue  . Seizures ([place]) 02/04/2020  . Stroke ([place]) 12/2019   H/O  . Thyroid disease    cancer    Past Surgical History:  Procedure Laterality Date  . APPENDECTOMY  1979  . BACK SURGERY  2010   several  . CAROTID ENDARTERECTOMY Left 09/2019  . CHOLECYSTECTOMY  2007  . OOPHORECTOMY Right 2008   one  . SPINAL CORD STIMULATOR IMPLANT  2013, 2017  . TOTAL THYROIDECTOMY  1992    MEDICATIONS: . acetaminophen (TYLENOL) 500 MG tablet  . atorvastatin (LIPITOR) 80 MG tablet  . clopidogrel (PLAVIX) 75 MG tablet  . empagliflozin (JARDIANCE) 25 MG TABS tablet  . fluticasone (FLONASE) 50 MCG/ACT nasal spray  . Gabapentin, Once-Daily, (GRALISE) 600 MG TABS  . HUMALOG KWIKPEN 100 UNIT/ML KwikPen  . levETIRAcetam (KEPPRA) 500 MG tablet  . levothyroxine (SYNTHROID) 175 MCG tablet  . lisinopril (ZESTRIL) 10 MG tablet  . metFORMIN (GLUCOPHAGE) 500 MG tablet  . nicotine (NICODERM CQ - DOSED IN MG/24 HOURS) 14 mg/24hr patch  . Tapentadol HCl (NUCYNTA) 100 MG  TABS  . TOUJEO SOLOSTAR 300 UNIT/ML Solostar Pen  . traMADol (ULTRAM) 50 MG tablet  . zolpidem (AMBIEN) 5 MG tablet   No current facility-administered medications for this encounter.    Konrad Felix, PA-C [facility] Pre-Surgical Testing [phone]

## 2020-05-09 NOTE — H&P (Signed)
PREOPERATIVE H&P  Chief Complaint: djd left shoulder  HPI: Terri Li is a 60 y.o. female who is scheduled for Procedure(s): REVERSE SHOULDER ARTHROPLASTY AND LEFT SHOULDER OPEN DISTAL CLAVICLE EXCISION.   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   Patient had an initial injury to her left shoulder in August 2021 when she was walking and tripped on a tree root and landed directly onto her left shoulder. She had immediate pain and presented to [facility]. There, X-rays reportedly did not show any fractures. It was recommended she follow-up with [place] for further evaluation. Since then, she has had continued pain over the lateral aspect of her left shoulder. She is not able to raise her arm above her head or reach behind her back. 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 CT arthrogram demonstrates a retracted cuff tear that is massive in nature involving three tendons and is at least to the level of the glenoid. She unfortunately had another fall in November which led to cervical process fractures, rib fractures, and distal clavicle fracture.      Her symptoms are rated as moderate to severe, and have been worsening.  This is significantly impairing activities of daily living.    Please see clinic note for further details on this patient's care.    She has elected for surgical management.   Past Medical History:  Diagnosis Date  . Cancer ([place])    thyroid  . Carotid artery stenosis   . COPD (chronic obstructive pulmonary disease) ([place])   . Diabetes mellitus without complication ([place])    type 2  . Hyperlipidemia   . Hypertension   . Hypothyroidism   . Insomnia   . Migraine    with a seizue  . Seizures ([place]) 02/04/2020  . Stroke ([place]) 12/2019   H/O  . Thyroid disease    cancer   Past Surgical History:  Procedure Laterality Date  .  APPENDECTOMY  1979  . BACK SURGERY  2010   several  . CAROTID ENDARTERECTOMY Left 09/2019  . CHOLECYSTECTOMY  2007  . OOPHORECTOMY Right 2008   one  . SPINAL CORD STIMULATOR IMPLANT  2013, 2017  . TOTAL THYROIDECTOMY  1992   Social History   Socioeconomic History  . Marital status: Divorced    Spouse name: Not on file  . Number of children: 1  . Years of education: Not on file  . Highest education level: Associate degree: academic program  Occupational History    Comment: [employer]  Tobacco Use  . Smoking status: Current Every Day Smoker    Packs/day: 0.50    Years: 15.00    Pack years: 7.50    Types: Cigarettes  . Smokeless tobacco: Never Used  Vaping Use  . Vaping Use: Never used  Substance and Sexual Activity  . Alcohol use: Not Currently  . Drug use: Not Currently    Comment: used 40 yrs ago  . Sexual activity: Not on file  Other Topics Concern  . Not on file  Social History Narrative   Lives with son   Caffeine- coffee 4 c daily or more   Social Determinants of Health   Financial Resource Strain: Not on file  Food Insecurity: Not on file  Transportation Needs: Not on file  Physical Activity: Not on file  Stress: Not on file  Social Connections: Not on file   Family History  Problem Relation Age of Onset  . Heart attack Mother   . Arthritis Mother   . Arthritis Father   . Hypertension Sister   . Hypertension Brother   . Stroke Maternal Grandfather    Allergies  Allergen Reactions  . Codeine Itching  . Hydrocodone Itching   Prior to Admission medications   Medication Sig Start Date End Date Taking? Authorizing Provider  acetaminophen (TYLENOL) 500 MG tablet Take 1,000 mg by mouth every 6 (six) hours as needed for moderate pain or headache.   Yes [provider]  atorvastatin (LIPITOR) 80 MG tablet Take 80 mg by mouth daily.  12/29/19  Yes [provider]  clopidogrel (PLAVIX) 75 MG tablet Take 75 mg by mouth daily. 01/09/20  Yes  [provider]  empagliflozin (JARDIANCE) 25 MG TABS tablet Take 25 mg by mouth daily.   Yes [provider]  fluticasone (FLONASE) 50 MCG/ACT nasal spray Place 1 spray into both nostrils daily as needed for allergies or rhinitis.   Yes [provider]  Gabapentin, Once-Daily, (GRALISE) 600 MG TABS Take 1,800 mg by mouth at bedtime.   Yes [provider]  HUMALOG KWIKPEN 100 UNIT/ML KwikPen Inject 15-50 Units into the skin See admin instructions. Inject 15 units with breakfast, 15 units with lunch, and 50 units at supper 01/21/20  Yes [provider]  levETIRAcetam (KEPPRA) 500 MG tablet Take 1 tablet (500 mg total) by mouth 2 (two) times daily. 03/15/20  Yes Penumalli, Earlean Polka, MD  levothyroxine (SYNTHROID) 175 MCG tablet Take 175 mcg by mouth daily before breakfast.   Yes [provider]  lisinopril (ZESTRIL) 10 MG tablet Take 10 mg by mouth daily.  12/14/19  Yes [provider]  metFORMIN (GLUCOPHAGE) 500 MG tablet Take 1,000-1,500 mg by mouth See admin instructions. Take 1500 mg in the morning and 1000 mg at night 01/25/20  Yes [provider]  Tapentadol HCl (NUCYNTA) 100 MG TABS Take 100 mg by mouth 2 (two) times daily.   Yes [provider]  TOUJEO SOLOSTAR 300 UNIT/ML Solostar Pen Inject 60 Units into the skin at bedtime.  01/21/20  Yes [provider]  zolpidem (AMBIEN) 5 MG tablet Take 5 mg by mouth at bedtime.   Yes [provider]  nicotine (NICODERM CQ - DOSED IN MG/24 HOURS) 14 mg/24hr patch Place 1 patch (14 mg total) onto the skin daily. Patient not taking: No sig reported 03/12/20   Johnathan Hausen, MD  traMADol (ULTRAM) 50 MG tablet Take 1-2 tablets (50-100 mg total) by mouth every 6 (six) hours as needed (50mg  for moderate pain, 100mg  for severe pain). Patient not taking: No sig reported 03/11/20   Johnathan Hausen, MD    ROS: All other systems have been reviewed and were otherwise  negative with the exception of those mentioned in the HPI and as above.  Physical Exam: General: Alert, no acute distress Cardiovascular: No pedal edema Respiratory: No cyanosis, no use of accessory musculature GI: No organomegaly, abdomen is soft and non-tender Skin: No lesions in the area of chief complaint Neurologic: Sensation intact distally Psychiatric: Patient is competent for consent with normal mood and affect Lymphatic: No axillary or cervical lymphadenopathy  MUSCULOSKELETAL:  Left shoulder: Tender to palpation around the distal clavicle.  Range of motion passively to 90 degrees, limited by pain.    Imaging: X-rays today demonstrate a mild asymmetry of the distal clavicle, consistent with  a fracture and possible CC ligament injury.    Previous CT arthrogram demonstrates a retracted cuff tear that is massive in nature involving three tendons and is at least to the level of the glenoid.  Assessment: - Left shoulder near irreparable cuff tear.   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  Plan: Plan for Procedure(s): REVERSE SHOULDER ARTHROPLASTY AND LEFT SHOULDER OPEN DISTAL CLAVICLE EXCISION  The risks benefits and alternatives were discussed with the patient including but not limited to the risks of nonoperative treatment, versus surgical intervention including infection, bleeding, nerve injury,  blood clots, cardiopulmonary complications, morbidity, mortality, among others, and they were willing to proceed.   We additionally specifically discussed risks of axillary nerve injury, infection, periprosthetic fracture, continued pain and longevity of implants prior to beginning procedure.    Patient will be closely monitored in PACU for medical stabilization and pain control. If found stable in PACU, patient may be discharged home with outpatient follow-up. If any concerns regarding patient's stabilization patient will be admitted for observation after  surgery. The patient is planning to be discharged home with outpatient PT.   The patient acknowledged the explanation, agreed to proceed with the plan and consent was signed.   Patient received operative clearance from:  - PCP, Dr. Janine Limbo,  hold Plavix 5 days prior to surgery  - Neurologist, Dr. Andrey Spearman    - PM&R, Dr. Nicholaus Bloom  - Vascular, Dr. Leroy Libman  Operative Plan: Left reverse total shoulder arthroplasty with open distal clavicle excision Discharge Medications: Tylenol, Oxycodone, Zofran, Robaxin DVT Prophylaxis: resume Plavix Physical Therapy: Outpatient PT Special Discharge needs: [place], PA-C  05/09/2020 7:22 AM

## 2020-05-09 NOTE — Anesthesia Preprocedure Evaluation (Addendum)
Anesthesia Evaluation  Patient identified by MRN, date of birth, ID band Patient awake    Reviewed: Allergy & Precautions, NPO status , Patient's Chart, lab work & pertinent test results  Airway Mallampati: II  TM Distance: >3 FB Neck ROM: Full    Dental no notable dental hx. (+) Teeth Intact, Dental Advisory Given,    Pulmonary COPD,  COPD inhaler, Current SmokerPatient did not abstain from smoking.,    Pulmonary exam normal breath sounds clear to auscultation       Cardiovascular Exercise Tolerance: Good hypertension, Pt. on medications negative cardio ROS Normal cardiovascular exam Rhythm:Regular Rate:Normal  SR R 89 prolonged PR   Neuro/Psych  Headaches, Seizures -, Well Controlled,  Last SZ 10/21 CVA, No Residual Symptoms negative psych ROS   GI/Hepatic negative GI ROS, Neg liver ROS,   Endo/Other  diabetes, Well Controlled, Type 2, Oral Hypoglycemic AgentsHypothyroidism   Renal/GU negative Renal ROSK+ 4.7 Cr 1.03     Musculoskeletal negative musculoskeletal ROS (+)   Abdominal (+) - obese,   Peds  Hematology Hgb 13.0 Plt 301   Anesthesia Other Findings All : codeine, hydrocodone  Pt w L eye Droop   Reproductive/Obstetrics                         Anesthesia Physical Anesthesia Plan  ASA: III  Anesthesia Plan: General   Post-op Pain Management:  Regional for Post-op pain   Induction: Intravenous  PONV Risk Score and Plan: 3 and Midazolam, Treatment may vary due to age or medical condition and Ondansetron  Airway Management Planned: Oral ETT  Additional Equipment:   Intra-op Plan:   Post-operative Plan: Extubation in OR  Informed Consent: I have reviewed the patients History and Physical, chart, labs and discussed the procedure including the risks, benefits and alternatives for the proposed anesthesia with the patient or authorized representative who has indicated his/her  understanding and acceptance.     Dental advisory given  Plan Discussed with: CRNA and Anesthesiologist  Anesthesia Plan Comments: (See PAT note 05/03/2020, Konrad Felix, PA-C  GA w L ISB w Exparel)      Anesthesia Quick Evaluation

## 2020-05-10 ENCOUNTER — Ambulatory Visit (HOSPITAL_COMMUNITY): Payer: BLUE CROSS/BLUE SHIELD

## 2020-05-10 ENCOUNTER — Ambulatory Visit (HOSPITAL_COMMUNITY)
Admission: RE | Admit: 2020-05-10 | Discharge: 2020-05-10 | Disposition: A | Discharge status: Home / Self Care | Payer: BLUE CROSS/BLUE SHIELD | Source: Ambulatory Visit | Attending: Orthopaedic Surgery | Admitting: Orthopaedic Surgery

## 2020-05-10 ENCOUNTER — Encounter (HOSPITAL_COMMUNITY)
Admission: RE | Disposition: A | Discharge status: Home / Self Care | Payer: Self-pay | Source: Ambulatory Visit | Attending: Orthopaedic Surgery

## 2020-05-10 ENCOUNTER — Ambulatory Visit (HOSPITAL_COMMUNITY): Payer: BLUE CROSS/BLUE SHIELD | Admitting: Physician Assistant

## 2020-05-10 ENCOUNTER — Ambulatory Visit (HOSPITAL_COMMUNITY): Payer: BLUE CROSS/BLUE SHIELD | Admitting: Certified Registered"

## 2020-05-10 ENCOUNTER — Encounter (HOSPITAL_COMMUNITY): Payer: Self-pay | Admitting: Orthopaedic Surgery

## 2020-05-10 DIAGNOSIS — I10 Essential (primary) hypertension: Secondary | ICD-10-CM | POA: Insufficient documentation

## 2020-05-10 DIAGNOSIS — Z79899 Other long term (current) drug therapy: Secondary | ICD-10-CM | POA: Insufficient documentation

## 2020-05-10 DIAGNOSIS — Z8669 Personal history of other diseases of the nervous system and sense organs: Secondary | ICD-10-CM | POA: Diagnosis not present

## 2020-05-10 DIAGNOSIS — Z8249 Family history of ischemic heart disease and other diseases of the circulatory system: Secondary | ICD-10-CM | POA: Diagnosis not present

## 2020-05-10 DIAGNOSIS — E119 Type 2 diabetes mellitus without complications: Secondary | ICD-10-CM | POA: Diagnosis not present

## 2020-05-10 DIAGNOSIS — Z7902 Long term (current) use of antithrombotics/antiplatelets: Secondary | ICD-10-CM | POA: Insufficient documentation

## 2020-05-10 DIAGNOSIS — Z794 Long term (current) use of insulin: Secondary | ICD-10-CM | POA: Insufficient documentation

## 2020-05-10 DIAGNOSIS — S46012A Strain of muscle(s) and tendon(s) of the rotator cuff of left shoulder, initial encounter: Secondary | ICD-10-CM | POA: Insufficient documentation

## 2020-05-10 DIAGNOSIS — M19012 Primary osteoarthritis, left shoulder: Secondary | ICD-10-CM | POA: Diagnosis not present

## 2020-05-10 DIAGNOSIS — Z85850 Personal history of malignant neoplasm of thyroid: Secondary | ICD-10-CM | POA: Insufficient documentation

## 2020-05-10 DIAGNOSIS — F17210 Nicotine dependence, cigarettes, uncomplicated: Secondary | ICD-10-CM | POA: Insufficient documentation

## 2020-05-10 DIAGNOSIS — Z8261 Family history of arthritis: Secondary | ICD-10-CM | POA: Insufficient documentation

## 2020-05-10 DIAGNOSIS — Z823 Family history of stroke: Secondary | ICD-10-CM | POA: Diagnosis not present

## 2020-05-10 DIAGNOSIS — Z8673 Personal history of transient ischemic attack (TIA), and cerebral infarction without residual deficits: Secondary | ICD-10-CM | POA: Diagnosis not present

## 2020-05-10 DIAGNOSIS — J449 Chronic obstructive pulmonary disease, unspecified: Secondary | ICD-10-CM | POA: Insufficient documentation

## 2020-05-10 DIAGNOSIS — Y9389 Activity, other specified: Secondary | ICD-10-CM | POA: Insufficient documentation

## 2020-05-10 DIAGNOSIS — Z951 Presence of aortocoronary bypass graft: Secondary | ICD-10-CM | POA: Insufficient documentation

## 2020-05-10 DIAGNOSIS — W010XXA Fall on same level from slipping, tripping and stumbling without subsequent striking against object, initial encounter: Secondary | ICD-10-CM | POA: Insufficient documentation

## 2020-05-10 DIAGNOSIS — S42032A Displaced fracture of lateral end of left clavicle, initial encounter for closed fracture: Secondary | ICD-10-CM | POA: Diagnosis not present

## 2020-05-10 DIAGNOSIS — Z885 Allergy status to narcotic agent status: Secondary | ICD-10-CM | POA: Diagnosis not present

## 2020-05-10 DIAGNOSIS — Z09 Encounter for follow-up examination after completed treatment for conditions other than malignant neoplasm: Secondary | ICD-10-CM

## 2020-05-10 HISTORY — PX: REVERSE SHOULDER ARTHROPLASTY: SHX5054

## 2020-05-10 LAB — GLUCOSE, CAPILLARY
Glucose-Capillary: 109 mg/dL — ABNORMAL HIGH (ref 70–99)
Glucose-Capillary: 108 mg/dL — ABNORMAL HIGH (ref 70–99)

## 2020-05-10 SURGERY — ARTHROPLASTY, SHOULDER, TOTAL, REVERSE
Anesthesia: General | Site: Shoulder | Laterality: Left

## 2020-05-10 MED ORDER — PROPOFOL 10 MG/ML IV BOLUS
INTRAVENOUS | Status: DC | PRN
  Administered 2020-05-10: 150 mg via INTRAVENOUS

## 2020-05-10 MED ORDER — ONDANSETRON HCL 4 MG PO TABS: 4.0000 mg | ORAL_TABLET | Freq: Three times a day (TID) | ORAL | 1 refills | Status: AC | PRN

## 2020-05-10 MED ORDER — ACETAMINOPHEN 500 MG PO TABS
1000.0000 mg | ORAL_TABLET | Freq: Once | ORAL | Status: AC
  Administered 2020-05-10: 1000 mg via ORAL
  Filled 2020-05-10: qty 2

## 2020-05-10 MED ORDER — AMISULPRIDE (ANTIEMETIC) 5 MG/2ML IV SOLN: 10.0000 mg | Freq: Once | INTRAVENOUS | Status: DC | PRN

## 2020-05-10 MED ORDER — OXYCODONE HCL 5 MG PO TABS: ORAL_TABLET | ORAL | 0 refills | Status: AC

## 2020-05-10 MED ORDER — HYDROMORPHONE HCL 1 MG/ML IJ SOLN
0.2500 mg | INTRAMUSCULAR | Status: DC | PRN
  Administered 2020-05-10: 0.5 mg via INTRAVENOUS

## 2020-05-10 MED ORDER — CEFAZOLIN SODIUM-DEXTROSE 2-4 GM/100ML-% IV SOLN
2.0000 g | INTRAVENOUS | Status: AC
  Administered 2020-05-10: 2 g via INTRAVENOUS
  Filled 2020-05-10: qty 100

## 2020-05-10 MED ORDER — LIDOCAINE HCL (CARDIAC) PF 100 MG/5ML IV SOSY
PREFILLED_SYRINGE | INTRAVENOUS | Status: DC | PRN
  Administered 2020-05-10: 100 mg via INTRAVENOUS

## 2020-05-10 MED ORDER — LIDOCAINE HCL (PF) 2 % IJ SOLN
INTRAMUSCULAR | Status: AC
  Filled 2020-05-10: qty 5

## 2020-05-10 MED ORDER — MIDAZOLAM HCL 5 MG/5ML IJ SOLN
INTRAMUSCULAR | Status: DC | PRN
  Administered 2020-05-10: 2 mg via INTRAVENOUS

## 2020-05-10 MED ORDER — MIDAZOLAM HCL 2 MG/2ML IJ SOLN
INTRAMUSCULAR | Status: AC
  Filled 2020-05-10: qty 2

## 2020-05-10 MED ORDER — STERILE WATER FOR IRRIGATION IR SOLN
Status: DC | PRN
  Administered 2020-05-10: 2000 mL

## 2020-05-10 MED ORDER — VANCOMYCIN HCL 1000 MG IV SOLR
INTRAVENOUS | Status: AC
  Filled 2020-05-10: qty 1000

## 2020-05-10 MED ORDER — ONDANSETRON HCL 4 MG/2ML IJ SOLN
INTRAMUSCULAR | Status: DC | PRN
  Administered 2020-05-10: 4 mg via INTRAVENOUS

## 2020-05-10 MED ORDER — PHENYLEPHRINE HCL (PRESSORS) 10 MG/ML IV SOLN
INTRAVENOUS | Status: AC
  Filled 2020-05-10: qty 1

## 2020-05-10 MED ORDER — BUPIVACAINE HCL (PF) 0.5 % IJ SOLN
INTRAMUSCULAR | Status: DC | PRN
  Administered 2020-05-10: 15 mL

## 2020-05-10 MED ORDER — ONDANSETRON HCL 4 MG/2ML IJ SOLN
INTRAMUSCULAR | Status: AC
  Filled 2020-05-10: qty 2

## 2020-05-10 MED ORDER — SODIUM CHLORIDE 0.9 % IR SOLN
Status: DC | PRN
  Administered 2020-05-10: 1000 mL

## 2020-05-10 MED ORDER — LACTATED RINGERS IV SOLN: INTRAVENOUS | Status: DC

## 2020-05-10 MED ORDER — ACETAMINOPHEN 500 MG PO TABS: 1000.0000 mg | ORAL_TABLET | Freq: Three times a day (TID) | ORAL | 0 refills | Status: AC

## 2020-05-10 MED ORDER — PHENYLEPHRINE HCL-NACL 10-0.9 MG/250ML-% IV SOLN
INTRAVENOUS | Status: DC | PRN
  Administered 2020-05-10: 40 ug/min via INTRAVENOUS

## 2020-05-10 MED ORDER — CELECOXIB 200 MG PO CAPS
400.0000 mg | ORAL_CAPSULE | Freq: Once | ORAL | Status: AC
  Administered 2020-05-10: 400 mg via ORAL
  Filled 2020-05-10: qty 2

## 2020-05-10 MED ORDER — GLYCOPYRROLATE 0.2 MG/ML IJ SOLN: INTRAMUSCULAR | Status: DC | PRN

## 2020-05-10 MED ORDER — FENTANYL CITRATE (PF) 100 MCG/2ML IJ SOLN
INTRAMUSCULAR | Status: DC | PRN
  Administered 2020-05-10: 50 ug via INTRAVENOUS
  Administered 2020-05-10 (×2): 25 ug via INTRAVENOUS

## 2020-05-10 MED ORDER — METHOCARBAMOL 500 MG PO TABS: 500.0000 mg | ORAL_TABLET | Freq: Three times a day (TID) | ORAL | 0 refills | Status: AC | PRN

## 2020-05-10 MED ORDER — MEPERIDINE HCL 50 MG/ML IJ SOLN: 6.2500 mg | INTRAMUSCULAR | Status: DC | PRN

## 2020-05-10 MED ORDER — ORAL CARE MOUTH RINSE: 15.0000 mL | Freq: Once | OROMUCOSAL | Status: AC

## 2020-05-10 MED ORDER — OXYCODONE HCL 5 MG/5ML PO SOLN
5.0000 mg | Freq: Once | ORAL | Status: DC | PRN
Start: 2020-05-10 — End: 2020-05-10

## 2020-05-10 MED ORDER — CHLORHEXIDINE GLUCONATE 0.12 % MT SOLN
15.0000 mL | Freq: Once | OROMUCOSAL | Status: AC
  Administered 2020-05-10: 15 mL via OROMUCOSAL

## 2020-05-10 MED ORDER — 0.9 % SODIUM CHLORIDE (POUR BTL) OPTIME
TOPICAL | Status: DC | PRN
  Administered 2020-05-10: 1000 mL

## 2020-05-10 MED ORDER — HYDROMORPHONE HCL 1 MG/ML IJ SOLN
INTRAMUSCULAR | Status: AC
  Filled 2020-05-10: qty 1

## 2020-05-10 MED ORDER — SUGAMMADEX SODIUM 200 MG/2ML IV SOLN
INTRAVENOUS | Status: DC | PRN
  Administered 2020-05-10: 200 mg via INTRAVENOUS
  Administered 2020-05-10: 50 mg via INTRAVENOUS

## 2020-05-10 MED ORDER — ACETAMINOPHEN 10 MG/ML IV SOLN: 1000.0000 mg | Freq: Once | INTRAVENOUS | Status: DC | PRN

## 2020-05-10 MED ORDER — ROCURONIUM BROMIDE 10 MG/ML (PF) SYRINGE
PREFILLED_SYRINGE | INTRAVENOUS | Status: AC
  Filled 2020-05-10: qty 10

## 2020-05-10 MED ORDER — VANCOMYCIN HCL 1 G IV SOLR
INTRAVENOUS | Status: DC | PRN
  Administered 2020-05-10: 1000 mg via TOPICAL

## 2020-05-10 MED ORDER — DEXAMETHASONE SODIUM PHOSPHATE 10 MG/ML IJ SOLN
INTRAMUSCULAR | Status: DC | PRN
  Administered 2020-05-10: 5 mg via INTRAVENOUS

## 2020-05-10 MED ORDER — FENTANYL CITRATE (PF) 100 MCG/2ML IJ SOLN
INTRAMUSCULAR | Status: AC
  Filled 2020-05-10: qty 2

## 2020-05-10 MED ORDER — PROPOFOL 10 MG/ML IV BOLUS
INTRAVENOUS | Status: AC
  Filled 2020-05-10: qty 20

## 2020-05-10 MED ORDER — DEXAMETHASONE SODIUM PHOSPHATE 10 MG/ML IJ SOLN
INTRAMUSCULAR | Status: AC
  Filled 2020-05-10: qty 1

## 2020-05-10 MED ORDER — ONDANSETRON HCL 4 MG/2ML IJ SOLN: 4.0000 mg | Freq: Once | INTRAMUSCULAR | Status: DC | PRN

## 2020-05-10 MED ORDER — BUPIVACAINE LIPOSOME 1.3 % IJ SUSP
INTRAMUSCULAR | Status: DC | PRN
  Administered 2020-05-10: 10 mL via PERINEURAL

## 2020-05-10 MED ORDER — GLYCOPYRROLATE 0.2 MG/ML IJ SOLN
INTRAMUSCULAR | Status: DC | PRN
  Administered 2020-05-10: .2 mg via INTRAVENOUS

## 2020-05-10 MED ORDER — OXYCODONE HCL 5 MG PO TABS
5.0000 mg | ORAL_TABLET | Freq: Once | ORAL | Status: DC | PRN
Start: 2020-05-10 — End: 2020-05-10

## 2020-05-10 MED ORDER — ROCURONIUM BROMIDE 100 MG/10ML IV SOLN
INTRAVENOUS | Status: DC | PRN
  Administered 2020-05-10: 60 mg via INTRAVENOUS

## 2020-05-10 MED ORDER — LACTATED RINGERS IV BOLUS
500.0000 mL | Freq: Once | INTRAVENOUS | Status: AC
  Administered 2020-05-10: 500 mL via INTRAVENOUS

## 2020-05-10 SURGICAL SUPPLY — 66 items
BASEPLATE GLENOSPHERE 25 STD (Miscellaneous) ×2 IMPLANT
BLADE SAW SAG 73X25 THK (BLADE) ×1
BLADE SAW SGTL 73X25 THK (BLADE) ×1 IMPLANT
BLADE SAW SGTL 81X20 HD (BLADE) ×2 IMPLANT
CHLORAPREP W/TINT 26 (MISCELLANEOUS) ×4 IMPLANT
CLSR STERI-STRIP ANTIMIC 1/2X4 (GAUZE/BANDAGES/DRESSINGS) ×2 IMPLANT
COOLER ICEMAN CLASSIC (MISCELLANEOUS) ×2 IMPLANT
COVER BACK TABLE 60X90IN (DRAPES) ×2 IMPLANT
COVER SURGICAL LIGHT HANDLE (MISCELLANEOUS) ×2 IMPLANT
COVER WAND RF STERILE (DRAPES) ×2 IMPLANT
DRAPE INCISE IOBAN 66X45 STRL (DRAPES) ×2 IMPLANT
DRAPE ORTHO SPLIT 77X108 STRL (DRAPES) ×4
DRAPE SHEET LG 3/4 BI-LAMINATE (DRAPES) ×4 IMPLANT
DRAPE SURG ORHT 6 SPLT 77X108 (DRAPES) ×2 IMPLANT
DRESSING AQUACEL AG SP 3.5X6 (GAUZE/BANDAGES/DRESSINGS) ×1 IMPLANT
DRSG AQUACEL AG ADV 3.5X 6 (GAUZE/BANDAGES/DRESSINGS) ×2 IMPLANT
DRSG AQUACEL AG SP 3.5X6 (GAUZE/BANDAGES/DRESSINGS) ×2
ELECT BLADE TIP CTD 4 INCH (ELECTRODE) ×2 IMPLANT
ELECT REM PT RETURN 15FT ADLT (MISCELLANEOUS) ×2 IMPLANT
GLENOSPHERE REV SHOULDER 36 (Joint) ×2 IMPLANT
GLOVE ECLIPSE 8.0 STRL XLNG CF (GLOVE) ×4 IMPLANT
GLOVE SRG 8 PF TXTR STRL LF DI (GLOVE) ×1 IMPLANT
GLOVE SURG ENC MOIS LTX SZ6.5 (GLOVE) ×4 IMPLANT
GLOVE SURG UNDER POLY LF SZ6.5 (GLOVE) ×2 IMPLANT
GLOVE SURG UNDER POLY LF SZ8 (GLOVE) ×2
GOWN STRL REUS W/TWL LRG LVL3 (GOWN DISPOSABLE) ×2 IMPLANT
GOWN STRL REUS W/TWL XL LVL3 (GOWN DISPOSABLE) ×2 IMPLANT
GUIDEWIRE GLENOID 2.5X220 (WIRE) ×2 IMPLANT
HANDPIECE INTERPULSE COAX TIP (DISPOSABLE) ×2
HEMOSTAT SURGICEL 2X14 (HEMOSTASIS) IMPLANT
IMPL REVERSE SHOULDER 0X3.5 (Shoulder) ×1 IMPLANT
IMPLANT REVERSE SHOULDER 0X3.5 (Shoulder) ×2 IMPLANT
INSERT HUMERAL 36X6MM 12.5DEG (Insert) ×2 IMPLANT
KIT BASIN OR (CUSTOM PROCEDURE TRAY) ×2 IMPLANT
KIT STABILIZATION SHOULDER (MISCELLANEOUS) ×2 IMPLANT
KIT TURNOVER KIT A (KITS) ×2 IMPLANT
MANIFOLD NEPTUNE II (INSTRUMENTS) ×2 IMPLANT
NEEDLE MAYO CATGUT SZ4 (NEEDLE) IMPLANT
NS IRRIG 1000ML POUR BTL (IV SOLUTION) ×2 IMPLANT
PACK SHOULDER (CUSTOM PROCEDURE TRAY) ×2 IMPLANT
PAD COLD SHLDR WRAP-ON (PAD) IMPLANT
PENCIL SMOKE EVACUATOR (MISCELLANEOUS) IMPLANT
RESTRAINT HEAD UNIVERSAL NS (MISCELLANEOUS) ×2 IMPLANT
SCREW 5.5X22 (Screw) ×2 IMPLANT
SCREW BONE THREAD 6.5X35 (Screw) ×2 IMPLANT
SCREW PERIPHERAL 42 (Screw) ×2 IMPLANT
SET HNDPC FAN SPRY TIP SCT (DISPOSABLE) ×1 IMPLANT
SLING ARM IMMOBILIZER MED (SOFTGOODS) ×2 IMPLANT
SLING ULTRA II L (ORTHOPEDIC SUPPLIES) IMPLANT
SLING ULTRA III MED (ORTHOPEDIC SUPPLIES) ×2 IMPLANT
STEM HUMERAL SZ2B STND 70 PTC (Stem) ×2 IMPLANT
STEM HUMERAL SZ2BSTD 70 PTC (Stem) ×1 IMPLANT
STRIP CLOSURE SKIN 1/2X4 (GAUZE/BANDAGES/DRESSINGS) ×2 IMPLANT
SUCTION FRAZIER HANDLE 12FR (TUBING) ×2
SUCTION TUBE FRAZIER 12FR DISP (TUBING) ×1 IMPLANT
SUT ETHIBOND 2 V 37 (SUTURE) ×2 IMPLANT
SUT ETHIBOND NAB CT1 #1 30IN (SUTURE) ×2 IMPLANT
SUT FIBERWIRE #5 38 CONV NDL (SUTURE)
SUT MNCRL AB 4-0 PS2 18 (SUTURE) ×2 IMPLANT
SUT VIC AB 0 CT1 36 (SUTURE) IMPLANT
SUT VIC AB 3-0 SH 27 (SUTURE) ×2
SUT VIC AB 3-0 SH 27X BRD (SUTURE) ×1 IMPLANT
SUTURE FIBERWR #5 38 CONV NDL (SUTURE) IMPLANT
TOWEL OR 17X26 10 PK STRL BLUE (TOWEL DISPOSABLE) ×2 IMPLANT
TUBE SUCTION HIGH CAP CLEAR NV (SUCTIONS) ×2 IMPLANT
WATER STERILE IRR 1000ML POUR (IV SOLUTION) ×4 IMPLANT

## 2020-05-10 NOTE — Anesthesia Procedure Notes (Addendum)
Anesthesia Regional Block: Interscalene brachial plexus block   Pre-Anesthetic Checklist: ,, timeout performed, Correct Patient, Correct Site, Correct Laterality, Correct Procedure, Correct Position, site marked, Risks and benefits discussed,  Surgical consent,  Pre-op evaluation,  At surgeon's request and post-op pain management  Laterality: Upper and Left  Prep: Maximum Sterile Barrier Precautions used, chloraprep       Needles:  Injection technique: Single-shot  Needle Type: Echogenic Needle     Needle Length: 5cm  Needle Gauge: 21     Additional Needles:   Procedures:,,,, ultrasound used (permanent image in chart),,,,  Narrative:  Start time: 05/10/2020 8:12 AM End time: 05/10/2020 8:19 AM Injection made incrementally with aspirations every 5 mL.  Performed by: Personally  Anesthesiologist: Barnet Glasgow, MD  Additional Notes: Block assessed prior to procedure. Patient tolerated procedure well.

## 2020-05-10 NOTE — Op Note (Signed)
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  Patient's physiologic age Terri Li poor candidate for joint salvage.  She had a routine reverse shoulder arthroplasty and the nerve is intact and then the case.  Her nonunion of her distal clavicle was apparent we are able to resect it without issue.  We were able to bevel the posterior aspect of the nonunion site to avoid impingement with the acromio  About 12 mm of total clavicle was resected when combined the nonunion associated fragment and the beveling.  Post-operative plan: The patient will be NWB in sling.  The patient will be will be discharged from PACU if continues to be stable as was plan prior to surgery.  DVT prophylaxis Aspirin 81 mg twice daily for 6 weeks.  Pain control with PRN pain medication preferring oral medicines.  Follow up plan will be scheduled in approximately 7 days for incision check and XR.  Physical therapy to start after first visit.  Implants: Tornier size 2B stem, 0 high offset tray and a 6 6 mm polyethylene, 36 standard glenosphere and a 25 standard baseplate with a 35 center screw Post-Op Diagnosis: Same Surgeons:Primary: Terri Gash, MD Assistants:Caroline McBane PA-C Location: [facility] ROOM 06 Anesthesia: General with Exparel Interscalene Antibiotics: Ancef 2g preop, Vancomycin 1091m locally Tourniquet time: None Estimated Blood Loss: 1785Complications: None Specimens: None Implants: Implant Name Type Inv. Item Serial No. Manufacturer Lot No. LRB No. Used Action  BASEPLATE GLENOSPHERE 288FOSTD - [id number] BASEPLATE GLENOSPHERE 276HMSTD [id number]  Left 1 Implanted  GLENOSPHERE REV SHOULDER 36 -  [id number] GLENOSPHERE REV SHOULDER 36 [id number]  Left 1 Implanted  BONE SCREW THREAD 6.5X35MM - [id number] BONE SCREW THREAD 6.5X35MM  [employer]  Left 1 Implanted  SCREW PERIPHERAL 42 - [id number] SCREW PERIPHERAL 42  [employer]  Left 1 Implanted  SCREW 5.5X22 - [id number] SCREW 5.5X22  [employer]  Left 1 Implanted  STEM HUMERAL SZ2B STND 70 PTC - [id number] STEM HUMERAL SZ2B STND 70 PTC [id number]  Left 1 Implanted  INSERT HUMERAL 36X6MM 12.5DEG - [id number] INSERT HUMERAL 36X6MM 12.5DEG [id number]  Left 1 Implanted  IMPLANT REVERSE SHOULDER 0X3.5 - [id number] IMPLANT REVERSE SHOULDER 0X3.5 [id number]  Left 1 Implanted    Indications for Surgery:   Terri CISEKis a 60y.o. female with rotator cuff arthropathy and recent fall resulting in distal clavicle fracture and nonunion.  Benefits and risks of operative and nonoperative management were discussed prior to surgery with patient/guardian(s) and informed consent form was completed.  Infection and need for further surgery were discussed as was prosthetic stability and cuff issues.  We additionally specifically discussed risks of axillary nerve injury, infection, periprosthetic fracture, continued pain and longevity of implants prior to beginning procedure.      Procedure:   The patient was identified in the preoperative holding area where the surgical site was marked. Block placed by anesthesia with exparel.  The patient was taken to the OR where a procedural timeout was called and the above noted anesthesia was induced.  The patient was positioned beachchair on allen table with spider arm positioner.  Preoperative antibiotics were dosed.  The patient's left shoulder was prepped and draped in the usual sterile fashion.  A second preoperative timeout was called.       Standard deltopectoral approach was performed with a #10 blade. We dissected down  to the subcutaneous tissues and the cephalic vein was taken laterally with the deltoid. Clavipectoral fascia was incised in line with the incision. Deep retractors were placed. The long of the biceps tendon was identified and there was significant tenosynovitis present.  Tenodesis was performed to the pectoralis tendon with #2 Ethibond. The remaining biceps was followed up into the rotator interval where it was released.   The subscapularis was taken down in a full thickness layer with capsule along the humeral neck extending inferiorly around the humeral head. We continued releasing the capsule directly off of the osteophytes inferiorly all the way around the corner. This allowed [place] to dislocate the humeral head.   The humeral head had evidence of severe osteoarthritic wear with full-thickness cartilage loss and exposed subchondral bone.   The rotator cuff was carefully examined and noted to be irreperably torn.  The decision was confirmed that a reverse total shoulder was indicated for this patient.  There were osteophytes along the inferior humeral neck. The osteophytes were removed with an osteotome and a rongeur.  Osteophytes were removed with a rongeur and an osteotome and the anatomic neck was well visualized.     A humeral cutting guide was inserted down the intramedullary canal. The version was set at 20 of retroversion. Humeral osteotomy was performed with an oscillating saw. The head fragment was passed off the back table. A starter awl was used to open the humeral canal. We next used T-handle straight sound reamers to ream up to an appropriate fit. A chisel was used to remove proximal humeral bone. We then broached starting with a size one broach and broaching up to 2 which obtained an appropriate fit. The broach handle was removed. A cut protector was placed. The broach handle was removed and a cut protector was placed. The humerus was retracted posteriorly and we turned our attention to  glenoid exposure.  The subscapularis was again identified and immediately we took care to palpate the axillary nerve anteriorly and verify its position with gentle palpation as well as the tug test.  We then released the SGHL with bovie cautery prior to placing a curved mayo at the junction of the anterior glenoid well above the axillary nerve and bluntly dissecting the subscapularis from the capsule.  We then carefully protected the axillary nerve as we gently released the inferior capsule to fully mobilize the subscapularis.  An anterior deltoid retractor was then placed as well as a small Hohmann retractor superiorly.   The glenoid had moderate osteoarthritic changes consistent with rotator cuff arthropathy.  The glenoid drill guide was placed and used to drill a guide pin in the center, inferior position. The glenoid face was then reamed concentrically over the guide wire. The center hole was drilled over the guidepin in a near anatomic angle of version. Next the  glenoid vault was drilled back to a depth of 35 mm.  We tapped and then placed a 48m size baseplate with 212mlateralization was selected with a 6.5 mm x 35 mm length central screw.  The base plate was screwed into the glenoid vault obtaining secure fixation. We next placed superior and inferior locking screws for additional fixation.  Next a 36 mm glenosphere was selected and impacted onto the baseplate. The  center screw was tightened.  We turned attention back to the humeral side. The cut protector was removed. We trialed with multiple size tray and polyethylene options and selected a 6 which provided good stability and range of motion without excess soft tissue tension. The offset was dialed in to match the normal anatomy. The shoulder was trialed.  There was good ROM in all planes and the shoulder was stable with no inferior translation.  The real humeral implants were opened after again confirming sizes.  The trial was removed. #5  Fiberwire x4 sutures passed through the humeral neck for subscap repair. The humeral component was press-fit obtaining a secure fit. A +0 high offset tray was selected and impacted onto the stem.  A 36+6 polyethylene liner was impacted onto the stem.  The joint was reduced and thoroughly irrigated with pulsatile lavage. Subscap was repaired back with #5 Fiberwire sutures through bone tunnels.   At this point we turned our attention of this clavicle.  8 cm skin incision proximally and went superficial to the deltoid.  We encountered the [facility] joint and were able to open it in line with the clavicle.  Once the capsule was opened it was apparent that there was a nonunion site.  We were able to preserve the capsule and resect the nonunion fragment.  We then used a small saw to bevel the remaining distal clavicle.  The distal clavicle fragment appeared to be stable at this point and there is no sign of a unstable CC ligament injury.  We irrigated and then closed the capsule with interrupted Vicryl suture watertight.   Hemostasis was obtained. The deltopectoral interval was reapproximated with #1 Ethibond. The subcutaneous tissues were closed with 2-0 Vicryl and the skin was closed with running monocryl.    The wounds were cleaned and dried and an Aquacel dressing was placed. The drapes taken down. The arm was placed into sling with abduction pillow. Patient was awakened, extubated, and transferred to the recovery room in stable condition. There were no intraoperative complications. The sponge, needle, and attention counts were  correct at the end of the case.        Noemi Chapel, PA-C, present and scrubbed throughout the case, critical for completion in a timely fashion, and for retraction, instrumentation, closure.

## 2020-05-10 NOTE — Anesthesia Procedure Notes (Signed)
Procedure Name: Intubation Date/Time: 05/10/2020 8:45 AM Performed by: Garrel Ridgel, CRNA Pre-anesthesia Checklist: Patient identified, Emergency Drugs available, Suction available and Patient being monitored Patient Re-evaluated:Patient Re-evaluated prior to induction Oxygen Delivery Method: Circle system utilized Preoxygenation: Pre-oxygenation with 100% oxygen Induction Type: IV induction Ventilation: Mask ventilation without difficulty Laryngoscope Size: Mac and 3 Grade View: Grade II Tube type: Oral Tube size: 7.0 mm Number of attempts: 1 Airway Equipment and Method: Stylet and Oral airway Placement Confirmation: ETT inserted through vocal cords under direct vision,  positive ETCO2 and breath sounds checked- equal and bilateral Secured at: 21 cm Tube secured with: Tape Dental Injury: Teeth and Oropharynx as per pre-operative assessment

## 2020-05-10 NOTE — Interval H&P Note (Signed)
History and Physical Interval Note:  05/10/2020 8:13 AM  Terri Li  has presented today for surgery, with the diagnosis of djd left shoulder.  The various methods of treatment have been discussed with the patient and family. After consideration of risks, benefits and other options for treatment, the patient has consented to  Procedure(s) with comments: [place] (Left) - LEFT SHOULDER OPEN DISTAL CLAVICLE EXCISION as a surgical intervention.  The patient's history has been reviewed, patient examined, no change in status, stable for surgery.  I have reviewed the patient's chart and labs.  Questions were answered to the patient's satisfaction.     Hiram Gash

## 2020-05-10 NOTE — Anesthesia Postprocedure Evaluation (Signed)
Anesthesia Post Note  Patient: Terri Li  Procedure(s) Performed: REVERSE SHOULDER ARTHROPLASTY AND LEFT SHOULDER OPEN DISTAL CLAVICLE EXCISION (Left Shoulder)     Patient location during evaluation: PACU Anesthesia Type: General Level of consciousness: awake and alert Pain management: pain level controlled Vital Signs Assessment: post-procedure vital signs reviewed and stable Respiratory status: spontaneous breathing, nonlabored ventilation, respiratory function stable and patient connected to nasal cannula oxygen Cardiovascular status: blood pressure returned to baseline and stable Postop Assessment: no apparent nausea or vomiting Anesthetic complications: no   No complications documented.  Last Vitals:  Vitals:   05/10/20 1130 05/10/20 1140  BP: 137/87 (!) 145/89  Pulse: 81 68  Resp: 12 18  Temp:  36.4 C  SpO2: 96% 97%    Last Pain:  Vitals:   05/10/20 1130  TempSrc:   PainSc: 3                  Barnet Glasgow

## 2020-05-10 NOTE — Transfer of Care (Signed)
Immediate Anesthesia Transfer of Care Note  Patient: Terri Li  Procedure(s) Performed: REVERSE SHOULDER ARTHROPLASTY AND LEFT SHOULDER OPEN DISTAL CLAVICLE EXCISION (Left Shoulder)  Patient Location: PACU  Anesthesia Type:General  Level of Consciousness: oriented, drowsy and patient cooperative  Airway & Oxygen Therapy: Patient Spontanous Breathing and Patient connected to face mask oxygen  Post-op Assessment: Report given to RN and Post -op Vital signs reviewed and stable  Post vital signs: Reviewed and stable  Last Vitals:  Vitals Value Taken Time  BP 138/78 05/10/20 1025  Temp    Pulse 89 05/10/20 1028  Resp 16 05/10/20 1028  SpO2 100 % 05/10/20 1028  Vitals shown include unvalidated device data.  Last Pain:  Vitals:   05/10/20 0642  TempSrc: Oral  PainSc:       Patients Stated Pain Goal: 5 (33/38/32 9191)  Complications: No complications documented.

## 2020-05-11 ENCOUNTER — Ambulatory Visit: Payer: 59 | Admitting: Neurology

## 2020-05-11 ENCOUNTER — Encounter (HOSPITAL_COMMUNITY): Payer: Self-pay | Admitting: Orthopaedic Surgery

## 2020-07-11 ENCOUNTER — Encounter: Payer: Self-pay | Admitting: Physician Assistant

## 2020-07-25 ENCOUNTER — Ambulatory Visit: Payer: 59 | Admitting: Diagnostic Neuroimaging

## 2020-08-15 ENCOUNTER — Encounter: Payer: Self-pay | Admitting: Diagnostic Neuroimaging

## 2020-08-15 ENCOUNTER — Ambulatory Visit (INDEPENDENT_AMBULATORY_CARE_PROVIDER_SITE_OTHER): Payer: BLUE CROSS/BLUE SHIELD | Admitting: Diagnostic Neuroimaging

## 2020-08-15 VITALS — BP 136/85 | HR 82 | Ht 67.0 in | Wt 193.4 lb

## 2020-08-15 DIAGNOSIS — G459 Transient cerebral ischemic attack, unspecified: Secondary | ICD-10-CM | POA: Diagnosis not present

## 2020-08-15 DIAGNOSIS — G40209 Localization-related (focal) (partial) symptomatic epilepsy and epileptic syndromes with complex partial seizures, not intractable, without status epilepticus: Secondary | ICD-10-CM

## 2020-08-15 MED ORDER — LEVETIRACETAM 500 MG PO TABS: 500.0000 mg | ORAL_TABLET | Freq: Two times a day (BID) | ORAL | 4 refills | Status: DC

## 2020-08-15 NOTE — Patient Instructions (Signed)
-   continue plavix, insulin, atorvastatin, lisinopril  - smoking cessation reviewed  - continue levetiracetam 500mg  twice a day   - may return to driving

## 2020-08-15 NOTE — Progress Notes (Signed)
[facility]  PATIENT: Terri Li DOB: 03/19/1961  REFERRING CLINICIAN: O'Buch, Greta, PA-C HISTORY FROM: patient  REASON FOR VISIT: as per HPI   HISTORICAL  CHIEF COMPLAINT:  Chief Complaint  Patient presents with  . Partial epilepsy    Rm 7, 4 month FU "Keppra affects my memory, can't retain information; no seizures, last one was 02/10/20""     HISTORY OF PRESENT ILLNESS:   UPDATE (08/15/20, VRP): Since last visit, doing well. Symptoms are resolved. No more seizures. Tolerating LEV. Some mental slowing with LEV.    PRIOR HPI: 60 year old female here for evaluation of abnormal spells.  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  02/04/2020 patient had episode at home where her head and eyes were deviated to the right side, right arm raised up into the air with abnormal movements, lasted for few minutes.  Patient had some fatigue and confusion afterwards.  She was having trouble talking.  She had 5-7 episodes at home on that day.  Her sister came home and witnessed 1 of these events and called 911.  Patient was taken the hospital.  By that time symptoms had resolved and patient was mainly complaining of headaches.  She had a CT scan of the head and headache evaluation.  She was discharged home.  Patient has ongoing similar episodes of right gaze and head deviation, right arm movements, confusion and speech difficulty on October 9, 10th, 12th, 14th.  Following this no further events.  Patient felt tired and confused afterwards.  Patient continues to have intermittent headaches.  On 03/07/2020 patient slipped and fell down some steps resulting in multiple C-spine, T-spine, L-spine, rib and left clavicle fractures.  She denies any seizure-like or strokelike activity at the onset of the fall and states that this was an accidental  slip.  No prior history of seizures.   REVIEW OF SYSTEMS: Full 14 system review of systems performed and negative with exception of: As per HPI.  ALLERGIES: Allergies  Allergen Reactions  . Codeine Itching  . Hydrocodone Itching    HOME MEDICATIONS: Outpatient Medications Prior to Visit  Medication Sig Dispense Refill  . atorvastatin (LIPITOR) 80 MG tablet Take 80 mg by mouth daily.     . celecoxib (CELEBREX) 100 MG capsule Take 100 mg by mouth 2 (two) times daily.    . clopidogrel (PLAVIX) 75 MG tablet Take 75 mg by mouth daily.    . empagliflozin (JARDIANCE) 25 MG TABS tablet Take 25 mg by mouth daily.    Marland Kitchen etodolac (LODINE) 400 MG tablet 1 tablet by mouth twice a day as needed    . fluticasone (FLONASE) 50 MCG/ACT nasal spray Place 1 spray into both nostrils daily as needed for allergies or rhinitis.    . Gabapentin, Once-Daily, (GRALISE) 600 MG TABS Take 1,800 mg by mouth at bedtime.    Marland Kitchen levothyroxine (SYNTHROID) 175 MCG tablet Take 175 mcg by mouth daily before breakfast.    . lisinopril (ZESTRIL) 10 MG tablet Take 10 mg by mouth daily.     . metFORMIN (GLUCOPHAGE) 500 MG tablet Take 1,000-1,500 mg by mouth See admin instructions. Take 1500 mg in the morning and 1000 mg at night    . methocarbamol (ROBAXIN) 500 MG tablet Take 1 tablet (500 mg total) by mouth every 8 (eight) hours as needed for muscle spasms. 20 tablet 0  . NOVOLOG FLEXPEN 100 UNIT/ML  FlexPen INJECT 15 UNITS SOLUTION PEN-INJECTOR WITH BREAKFAST AND LUNCH AND 25 WITH DINNER    . Tapentadol HCl (NUCYNTA) 100 MG TABS Take 100 mg by mouth 2 (two) times daily.    Nelva Nay SOLOSTAR 300 UNIT/ML Solostar Pen Inject 60 Units into the skin at bedtime.     Marland Kitchen zolpidem (AMBIEN) 5 MG tablet Take 5 mg by mouth at bedtime.    . levETIRAcetam (KEPPRA) 500 MG tablet Take 1 tablet (500 mg total) by mouth 2 (two) times daily. 180 tablet 4  . amitriptyline (ELAVIL) 10 MG tablet Take 10 mg by mouth at bedtime. (Patient not taking:  Reported on 08/15/2020)    . MOBIC 15 MG tablet 1 tablet by mouth once a day as needed stop etodolac and celebrex (Patient not taking: Reported on 08/15/2020)    . nicotine (NICODERM CQ - DOSED IN MG/24 HOURS) 14 mg/24hr patch Place 1 patch (14 mg total) onto the skin daily. (Patient not taking: No sig reported) 28 patch 0  . HUMALOG KWIKPEN 100 UNIT/ML KwikPen Inject 15-50 Units into the skin See admin instructions. Inject 15 units with breakfast, 15 units with lunch, and 50 units at supper     No facility-administered medications prior to visit.    PAST MEDICAL HISTORY: Past Medical History:  Diagnosis Date  . Cancer ([place])    thyroid  . Carotid artery stenosis   . COPD (chronic obstructive pulmonary disease) ([place])   . Diabetes mellitus without complication ([place])    type 2  . Hyperlipidemia   . Hypertension   . Hypothyroidism   . Insomnia   . Migraine    with a seizue  . Seizures ([place]) 02/04/2020  . Stroke ([place]) 12/2019   H/O  . Thyroid disease    cancer    PAST SURGICAL HISTORY: Past Surgical History:  Procedure Laterality Date  . APPENDECTOMY  1979  . BACK SURGERY  2010   several  . CAROTID ENDARTERECTOMY Left 09/2019  . CHOLECYSTECTOMY  2007  . OOPHORECTOMY Right 2008   one  . REVERSE SHOULDER ARTHROPLASTY Left 05/10/2020   Procedure: REVERSE SHOULDER ARTHROPLASTY AND LEFT SHOULDER OPEN DISTAL CLAVICLE EXCISION;  Surgeon: Hiram Gash, MD;  Location: [facility];  Service: Orthopedics;  Laterality: Left;  LEFT SHOULDER OPEN DISTAL CLAVICLE EXCISION  . SPINAL CORD STIMULATOR IMPLANT  2013, 2017  . TOTAL THYROIDECTOMY  1992    FAMILY HISTORY: Family History  Problem Relation Age of Onset  . Heart attack Mother   . Arthritis Mother   . Arthritis Father   . Hypertension Sister   . Lung cancer Sister   . Hypertension Brother   . Stroke Maternal Grandfather     SOCIAL HISTORY: Social History   Socioeconomic History  . Marital status: Divorced    Spouse name: Not  on file  . Number of children: 1  . Years of education: Not on file  . Highest education level: Associate degree: academic program  Occupational History    Comment: [employer]  Tobacco Use  . Smoking status: Current Every Day Smoker    Packs/day: 0.50    Years: 15.00    Pack years: 7.50    Types: Cigarettes  . Smokeless tobacco: Never Used  Vaping Use  . Vaping Use: Never used  Substance and Sexual Activity  . Alcohol use: Not Currently  . Drug use: Not Currently    Comment: used 40 yrs ago  . Sexual activity: Not on file  Other Topics  Concern  . Not on file  Social History Narrative   08/15/20 Lives with son   Caffeine- coffee 4 c daily or more   Social Determinants of Health   Financial Resource Strain: Not on file  Food Insecurity: Not on file  Transportation Needs: Not on file  Physical Activity: Not on file  Stress: Not on file  Social Connections: Not on file  Intimate Partner Violence: Not on file     PHYSICAL EXAM  GENERAL EXAM/CONSTITUTIONAL: Vitals:  Vitals:   08/15/20 0808  BP: 136/85  Pulse: 82  Weight: 193 lb 6.4 oz (87.7 kg)  Height: 5' 7"  (1.702 m)   Body mass index is 30.29 kg/m. Wt Readings from Last 3 Encounters:  08/15/20 193 lb 6.4 oz (87.7 kg)  05/10/20 187 lb (84.8 kg)  05/03/20 187 lb (84.8 kg)    Patient is in no distress; well developed, nourished and groomed; neck is supple  CARDIOVASCULAR:  Examination of carotid arteries is normal; no carotid bruits  Regular rate and rhythm, no murmurs  Examination of peripheral vascular system by observation and palpation is normal  EYES:  Ophthalmoscopic exam of optic discs and posterior segments is normal; no papilledema or hemorrhages No exam data present  MUSCULOSKELETAL:  Gait, strength, tone, movements noted in Neurologic exam below  NEUROLOGIC: MENTAL STATUS:  No flowsheet data found.  awake, alert, oriented to person, place and time  recent and remote memory  intact  normal attention and concentration  language fluent, comprehension intact, naming intact  fund of knowledge appropriate  CRANIAL NERVE:   2nd - no papilledema on fundoscopic exam  2nd, 3rd, 4th, 6th - pupils equal and reactive to light, visual fields full to confrontation, extraocular muscles intact, no nystagmus  5th - facial sensation symmetric  7th - facial strength --> symm  8th - hearing intact  9th - palate elevates symmetrically, uvula midline  11th - shoulder shrug symmetric  12th - tongue protrusion midline  MOTOR:  normal bulk and tone, full strength in the BUE, BLE  SENSORY:   normal and symmetric to light touch, temperature, vibration  COORDINATION:   finger-nose-finger, fine finger movements normal  REFLEXES:   deep tendon reflexes TRACE and symmetric  GAIT/STATION:   narrow based gait     DIAGNOSTIC DATA (LABS, IMAGING, TESTING) - I reviewed patient records, labs, notes, testing and imaging myself where available.  Lab Results  Component Value Date   WBC 8.0 05/03/2020   HGB 13.0 05/03/2020   HCT 41.9 05/03/2020   MCV 88.2 05/03/2020   PLT 306 05/03/2020      Component Value Date/Time   NA 140 05/03/2020 0959   K 4.7 05/03/2020 0959   CL 103 05/03/2020 0959   CO2 25 05/03/2020 0959   GLUCOSE 108 (H) 05/03/2020 0959   BUN 16 05/03/2020 0959   CREATININE 1.03 (H) 05/03/2020 0959   CALCIUM 9.4 05/03/2020 0959   PROT 7.8 03/07/2020 2041   ALBUMIN 4.5 03/07/2020 2041   AST 31 03/07/2020 2041   ALT 25 03/07/2020 2041   ALKPHOS 45 03/07/2020 2041   BILITOT 0.5 03/07/2020 2041   GFRNONAA >60 05/03/2020 0959   GFRAA  10/18/2009 0350    >60        The eGFR has been calculated using the MDRD equation. This calculation has not been validated in all clinical situations. eGFR's persistently <60 mL/min signify possible Chronic Kidney Disease.   No results found for: CHOL, HDL, LDLCALC, LDLDIRECT, TRIG,  CHOLHDL Lab Results   Component Value Date   HGBA1C 6.4 (H) 03/08/2020   No results found for: VITAMINB12 No results found for: TSH   11/25/19 CTA head / neck [I reviewed images myself and agree with interpretation. -VRP]  - No acute intracranial abnormality. 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 - Plaque at the proximal right ICA causes less than 50% stenosis.  - Plaque slightly distal to the left ICA origin causes 50% stenosis. - Occluded extracranial right vertebral artery with collateral reconstitution near the skull base. Severe stenosis at the left vertebral origin.    ASSESSMENT AND PLAN  60 y.o. year old female here with:  Dx:  1. Partial symptomatic epilepsy with complex partial seizures, not intractable, without status epilepticus ([place])   2. TIA (transient ischemic attack)      PLAN:  Intermittent episodes of right gaze and head deviation, right arm movements, postictal confusion, suspicious for left frontal lobe seizure; left frontal lobe TIA/stroke may have occurred at the onset.  (Oct 8, 9, 10, 12, 14 --> 2021)  - continue plavix, insulin, atorvastatin, lisinopril  - smoking cessation reviewed  - continue levetiracetam 560m twice a day (plan for long term treatment; could consider to wean off after 3 years seizure free, but would be cautious due to multiple sz cluster in Oct 2021)  - may return to driving (last event 158/94/83  Meds ordered this encounter  Medications  . levETIRAcetam (KEPPRA) 500 MG tablet    Sig: Take 1 tablet (500 mg total) by mouth 2 (two) times daily.    Dispense:  180 tablet    Refill:  4    Return in about 1 year (around 08/15/2021) for with NP (Amy Lomax).  I reviewed images, labs, notes, records myself. I summarized findings and reviewed with patient, for this high risk condition  (seizure / stroke) requiring high complexity decision making.    VPenni Bombard MD 44/[id number] 94:60AM Certified in Neurology, Neurophysiology and Neuroimaging  [facility] [address] [place] [phone] ,

## 2020-08-28 NOTE — Progress Notes (Signed)
ASSESSMENT & PLAN:  60 y.o. female with 5-7 episodic seizures associated with headache and spontaneous movement of head and right upper extremity (02/04/20). Recent history of left carotid subclavian bypass done in [place] for ? Claudication of left upper extremity. ICA stenosis of ~50% seen bilaterally on CTA done 11/25/19 in follow up of LCCA-LSCA bypass. I worry her bypass may be occluded based on physical exam today. She is asymptomatic from this.  Recommend:  Complete cessation from all tobacco products. Blood glucose control with goal A1c < 7%. Blood pressure control with goal blood pressure < 140/90 mmHg. Lipid reduction therapy with goal LDL-C <100 mg/dL (<70 if symptomatic from PAD).  Aspirin 81mg  PO QD.  Clopidogrel 75mg  PO QD. Atorvastatin 40-80mg  PO QD (or other "high intensity" statin therapy).  Check carotid duplex ultrasound to evaluate bypass. Follow up with me in 1 year for surveillance of carotid stenosis with repeat duplex.   CHIEF COMPLAINT:   Episodic seizures  HISTORY:  HISTORY OF PRESENT ILLNESS: Terri Li is a 60 y.o. female with a complicated recent medical history.  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  This was done in [place].  She was in [place] taking care of her mother while she was in the final stages of her life.  She has since returned to [facility] and is looking to establish care with a vascular surgeon.  On February 04, 2020 she began to develop episodic seizure-like activity.  She reports the seizures will occur out of the blue.  She has involuntary movement of her head and right upper extremity.  These are associated with severe headache.  She has been taking high doses of Tylenol daily to try to treat her headache.  She was seen by her primary care physician October 12 who then referred the patient to myself and the neurology team.  08/29/20: patient returns after follow up. She is doing better  from a seizure standpoint. She is having no focal neurologic symptoms. She reports no difficulty using the left arm. No pain in the left hand. No ulcers about the left hand. No vertebrobasilar symptoms.   Past Medical History:  Diagnosis Date  . Cancer ([place])    thyroid  . Carotid artery stenosis   . COPD (chronic obstructive pulmonary disease) ([place])   . Diabetes mellitus without complication ([place])    type 2  . Hyperlipidemia   . Hypertension   . Hypothyroidism   . Insomnia   . Migraine    with a seizue  . Seizures ([place]) 02/04/2020  . Stroke ([place]) 12/2019   H/O  . Thyroid disease    cancer    Past Surgical History:  Procedure Laterality Date  . APPENDECTOMY  1979  . BACK SURGERY  2010   several  . CAROTID ENDARTERECTOMY Left 09/2019  . CHOLECYSTECTOMY  2007  . OOPHORECTOMY Right 2008   one  . REVERSE SHOULDER ARTHROPLASTY Left 05/10/2020   Procedure: REVERSE SHOULDER ARTHROPLASTY AND LEFT SHOULDER OPEN DISTAL CLAVICLE EXCISION;  Surgeon: Hiram Gash, MD;  Location: [facility];  Service: Orthopedics;  Laterality: Left;  LEFT SHOULDER OPEN DISTAL CLAVICLE EXCISION  . SPINAL CORD STIMULATOR IMPLANT  2013, 2017  . TOTAL THYROIDECTOMY  1992   Left carotid subclavian bypass  Family History  Problem Relation Age of Onset  . Heart attack Mother   . Arthritis Mother   . Arthritis Father   . Hypertension Sister   . Lung  cancer Sister   . Hypertension Brother   . Stroke Maternal Grandfather     Social History   Socioeconomic History  . Marital status: Divorced    Spouse name: Not on file  . Number of children: 1  . Years of education: Not on file  . Highest education level: Associate degree: academic program  Occupational History    Comment: [employer]  Tobacco Use  . Smoking status: Current Every Day Smoker    Packs/day: 0.50    Years: 15.00    Pack years: 7.50    Types: Cigarettes  . Smokeless tobacco: Never Used  Vaping Use  . Vaping Use: Never used  Substance and  Sexual Activity  . Alcohol use: Not Currently  . Drug use: Not Currently    Comment: used 40 yrs ago  . Sexual activity: Not on file  Other Topics Concern  . Not on file  Social History Narrative   08/15/20 Lives with son   Caffeine- coffee 4 c daily or more   Social Determinants of Health   Financial Resource Strain: Not on file  Food Insecurity: Not on file  Transportation Needs: Not on file  Physical Activity: Not on file  Stress: Not on file  Social Connections: Not on file  Intimate Partner Violence: Not on file    Allergies  Allergen Reactions  . Codeine Itching  . Hydrocodone Itching    Current Outpatient Medications  Medication Sig Dispense Refill  . amitriptyline (ELAVIL) 10 MG tablet Take 10 mg by mouth at bedtime. (Patient not taking: Reported on 08/15/2020)    . atorvastatin (LIPITOR) 80 MG tablet Take 80 mg by mouth daily.     . celecoxib (CELEBREX) 100 MG capsule Take 100 mg by mouth 2 (two) times daily.    . clopidogrel (PLAVIX) 75 MG tablet Take 75 mg by mouth daily.    . empagliflozin (JARDIANCE) 25 MG TABS tablet Take 25 mg by mouth daily.    Marland Kitchen etodolac (LODINE) 400 MG tablet 1 tablet by mouth twice a day as needed    . fluticasone (FLONASE) 50 MCG/ACT nasal spray Place 1 spray into both nostrils daily as needed for allergies or rhinitis.    . Gabapentin, Once-Daily, (GRALISE) 600 MG TABS Take 1,800 mg by mouth at bedtime.    . levETIRAcetam (KEPPRA) 500 MG tablet Take 1 tablet (500 mg total) by mouth 2 (two) times daily. 180 tablet 4  . levothyroxine (SYNTHROID) 175 MCG tablet Take 175 mcg by mouth daily before breakfast.    . lisinopril (ZESTRIL) 10 MG tablet Take 10 mg by mouth daily.     . metFORMIN (GLUCOPHAGE) 500 MG tablet Take 1,000-1,500 mg by mouth See admin instructions. Take 1500 mg in the morning and 1000 mg at night    . methocarbamol (ROBAXIN) 500 MG tablet Take 1 tablet (500 mg total) by mouth every 8 (eight) hours as needed for muscle spasms.  20 tablet 0  . MOBIC 15 MG tablet 1 tablet by mouth once a day as needed stop etodolac and celebrex (Patient not taking: Reported on 08/15/2020)    . nicotine (NICODERM CQ - DOSED IN MG/24 HOURS) 14 mg/24hr patch Place 1 patch (14 mg total) onto the skin daily. (Patient not taking: No sig reported) 28 patch 0  . NOVOLOG FLEXPEN 100 UNIT/ML FlexPen INJECT 15 UNITS SOLUTION PEN-INJECTOR WITH BREAKFAST AND LUNCH AND 25 WITH DINNER    . Tapentadol HCl (NUCYNTA) 100 MG TABS Take 100 mg by  mouth 2 (two) times daily.    Nelva Nay SOLOSTAR 300 UNIT/ML Solostar Pen Inject 60 Units into the skin at bedtime.     Marland Kitchen zolpidem (AMBIEN) 5 MG tablet Take 5 mg by mouth at bedtime.     No current facility-administered medications for this visit.    REVIEW OF SYSTEMS:  [X]  denotes positive finding, [ ]  denotes negative finding Cardiac  Comments:  Chest pain or chest pressure:    Shortness of breath upon exertion:    Short of breath when lying flat:    Irregular heart rhythm:        Vascular    Pain in calf, thigh, or hip brought on by ambulation:    Pain in feet at night that wakes you up from your sleep:     Blood clot in your veins:    Leg swelling:         Pulmonary    Oxygen at home:    Productive cough:     Wheezing:         Neurologic    Sudden weakness in arms or legs:     Sudden numbness in arms or legs:     Sudden onset of difficulty speaking or slurred speech:    Temporary loss of vision in one eye:     Problems with dizziness:         Gastrointestinal    Blood in stool:     Vomited blood:         Genitourinary    Burning when urinating:     Blood in urine:        Psychiatric    Major depression:         Hematologic    Bleeding problems:    Problems with blood clotting too easily:        Skin    Rashes or ulcers:        Constitutional    Fever or chills:     PHYSICAL EXAM:   Vitals:   08/29/20 0855 08/29/20 0900  BP: 124/74 119/76  Pulse: 81   Resp: 20   Temp: (!)  97.2 F (36.2 C)   SpO2: 100%   Weight: 190 lb (86.2 kg)   Height: 5\' 7"  (1.702 m)     Constitutional: Chronically ill appearing in no distress. Appears well nourished.  Neurologic: Normal gait and station. CN intact.  No weakness.  No sensory loss. Psychiatric: Mood and affect symmetric and appropriate. Eyes: No icterus.  No conjunctival pallor. Ears, nose, throat: mucous membranes moist.  Midline trachea.  No carotid bruit.  Chevron incision over the left neck consistent with carotid subclavian bypass. Cardiac: Regular rate and rhythm.  No murmurs / gallops / rubs. Respiratory: Clear to auscultation bilaterally.  No wheezes / rales / rhonchi. Abdominal: Soft, non-tender, non-distended.  Peripheral vascular:  Absent radial pulses bilaterally - changed from previous Extremity: No edema. No cyanosis. No pallor.  Skin: No gangrene. No ulceration.  Lymphatic: No Stemmer's sign. No palpable lymphadenopathy.  DATA REVIEW:    Most recent CBC CBC Latest Ref Rng & Units 05/03/2020 03/09/2020 03/07/2020  WBC 4.0 - 10.5 K/uL 8.0 5.8 10.7(H)  Hemoglobin 12.0 - 15.0 g/dL 13.0 10.4(L) 12.7  Hematocrit 36.0 - 46.0 % 41.9 32.6(L) 39.9  Platelets 150 - 400 K/uL 306 268 340     Most recent CMP CMP Latest Ref Rng & Units 05/03/2020 03/09/2020 03/07/2020  Glucose 70 - 99 mg/dL 108(H) 104(H) 57(L)  BUN 6 - 20 mg/dL 16 9 16   Creatinine 0.44 - 1.00 mg/dL 1.03(H) 0.83 0.90  Sodium 135 - 145 mmol/L 140 134(L) 136  Potassium 3.5 - 5.1 mmol/L 4.7 3.9 3.7  Chloride 98 - 111 mmol/L 103 96(L) 99  CO2 22 - 32 mmol/L 25 27 25   Calcium 8.9 - 10.3 mg/dL 9.4 8.9 9.3  Total Protein 6.5 - 8.1 g/dL - - 7.8  Total Bilirubin 0.3 - 1.2 mg/dL - - 0.5  Alkaline Phos 38 - 126 U/L - - 45  AST 15 - 41 U/L - - 31  ALT 0 - 44 U/L - - 25    Renal function CrCl cannot be calculated (Patient's most recent lab result is older than the maximum 21 days allowed.).  Hgb A1c MFr Bld (%)  Date Value  03/08/2020 6.4 (H)     No results found for: LDLCALC, LDLC, HIRISKLDL, POCLDL, LDLDIRECT, REALLDLC, TOTLDLC   EXAM: CT ANGIOGRAPHY HEAD AND NECK  TECHNIQUE: Multidetector CT imaging of the head and neck was performed using the standard protocol during bolus administration of intravenous contrast. Multiplanar CT image reconstructions and MIPs were obtained to evaluate the vascular anatomy. Carotid stenosis measurements (when applicable) are obtained utilizing NASCET criteria, using the distal internal carotid diameter as the denominator.  CONTRAST:  164mL OMNIPAQUE IOHEXOL 350 MG/ML SOLN  COMPARISON:  None.  FINDINGS: CT HEAD  Brain: There is no acute intracranial hemorrhage, mass effect, or edema. Gray-white differentiation is preserved. There is no extra-axial fluid collection. Small age-indeterminate infarction of the right cerebellum. Ventricles and sulci are within normal limits in size and configuration.  Vascular: No hyperdense vessel or unexpected calcification.  Skull: Calvarium is unremarkable.  Sinuses/Orbits: No acute finding.  Other: Patchy right mastoid opacification.  Review of the MIP images confirms the above findings  CTA NECK  Aortic arch: There is calcified and noncalcified plaque at the great vessel origins. Severe stenosis of the left subclavian origin with partial reconstitution secondary to retrograde flow via common carotid-subclavian graft. There is at least mild stenosis at the innominate origin. At least mild stenosis at the right subclavian origin. There is suboptimal evaluation due to significant streak artifact.  Right carotid system: Common carotid is patent with multifocal calcified plaque. There is calcified plaque at the bifurcation and along the proximal ICA causing less than 50% stenosis. Remainder of cervical ICA is patent. External carotid is patent.  Left carotid system: Probable moderate stenosis at the common carotid origin.  Atherosclerotic wall thickening along the common carotid with mild calcified plaque also present. Possible carotid endarterectomy. There is noncalcified plaque along the proximal ICA, greatest approximately 1.5 cm from the origin causing approximately 50% stenosis. Remainder of the cervical IC is patent. There is severe stenosis at the ECA origin.  Vertebral arteries: Left vertebral artery is patent with severe stenosis at the origin. Minimal opacification of the right vertebral artery, which is occluded at the origin. There is collateral reconstitution at the level of the V3 segment.  Skeleton: Multilevel degenerative changes of the cervical spine.  Other neck: No mass or adenopathy.  Upper chest: Left apical atelectasis or scarring.  Review of the MIP images confirms the above findings  CTA HEAD  Anterior circulation: Intracranial internal carotid arteries are patent. Anterior cerebral arteries are patent. Right A1 ACA is dominant. Middle cerebral arteries are patent.  Posterior circulation: Intracranial vertebral arteries, basilar artery, and posterior cerebral arteries are patent. There are bilateral posterior communicating arteries.  Venous sinuses:  Patent as allowed by contrast bolus timing.  Review of the MIP images confirms the above findings  IMPRESSION: No acute intracranial abnormality. 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  Plaque at the proximal right ICA causes less than 50% stenosis. Plaque slightly distal to the left ICA origin causes 50% stenosis.  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 Severe stenosis at the left vertebral origin.   Electronically Signed   By: Macy Mis M.D.   On: 11/25/2019  10:01   Yevonne Aline. Stanford Breed, MD [facility] Phone Number: [phone] 08/28/2020 11:44 AM

## 2020-08-29 ENCOUNTER — Encounter: Payer: Self-pay | Admitting: Vascular Surgery

## 2020-08-29 ENCOUNTER — Ambulatory Visit (INDEPENDENT_AMBULATORY_CARE_PROVIDER_SITE_OTHER): Payer: BLUE CROSS/BLUE SHIELD | Admitting: Vascular Surgery

## 2020-08-29 ENCOUNTER — Other Ambulatory Visit: Payer: Self-pay

## 2020-08-29 VITALS — BP 119/76 | HR 81 | Temp 97.2°F | Resp 20 | Ht 67.0 in | Wt 190.0 lb

## 2020-08-29 DIAGNOSIS — I739 Peripheral vascular disease, unspecified: Secondary | ICD-10-CM

## 2020-08-29 MED ORDER — CLOPIDOGREL BISULFATE 75 MG PO TABS: 75.0000 mg | ORAL_TABLET | Freq: Every day | ORAL | 11 refills | Status: DC

## 2020-09-05 ENCOUNTER — Other Ambulatory Visit: Payer: Self-pay

## 2020-09-05 DIAGNOSIS — I6523 Occlusion and stenosis of bilateral carotid arteries: Secondary | ICD-10-CM

## 2020-09-29 ENCOUNTER — Other Ambulatory Visit: Payer: Self-pay

## 2020-09-29 ENCOUNTER — Ambulatory Visit (HOSPITAL_COMMUNITY)
Admission: RE | Admit: 2020-09-29 | Discharge: 2020-09-29 | Disposition: A | Discharge status: Home / Self Care | Payer: BLUE CROSS/BLUE SHIELD | Source: Ambulatory Visit | Attending: Vascular Surgery | Admitting: Vascular Surgery

## 2020-09-29 DIAGNOSIS — I6523 Occlusion and stenosis of bilateral carotid arteries: Secondary | ICD-10-CM | POA: Diagnosis not present

## 2021-04-17 ENCOUNTER — Telehealth: Payer: Self-pay | Admitting: Diagnostic Neuroimaging

## 2021-04-17 ENCOUNTER — Emergency Department (HOSPITAL_COMMUNITY)
Admission: EM | Admit: 2021-04-17 | Discharge: 2021-04-17 | Disposition: A | Discharge status: Home / Self Care | Payer: BLUE CROSS/BLUE SHIELD | Attending: Emergency Medicine | Admitting: Emergency Medicine

## 2021-04-17 ENCOUNTER — Emergency Department (HOSPITAL_COMMUNITY): Payer: BLUE CROSS/BLUE SHIELD

## 2021-04-17 ENCOUNTER — Other Ambulatory Visit: Payer: Self-pay

## 2021-04-17 DIAGNOSIS — I10 Essential (primary) hypertension: Secondary | ICD-10-CM | POA: Insufficient documentation

## 2021-04-17 DIAGNOSIS — F17210 Nicotine dependence, cigarettes, uncomplicated: Secondary | ICD-10-CM | POA: Insufficient documentation

## 2021-04-17 DIAGNOSIS — Z85850 Personal history of malignant neoplasm of thyroid: Secondary | ICD-10-CM | POA: Insufficient documentation

## 2021-04-17 DIAGNOSIS — I639 Cerebral infarction, unspecified: Secondary | ICD-10-CM | POA: Diagnosis not present

## 2021-04-17 DIAGNOSIS — J449 Chronic obstructive pulmonary disease, unspecified: Secondary | ICD-10-CM | POA: Insufficient documentation

## 2021-04-17 DIAGNOSIS — E119 Type 2 diabetes mellitus without complications: Secondary | ICD-10-CM | POA: Insufficient documentation

## 2021-04-17 DIAGNOSIS — Z20822 Contact with and (suspected) exposure to covid-19: Secondary | ICD-10-CM | POA: Insufficient documentation

## 2021-04-17 DIAGNOSIS — Z96611 Presence of right artificial shoulder joint: Secondary | ICD-10-CM | POA: Diagnosis not present

## 2021-04-17 DIAGNOSIS — R531 Weakness: Secondary | ICD-10-CM | POA: Diagnosis not present

## 2021-04-17 DIAGNOSIS — R569 Unspecified convulsions: Secondary | ICD-10-CM | POA: Insufficient documentation

## 2021-04-17 DIAGNOSIS — E039 Hypothyroidism, unspecified: Secondary | ICD-10-CM | POA: Diagnosis not present

## 2021-04-17 LAB — CBC
HCT: 45.5 % (ref 36.0–46.0)
Hemoglobin: 13.9 g/dL (ref 12.0–15.0)
MCH: 28.3 pg (ref 26.0–34.0)
MCHC: 30.5 g/dL (ref 30.0–36.0)
MCV: 92.5 fL (ref 80.0–100.0)
Platelets: 294 10*3/uL (ref 150–400)
RBC: 4.92 MIL/uL (ref 3.87–5.11)
RDW: 14.3 % (ref 11.5–15.5)
WBC: 7.4 10*3/uL (ref 4.0–10.5)
nRBC: 0 % (ref 0.0–0.2)

## 2021-04-17 LAB — COMPREHENSIVE METABOLIC PANEL
ALT: 23 U/L (ref 0–44)
AST: 34 U/L (ref 15–41)
Albumin: 3.5 g/dL (ref 3.5–5.0)
Alkaline Phosphatase: 34 U/L — ABNORMAL LOW (ref 38–126)
Anion gap: 7 (ref 5–15)
BUN: 16 mg/dL (ref 6–20)
CO2: 26 mmol/L (ref 22–32)
Calcium: 8.8 mg/dL — ABNORMAL LOW (ref 8.9–10.3)
Chloride: 105 mmol/L (ref 98–111)
Creatinine, Ser: 0.79 mg/dL (ref 0.44–1.00)
GFR, Estimated: >60 mL/min (ref >60)
Glucose, Bld: 200 mg/dL — ABNORMAL HIGH (ref 70–99)
Potassium: 4.7 mmol/L (ref 3.5–5.1)
Sodium: 138 mmol/L (ref 135–145)
Total Bilirubin: 0.5 mg/dL (ref 0.3–1.2)
Total Protein: 6.4 g/dL — ABNORMAL LOW (ref 6.5–8.1)

## 2021-04-17 LAB — PROTIME-INR
INR: 1 (ref 0.8–1.2)
Prothrombin Time: 12.8 seconds (ref 11.4–15.2)

## 2021-04-17 LAB — RESP PANEL BY RT-PCR (FLU A&B, COVID) ARPGX2
Influenza A by PCR: NEGATIVE
Influenza B by PCR: NEGATIVE
SARS Coronavirus 2 by RT PCR: NEGATIVE

## 2021-04-17 LAB — I-STAT BETA HCG BLOOD, ED (MC, WL, AP ONLY): I-stat hCG, quantitative: <5 m[IU]/mL (ref <5)

## 2021-04-17 LAB — ETHANOL: Alcohol, Ethyl (B): <10 mg/dL (ref <10)

## 2021-04-17 LAB — DIFFERENTIAL
Abs Immature Granulocytes: 0.02 10*3/uL (ref 0.00–0.07)
Basophils Absolute: 0 10*3/uL (ref 0.0–0.1)
Basophils Relative: 1 %
Eosinophils Absolute: 0.1 10*3/uL (ref 0.0–0.5)
Eosinophils Relative: 1 %
Immature Granulocytes: 0 %
Lymphocytes Relative: 22 %
Lymphs Abs: 1.6 10*3/uL (ref 0.7–4.0)
Monocytes Absolute: 0.4 10*3/uL (ref 0.1–1.0)
Monocytes Relative: 5 %
Neutro Abs: 5.3 10*3/uL (ref 1.7–7.7)
Neutrophils Relative %: 71 %

## 2021-04-17 LAB — I-STAT CHEM 8, ED
BUN: 18 mg/dL (ref 6–20)
Calcium, Ion: 1.04 mmol/L — ABNORMAL LOW (ref 1.15–1.40)
Chloride: 104 mmol/L (ref 98–111)
Creatinine, Ser: 0.7 mg/dL (ref 0.44–1.00)
Glucose, Bld: 182 mg/dL — ABNORMAL HIGH (ref 70–99)
HCT: 44 % (ref 36.0–46.0)
Hemoglobin: 15 g/dL (ref 12.0–15.0)
Potassium: 4.7 mmol/L (ref 3.5–5.1)
Sodium: 138 mmol/L (ref 135–145)
TCO2: 25 mmol/L (ref 22–32)

## 2021-04-17 LAB — APTT: aPTT: 30 seconds (ref 24–36)

## 2021-04-17 LAB — CBG MONITORING, ED: Glucose-Capillary: 206 mg/dL — ABNORMAL HIGH (ref 70–99)

## 2021-04-17 MED ORDER — LEVETIRACETAM 750 MG PO TABS: 750.0000 mg | ORAL_TABLET | Freq: Two times a day (BID) | ORAL | 4 refills | Status: DC

## 2021-04-17 NOTE — Telephone Encounter (Signed)
Called patient LVM requesting call back.

## 2021-04-17 NOTE — Consult Note (Signed)
NEUROLOGY CONSULTATION NOTE   Date of service: April 17, 2021 Patient Name: Terri Li MRN:  [id number] DOB:  28-May-1960 Reason for consult: "Stroke code for R sided weakness" Requesting Provider: No att. providers found _ _ _   _ __   _ __ _ _  __ __   _ __   __ _  History of Present Illness  Terri Li is a 60 y.o. female with PMH significant for COPD, DM2, HTN, hypothyroidism, HLD, seizures on LEV who presents with R sided weakness. Went to bed at 2000 and woke up out of nowhere and was rigid and unable to talk or call for her sister. Rigidity resolved and she called out for her sister Terri Li who called EMS. Patient reports that she was weak in BL lower extremities and in her RUE immediately afterwards. EMS found her weak in RUE and RLE and brought her in as a code stroke.  On arrival, RUE much stronger. Patient initially refused CT or MRI, she is worried about her spinal stimulator. Reassured her that it should be safe to get a CTH. I re-examined her after CTH and her R leg weakness has improved with a NIHSS of 1.  She follows with Dr. Leta Baptist for these episodes. Typical episode if headache for 5-10 secs followed by RUE goes up and eyes to right and then resolves with some weakness in RUE and exhausted. She was started on Keppra 515m BID earlier this year and has not had episodes until last night. This one woke her up from her sleep.  mRS: 0 tNKAse: not offered, LKW of 2000, symptoms rapidly improving and unable to get STAT MRI Brain due to spinal stimulator. Thrombectomy: Not offered, NIHSS is low, symptoms are improving and overall low suspicion for LVO. NIHSS components Score: Comment  1a Level of Conscious 0[x] 1[] 2[] 3[]     1b LOC Questions 0[x] 1[] 2[]      1c LOC Commands 0[x] 1[] 2[]      2 Best Gaze 0[x] 1[] 2[]      3 Visual 0[x] 1[] 2[] 3[]     4 Facial Palsy 0[x] 1[] 2[] 3[]     5a Motor Arm - left 0[x] 1[] 2[] 3[] 4[] UN[]   5b Motor Arm - Right 0[x] 1[]  2[] 3[] 4[] UN[]   6a Motor Leg - Left 0[] 1[x] 2[] 3[] 4[] UN[]   6b Motor Leg - Right 0[] 1[x] 2[] 3[] 4[] UN[]   7 Limb Ataxia 0[x] 1[] 2[] 3[] UN[]    8 Sensory 0[] 1[x] 2[] UN[]     9 Best Language 0[x] 1[] 2[] 3[]     10 Dysarthria 0[x] 1[] 2[] UN[]     11 Extinct. and Inattention 0[x] 1[] 2[]      TOTAL: 3     Repeat NIHSS after CTH: NIHSS components Score: Comment  1a Level of Conscious 0[x] 1[] 2[] 3[]     1b LOC Questions 0[x] 1[] 2[]      1c LOC Commands 0[x] 1[] 2[]      2 Best Gaze 0[x] 1[] 2[]      3 Visual 0[x] 1[] 2[] 3[]     4 Facial Palsy 0[x] 1[] 2[] 3[]     5a Motor Arm - left 0[x] 1[] 2[] 3[] 4[] UN[]   5b Motor Arm - Right 0[x] 1[] 2[] 3[] 4[] UN[]   6a Motor Leg - Left 0[x] 1[] 2[]  3[] 4[] UN[]   6b Motor Leg - Right 0[x] 1[] 2[] 3[] 4[] UN[]   7 Limb Ataxia 0[x] 1[] 2[] 3[] UN[]    8 Sensory 0[] 1[x] 2[] UN[]     9 Best Language 0[x] 1[] 2[] 3[]     10 Dysarthria 0[x] 1[] 2[] UN[]     11 Extinct. and Inattention 0[x] 1[] 2[]      TOTAL: 1      ROS   Constitutional Denies weight loss, fever and chills.   HEENT Denies changes in vision and hearing.   Respiratory Denies SOB and cough.   CV Denies palpitations and CP   GI Denies abdominal pain, nausea, vomiting and diarrhea.   GU Denies dysuria and urinary frequency.   MSK Denies myalgia and joint pain.   Skin Denies rash and pruritus.   Neurological Denies headache and syncope.   Psychiatric Denies recent changes in mood. Denies anxiety and depression.    Past History   Past Medical History:  Diagnosis Date   Cancer [facility])    thyroid   Carotid artery stenosis    COPD (chronic obstructive pulmonary disease) ([place])    Diabetes mellitus without complication ([place])    type 2   Hyperlipidemia    Hypertension    Hypothyroidism    Insomnia    Migraine    with a seizue   Seizures ([place]) 02/04/2020   Stroke ([place]) 12/2019   H/O   Thyroid disease    cancer   Past Surgical History:  Procedure  Laterality Date   APPENDECTOMY  1979   BACK SURGERY  2010   several   CAROTID ENDARTERECTOMY Left 09/2019   CHOLECYSTECTOMY  2007   OOPHORECTOMY Right 2008   one   REVERSE SHOULDER ARTHROPLASTY Left 05/10/2020   Procedure: REVERSE SHOULDER ARTHROPLASTY AND LEFT SHOULDER OPEN DISTAL CLAVICLE EXCISION;  Surgeon: Hiram Gash, MD;  Location: [facility];  Service: Orthopedics;  Laterality: Left;  LEFT SHOULDER OPEN DISTAL CLAVICLE EXCISION   SPINAL CORD STIMULATOR IMPLANT  2013, 2017   TOTAL THYROIDECTOMY  1992   Family History  Problem Relation Age of Onset   Heart attack Mother    Arthritis Mother    Arthritis Father    Hypertension Sister    Lung cancer Sister    Hypertension Brother    Stroke Maternal Grandfather    Social History   Socioeconomic History   Marital status: Divorced    Spouse name: Not on file   Number of children: 1   Years of education: Not on file   Highest education level: Associate degree: academic program  Occupational History    Comment: [employer]  Tobacco Use   Smoking status: Every Day    Packs/day: 0.50    Years: 15.00    Pack years: 7.50    Types: Cigarettes   Smokeless tobacco: Never  Vaping Use   Vaping Use: Never used  Substance and Sexual Activity   Alcohol use: Not Currently   Drug use: Not Currently    Comment: used 40 yrs ago   Sexual activity: Not on file  Other Topics Concern   Not on file  Social History Narrative   08/15/20 Lives with son   Caffeine- coffee 4 c daily or more   Social Determinants of Health   Financial Resource Strain: Not on file  Food Insecurity: Not on file  Transportation Needs: Not on file  Physical Activity: Not on file  Stress: Not on file  Social Connections: Not on file   Allergies  Allergen Reactions   Codeine Itching   Hydrocodone Itching    Medications  (Not in a hospital admission)    Vitals   There were no vitals filed for this visit.   There is no height or weight on file to calculate  BMI.  Physical Exam   General: Laying comfortably in bed; in no acute distress.  HENT: Normal oropharynx and mucosa. Normal external appearance of ears and nose.  Neck: Supple, no pain or tenderness  CV: No JVD. No peripheral edema.  Pulmonary: Symmetric Chest rise. Normal respiratory effort.  Abdomen: Soft to touch, non-tender.  Ext: No cyanosis, edema, or deformity  Skin: No rash. Normal palpation of skin.   Musculoskeletal: Normal digits and nails by inspection. No clubbing.   Neurologic Examination  Mental status/Cognition: Alert, oriented to self, place, month and year, good attention.  Speech/language: Fluent, comprehension intact, object naming intact, repetition intact.  Cranial nerves:   CN II Pupils equal and reactive to light, no VF deficits    CN III,IV,VI EOM intact, no gaze preference or deviation, no nystagmus    CN V normal sensation in V1, V2, and V3 segments bilaterally    CN VII no asymmetry, no nasolabial fold flattening    CN VIII normal hearing to speech    CN IX & X normal palatal elevation, no uvular deviation    CN XI 5/5 head turn and 5/5 shoulder shrug bilaterally    CN XII midline tongue protrusion    Motor:  Muscle bulk: normal, tone normal, pronator drift none tremor none Mvmt Root Nerve  Muscle Right Left Comments  SA C5/6 Ax Deltoid 5 5   EF C5/6 Mc Biceps 5 5   EE C6/7/8 Rad Triceps 5 5   WF C6/7 Med FCR     WE C7/8 PIN ECU     F Ab C8/T1 U ADM/FDI 5 5   HF L1/2/3 Fem Illopsoas 4 4   KE L2/3/4 Fem Quad 4 4   DF L4/5 D Peron Tib Ant 5 5   PF S1/2 Tibial Grc/Sol 5 5    Reflexes:  Right Left Comments  Pectoralis      Biceps (C5/6) 2 2   Brachioradialis (C5/6) 2 2    Triceps (C6/7) 2 2    Patellar (L3/4) 2 2    Achilles (S1)      Hoffman      Plantar     Jaw jerk    Sensation:  Light touch Decreased in RLE to touch   Pin prick    Temperature    Vibration   Proprioception    Coordination/Complex Motor:  - Finger to Nose slowed  in RUE compared to LUE - Heel to shin unable to do 2/2 chronic pain but able to tap my hand with her feet. - Rapid alternating movement are slowed in RUE. - Gait: Deferred for patient safety.  Labs   CBC: No results for input(s): WBC, NEUTROABS, HGB, HCT, MCV, PLT in the last 168 hours.  Basic Metabolic Panel:  Lab Results  Component Value Date   NA 140 05/03/2020   K 4.7 05/03/2020   CO2 25 05/03/2020   GLUCOSE 108 (H) 05/03/2020   BUN 16 05/03/2020   CREATININE 1.03 (H) 05/03/2020   CALCIUM 9.4 05/03/2020   GFRNONAA >60 05/03/2020   GFRAA  10/18/2009    >60        The eGFR has been  calculated using the MDRD equation. This calculation has not been validated in all clinical situations. eGFR's persistently <60 mL/min signify possible Chronic Kidney Disease.   Lipid Panel: No results found for: LDLCALC HgbA1c:  Lab Results  Component Value Date   HGBA1C 6.4 (H) 03/08/2020   Urine Drug Screen: No results found for: LABOPIA, COCAINSCRNUR, LABBENZ, AMPHETMU, THCU, LABBARB  Alcohol Level No results found for: [facility]  CT Head without contrast(Personally reviewed): 1. Asymmetric hyperdensity at the right ICA terminus, which is favored to be artifact in the setting of right side weakness. 2. Otherwise stable and negative noncontrast CT appearance of the brain. ASPECTS 10.   Impression   Terri Li is a 60 y.o. female with PMH significant for COPD, DM2, HTN, hypothyroidism, HLD, seizures on LEV who presents with R sided weakness. Went to bed at 2000 and woke up around 0400 on 04/17/21 with rigidity, followed by weakness in BL lower extremities and RUE. EMS found her weak in RUE and RLE and brought her in as a code stroke. Suspect that this was a seizure with resultant R sided todd's paresis. Her weakness is improving spontaneously so would hold off on further evaluation. Her CTH shows a potential asymmetric hyperdensity at the R ICA terminus but this would not explain her Right  sided weakness and I suspect that this is probably an artifact.  Impression: Breakthrough seizure.  Recommendations  - Recommend increasing Keppra to 719m BID. - observe or a couple hours to ensure resolution of the weakness. - [facility]. _____________________________________________________________________  Plan discussed with Dr. BSedonia Smallwith the ED Team.   Thank you for the opportunity to take part in the care of this patient. If you have any further questions, please contact the neurology consultation attending.  Signed,  SClarksville CityPager Number [phone]_ _ _   _ __   _ __ _ _  __ __   _ __   __ _

## 2021-04-17 NOTE — ED Triage Notes (Signed)
BIB [facility] after pt called to report right sided weakness. Per EMS, pt was altered on arrival and unable to move legs BIL. CBG 71, pt given 25 g D10 enroute. Pt A&O on hospital arrival. Pt able to move both BIL LE.   Hx: DM, mini strokes, defib in back, back surgery, HTN

## 2021-04-17 NOTE — ED Provider Notes (Signed)
°  Physical Exam  BP (!) 151/77 (BP Location: Left Arm)    Pulse 79    Temp 98.2 F (36.8 C) (Oral)    Resp 13    SpO2 100%     ED Course/Procedures     Procedures  MDM  60 year old female with a hx of CVA, presenting with stroke like symptoms on awakening today. 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 Cannot undergo MRI imaging due to a spinal cord stimulator.   Patient reassessed bedside, states that she feels much improved and is back to her baseline at this time.  CT code stroke imaging without evidence of stroke and neurology not concern for stroke at this time.  Recommend discharge with outpatient neuro follow-up.       Regan Lemming, MD 04/17/21 [phone]

## 2021-04-17 NOTE — Telephone Encounter (Signed)
Meds ordered this encounter  Medications   levETIRAcetam (KEPPRA) 750 MG tablet    Sig: Take 1 tablet (750 mg total) by mouth 2 (two) times daily.    Dispense:  180 tablet    Refill:  [place], MD 42/37/0230, 17:20 PM Certified in Neurology, Neurophysiology and Neuroimaging  [facility] [address] [place], [place] [address] [phone]

## 2021-04-17 NOTE — Telephone Encounter (Signed)
Pt called, went to the ER for a seizure, was told to schedule an appt with neurologist to increase levETIRAcetam (KEPPRA) 500 MG tablet to 750 mg twice a day soon as possible . Would like a call from the nurse to discuss a sooner appt than February.

## 2021-04-17 NOTE — ED Notes (Signed)
Patient verbalizes understanding of discharge instructions. Opportunity for questioning and answers were provided. Armband removed by staff, pt discharged from ED via wheelchair to lobby to return home with family.  

## 2021-04-17 NOTE — Telephone Encounter (Signed)
Patient returned call. She stated ED MD advised she increase keppra to 750 mg twice a day, but no Rx given. Sister witnessed seizure; She became very rigid, couldn't speak very well. Patient denies illness or missed doses. She stated this seizure was very different from her past seizures. I scheduled  her for FU, advised will discuss keppra dose with Dr Leta Baptist and call her later Patient verbalized understanding, appreciation.

## 2021-04-17 NOTE — Telephone Encounter (Signed)
Called patient and advised her of new Keppra Rx. Patient verbalized understanding, appreciation.

## 2021-04-17 NOTE — Code Documentation (Signed)
Responded to code stroke called by [facility] for R sided weakness. LKW 2000, before went to sleep, woke up with R sided weakness. NIH 1 for decreased sensation R leg. Pt initially refused CTH but after discussion with neurologist, was agreeable. CTH neg for acute findings. No TNK, outside window. Per Dr. Lorrin Goodell, not a stroke.

## 2021-04-17 NOTE — ED Provider Notes (Signed)
[facility] Emergency Department Provider Note MRN:  [id number]  Arrival date & time: 04/17/21     Chief Complaint   Code stroke History of Present Illness   Terri Li is a 60 y.o. year-old female with a history of COPD, diabetes, stroke, seizures presenting to the ED with chief complaint of code stroke.  Went to bed at 8:30 PM, which is her last known normal.  Woke up and noticed right-sided weakness.  There is also EMS report of a focal seizure involving the right arm.  The right arm weakness is resolved but patient continues to have right leg weakness and decreased sensation.  No speech changes.  Review of Systems  A complete 10 system review of systems was obtained and all systems are negative except as noted in the HPI and PMH.   Patient's Health History    Past Medical History:  Diagnosis Date   Cancer [facility])    thyroid   Carotid artery stenosis    COPD (chronic obstructive pulmonary disease) ([place])    Diabetes mellitus without complication ([place])    type 2   Hyperlipidemia    Hypertension    Hypothyroidism    Insomnia    Migraine    with a seizue   Seizures ([place]) 02/04/2020   Stroke ([place]) 12/2019   H/O   Thyroid disease    cancer    Past Surgical History:  Procedure Laterality Date   APPENDECTOMY  1979   BACK SURGERY  2010   several   CAROTID ENDARTERECTOMY Left 09/2019   CHOLECYSTECTOMY  2007   OOPHORECTOMY Right 2008   one   REVERSE SHOULDER ARTHROPLASTY Left 05/10/2020   Procedure: REVERSE SHOULDER ARTHROPLASTY AND LEFT SHOULDER OPEN DISTAL CLAVICLE EXCISION;  Surgeon: Hiram Gash, MD;  Location: [facility];  Service: Orthopedics;  Laterality: Left;  LEFT SHOULDER OPEN DISTAL CLAVICLE EXCISION   SPINAL CORD STIMULATOR IMPLANT  2013, 2017   TOTAL THYROIDECTOMY  1992    Family History  Problem Relation Age of Onset   Heart attack Mother    Arthritis Mother    Arthritis Father    Hypertension Sister    Lung cancer Sister     Hypertension Brother    Stroke Maternal Grandfather     Social History   Socioeconomic History   Marital status: Divorced    Spouse name: Not on file   Number of children: 1   Years of education: Not on file   Highest education level: Associate degree: academic program  Occupational History    Comment: [employer]  Tobacco Use   Smoking status: Every Day    Packs/day: 0.50    Years: 15.00    Pack years: 7.50    Types: Cigarettes   Smokeless tobacco: Never  Vaping Use   Vaping Use: Never used  Substance and Sexual Activity   Alcohol use: Not Currently   Drug use: Not Currently    Comment: used 40 yrs ago   Sexual activity: Not on file  Other Topics Concern   Not on file  Social History Narrative   08/15/20 Lives with son   Caffeine- coffee 4 c daily or more   Social Determinants of Health   Financial Resource Strain: Not on file  Food Insecurity: Not on file  Transportation Needs: Not on file  Physical Activity: Not on file  Stress: Not on file  Social Connections: Not on file  Intimate Partner Violence: Not on file  Physical Exam   Vitals:   04/17/21 0547  BP: (!) 151/77  Pulse: 79  Resp: 13  Temp: 98.2 F (36.8 C)  SpO2: 100%    CONSTITUTIONAL: Well-appearing, NAD NEURO:  Alert and oriented x 3, mild decreased sensation and decreased strength to the right lower extremity EYES:  eyes equal and reactive ENT/NECK:  no LAD, no JVD CARDIO: Regular rate, well-perfused, normal S1 and S2 PULM:  CTAB no wheezing or rhonchi GI/GU:  normal bowel sounds, non-distended, non-tender MSK/SPINE:  No gross deformities, no edema SKIN:  no rash, atraumatic PSYCH:  Appropriate speech and behavior  *Additional and/or pertinent findings included in MDM below  Diagnostic and Interventional Summary    EKG Interpretation  Date/Time:  Tuesday April 17 2021 05:47:25 EST Ventricular Rate:  69 PR Interval:  208 QRS Duration: 97 QT Interval:  386 QTC Calculation: 414 R  Axis:   15 Text Interpretation: Sinus rhythm Borderline prolonged PR interval Probable left atrial enlargement Anterior infarct, old Minimal ST elevation, inferior leads Confirmed by Gerlene Fee [phone]) on 04/17/2021 6:36:22 AM       Labs Reviewed  COMPREHENSIVE METABOLIC PANEL - Abnormal; Notable for the following components:      Result Value   Glucose, Bld 200 (*)    Calcium 8.8 (*)    Total Protein 6.4 (*)    Alkaline Phosphatase 34 (*)    All other components within normal limits  I-STAT CHEM 8, ED - Abnormal; Notable for the following components:   Glucose, Bld 182 (*)    Calcium, Ion 1.04 (*)    All other components within normal limits  CBG MONITORING, ED - Abnormal; Notable for the following components:   Glucose-Capillary 206 (*)    All other components within normal limits  RESP PANEL BY RT-PCR (FLU A&B, COVID) ARPGX2  ETHANOL  PROTIME-INR  APTT  CBC  DIFFERENTIAL  RAPID URINE DRUG SCREEN, HOSP PERFORMED  URINALYSIS, ROUTINE W REFLEX MICROSCOPIC  I-STAT BETA HCG BLOOD, ED ([facility], [facility], [facility] ONLY)    CT HEAD CODE STROKE WO CONTRAST  Final Result      Medications - No data to display   Procedures  /  Critical Care .Critical Care Performed by: Maudie Flakes, MD Authorized by: Maudie Flakes, MD   Critical care provider statement:    Critical care time (minutes):  32   Critical care was necessary to treat or prevent imminent or life-threatening deterioration of the following conditions: Concern for acute ischemic stroke, code stroke protocol.   Critical care was time spent personally by me on the following activities:  Development of treatment plan with patient or surrogate, discussions with consultants, evaluation of patient's response to treatment, examination of patient, ordering and review of laboratory studies, ordering and review of radiographic studies, ordering and performing treatments and interventions, pulse oximetry, re-evaluation of patient's condition  and review of old charts  ED Course and Medical Decision Making  I have reviewed the triage vital signs, the nursing notes, and pertinent available records from the EMR.  Listed above are laboratory and imaging tests that I personally ordered, reviewed, and interpreted and then considered in my medical decision making (see below for details).  Question stroke versus seizure and component of Todd's paralysis.  Patient with spinal stimulator and unable to obtain MRIs.  Awaiting CT and CTA of the head and neck.  If not improving may need admission for observation and possible repeat CT imaging in 24 hours.  Neurology favoring seizure related symptoms, plan is to observe for 2 hours and see if symptoms resolve.  If so patient would be appropriate for discharge.  If persistent symptoms, would admit.  Signed out to oncoming provider at shift change.  Barth Kirks. Sedonia Small, [place] [email or web address]  Final Clinical Impressions(s) / ED Diagnoses     ICD-10-CM   1. Seizure ([place])  R56.9     2. Weakness  R53.1       ED Discharge Orders     None        Discharge Instructions Discussed with and Provided to Patient:   Discharge Instructions   None       Maudie Flakes, MD 04/17/21 [phone]

## 2021-05-02 ENCOUNTER — Other Ambulatory Visit: Payer: Self-pay

## 2021-05-02 ENCOUNTER — Encounter: Payer: Self-pay | Admitting: *Deleted

## 2021-05-02 ENCOUNTER — Encounter: Payer: Self-pay | Admitting: Diagnostic Neuroimaging

## 2021-05-02 ENCOUNTER — Ambulatory Visit: Payer: BLUE CROSS/BLUE SHIELD | Admitting: Diagnostic Neuroimaging

## 2021-05-02 VITALS — BP 102/64 | HR 90 | Ht 66.0 in | Wt 161.0 lb

## 2021-05-02 DIAGNOSIS — G40209 Localization-related (focal) (partial) symptomatic epilepsy and epileptic syndromes with complex partial seizures, not intractable, without status epilepticus: Secondary | ICD-10-CM

## 2021-05-02 NOTE — Patient Instructions (Signed)
-   breakthrough seizure; no trigger; recommend to stop driving x 6 months seizure free (last event 04/17/21)

## 2021-05-02 NOTE — Progress Notes (Signed)
[facility]  PATIENT: Terri Li DOB: 07-29-1960  REFERRING CLINICIAN: O'Buch, Greta, PA-C HISTORY FROM: patient  REASON FOR VISIT: follow up   HISTORICAL  CHIEF COMPLAINT:  Chief Complaint  Patient presents with   Follow-up    RM 7 alone here for f/u on seizures. Reports on 04/17/21 she was taken to the ED for seizure like event. Reports she has been feeling weak and foggy headed since her trip to the ED.    HISTORY OF PRESENT ILLNESS:   UPDATE (05/02/21, VRP): Since last visit, had episode of speech arrest with whole body stiffness (7-55mnutes). Went to ER. Had some RUE and RLE weakness, which gradually improved. Dx'd with possible breakthrough seizure.  LEV increased to 7576mtwice a day. Doing well now.    UPDATE (08/15/20, VRP): Since last visit, doing well. Symptoms are resolved. No more seizures. Tolerating LEV. Some mental slowing with LEV.    PRIOR HPI: 61ear old female here for evaluation of abnormal spells.  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  02/04/2020 patient had episode at home where her head and eyes were deviated to the right side, right arm raised up into the air with abnormal movements, lasted for few minutes.  Patient had some fatigue and confusion afterwards.  She was having trouble talking.  She had 5-7 episodes at home on that day.  Her sister came home and witnessed 1 of these events and called 911.  Patient was taken the hospital.  By that time symptoms had resolved and patient was mainly complaining of headaches.  She had a CT scan of the head and headache evaluation.  She was discharged home.  Patient has ongoing similar episodes of right gaze and head deviation, right arm movements, confusion and speech difficulty on October 9, 10th, 12th, 14th.  Following this no further events.  Patient felt  tired and confused afterwards.  Patient continues to have intermittent headaches.  On 03/07/2020 patient slipped and fell down some steps resulting in multiple C-spine, T-spine, L-spine, rib and left clavicle fractures.  She denies any seizure-like or strokelike activity at the onset of the fall and states that this was an accidental slip.  No prior history of seizures.   REVIEW OF SYSTEMS: Full 14 system review of systems performed and negative with exception of: As per HPI.  ALLERGIES: Allergies  Allergen Reactions   Codeine Itching   Hydrocodone Itching    HOME MEDICATIONS: Outpatient Medications Prior to Visit  Medication Sig Dispense Refill   atorvastatin (LIPITOR) 80 MG tablet Take 80 mg by mouth daily.      clopidogrel (PLAVIX) 75 MG tablet Take 1 tablet (75 mg total) by mouth daily. 30 tablet 11   empagliflozin (JARDIANCE) 25 MG TABS tablet Take 25 mg by mouth daily.     fluticasone (FLONASE) 50 MCG/ACT nasal spray Place 1 spray into both nostrils daily as needed for allergies or rhinitis.     Gabapentin, Once-Daily, (GRALISE) 600 MG TABS Take 1,800 mg by mouth at bedtime.     levETIRAcetam (KEPPRA) 750 MG tablet Take 1 tablet (750 mg total) by mouth 2 (two) times daily. 180 tablet 4   levothyroxine (SYNTHROID) 175 MCG tablet Take 175 mcg by mouth daily before breakfast.     lisinopril (ZESTRIL) 10 MG tablet Take 10 mg by mouth daily.      metFORMIN (GLUCOPHAGE) 500 MG tablet Take 1,000-1,500  mg by mouth See admin instructions. Take 1500 mg in the morning and 1000 mg at night     methocarbamol (ROBAXIN) 500 MG tablet Take 1 tablet (500 mg total) by mouth every 8 (eight) hours as needed for muscle spasms. 20 tablet 0   nicotine (NICODERM CQ - DOSED IN MG/24 HOURS) 14 mg/24hr patch Place 1 patch (14 mg total) onto the skin daily. 28 patch 0   NOVOLOG FLEXPEN 100 UNIT/ML FlexPen INJECT 15 UNITS SOLUTION PEN-INJECTOR WITH BREAKFAST AND LUNCH AND 25 WITH DINNER     Tapentadol HCl  (NUCYNTA) 100 MG TABS Take 100 mg by mouth 2 (two) times daily.     TOUJEO SOLOSTAR 300 UNIT/ML Solostar Pen Inject 60 Units into the skin at bedtime.      venlafaxine (EFFEXOR) 37.5 MG tablet Take 37.5 mg by mouth at bedtime.     amitriptyline (ELAVIL) 10 MG tablet Take 10 mg by mouth at bedtime. (Patient not taking: Reported on 05/02/2021)     celecoxib (CELEBREX) 100 MG capsule Take 100 mg by mouth 2 (two) times daily. (Patient not taking: Reported on 05/02/2021)     etodolac (LODINE) 400 MG tablet  (Patient not taking: Reported on 05/02/2021)     MOBIC 15 MG tablet 1 tablet by mouth once a day as needed stop etodolac and celebrex (Patient not taking: Reported on 05/02/2021)     ondansetron (ZOFRAN) 4 MG tablet TAKE 1 TABLET BY MOUTH EVERY 8 (EIGHT) HOURS AS NEEDED FOR UP TO 7 DAYS FOR NAUSEA OR VOMITING. (Patient not taking: Reported on 05/02/2021)     prochlorperazine (COMPAZINE) 5 MG tablet TAKE 1 TABLET BY MOUTH EVERY 8 HOURS AS NEEDED NAUSEA OR VOMITING (Patient not taking: Reported on 05/02/2021)     zolpidem (AMBIEN) 5 MG tablet Take 5 mg by mouth at bedtime. (Patient not taking: Reported on 05/02/2021)     No facility-administered medications prior to visit.    PAST MEDICAL HISTORY: Past Medical History:  Diagnosis Date   Cancer [facility])    thyroid   Carotid artery stenosis    COPD (chronic obstructive pulmonary disease) ([place])    Diabetes mellitus without complication ([place])    type 2   Hyperlipidemia    Hypertension    Hypothyroidism    Insomnia    Migraine    with a seizue   Seizures ([place]) 02/04/2020   Stroke ([place]) 12/2019   H/O   Thyroid disease    cancer    PAST SURGICAL HISTORY: Past Surgical History:  Procedure Laterality Date   APPENDECTOMY  1979   BACK SURGERY  2010   several   CAROTID ENDARTERECTOMY Left 09/2019   CHOLECYSTECTOMY  2007   OOPHORECTOMY Right 2008   one   REVERSE SHOULDER ARTHROPLASTY Left 05/10/2020   Procedure: REVERSE SHOULDER ARTHROPLASTY AND LEFT  SHOULDER OPEN DISTAL CLAVICLE EXCISION;  Surgeon: Hiram Gash, MD;  Location: [facility];  Service: Orthopedics;  Laterality: Left;  LEFT SHOULDER OPEN DISTAL CLAVICLE EXCISION   SPINAL CORD STIMULATOR IMPLANT  2013, 2017   TOTAL THYROIDECTOMY  1992    FAMILY HISTORY: Family History  Problem Relation Age of Onset   Heart attack Mother    Arthritis Mother    Arthritis Father    Hypertension Sister    Lung cancer Sister    Hypertension Brother    Stroke Maternal Grandfather     SOCIAL HISTORY: Social History   Socioeconomic History   Marital status: Divorced    Spouse  name: Not on file   Number of children: 1   Years of education: Not on file   Highest education level: Associate degree: academic program  Occupational History    Comment: [employer]  Tobacco Use   Smoking status: Every Day    Packs/day: 0.50    Years: 15.00    Pack years: 7.50    Types: Cigarettes   Smokeless tobacco: Never  Vaping Use   Vaping Use: Never used  Substance and Sexual Activity   Alcohol use: Not Currently   Drug use: Not Currently    Comment: used 40 yrs ago   Sexual activity: Not on file  Other Topics Concern   Not on file  Social History Narrative   08/15/20 Lives with son   Caffeine- coffee 4 c daily or more   Social Determinants of Health   Financial Resource Strain: Not on file  Food Insecurity: Not on file  Transportation Needs: Not on file  Physical Activity: Not on file  Stress: Not on file  Social Connections: Not on file  Intimate Partner Violence: Not on file     PHYSICAL EXAM  GENERAL EXAM/CONSTITUTIONAL: Vitals:  Vitals:   05/02/21 0915  BP: 102/64  Pulse: 90  SpO2: 95%  Weight: 161 lb (73 kg)  Height: _0  (1.676 m)   Body mass index is 25.99 kg/m. Wt Readings from Last 3 Encounters:  05/02/21 161 lb (73 kg)  08/29/20 190 lb (86.2 kg)  08/15/20 193 lb 6.4 oz (87.7 kg)   Patient is in no distress; well developed, nourished and groomed; neck is  supple  CARDIOVASCULAR: Examination of carotid arteries is normal; no carotid bruits Regular rate and rhythm, no murmurs Examination of peripheral vascular system by observation and palpation is normal  EYES: Ophthalmoscopic exam of optic discs and posterior segments is normal; no papilledema or hemorrhages No results found.  MUSCULOSKELETAL: Gait, strength, tone, movements noted in Neurologic exam below  NEUROLOGIC: MENTAL STATUS:  No flowsheet data found. awake, alert, oriented to person, place and time recent and remote memory intact normal attention and concentration language fluent, comprehension intact, naming intact fund of knowledge appropriate  CRANIAL NERVE:  2nd - no papilledema on fundoscopic exam 2nd, 3rd, 4th, 6th - pupils equal and reactive to light, visual fields full to confrontation, extraocular muscles intact, no nystagmus 5th - facial sensation symmetric 7th - facial strength --> symm 8th - hearing intact 9th - palate elevates symmetrically, uvula midline 11th - shoulder shrug symmetric 12th - tongue protrusion midline  MOTOR:  normal bulk and tone, full strength in the BUE, BLE  SENSORY:  normal and symmetric to light touch, temperature, vibration  COORDINATION:  finger-nose-finger, fine finger movements normal  REFLEXES:  deep tendon reflexes TRACE and symmetric  GAIT/STATION:  narrow based gait     DIAGNOSTIC DATA (LABS, IMAGING, TESTING) - I reviewed patient records, labs, notes, testing and imaging myself where available.  Lab Results  Component Value Date   WBC 7.4 04/17/2021   HGB 15.0 04/17/2021   HCT 44.0 04/17/2021   MCV 92.5 04/17/2021   PLT 294 04/17/2021      Component Value Date/Time   NA 138 04/17/2021 0530   K 4.7 04/17/2021 0530   CL 104 04/17/2021 0530   CO2 26 04/17/2021 0523   GLUCOSE 182 (H) 04/17/2021 0530   BUN 18 04/17/2021 0530   CREATININE 0.70 04/17/2021 0530   CALCIUM 8.8 (L) 04/17/2021 0523   PROT  6.4 (L) 04/17/2021  0523   ALBUMIN 3.5 04/17/2021 0523   AST 34 04/17/2021 0523   ALT 23 04/17/2021 0523   ALKPHOS 34 (L) 04/17/2021 0523   BILITOT 0.5 04/17/2021 0523   GFRNONAA >60 04/17/2021 0523   GFRAA  10/18/2009 0350    >60        The eGFR has been calculated using the MDRD equation. This calculation has not been validated in all clinical situations. eGFR's persistently <60 mL/min signify possible Chronic Kidney Disease.   No results found for: CHOL, HDL, LDLCALC, LDLDIRECT, TRIG, CHOLHDL Lab Results  Component Value Date   HGBA1C 6.4 (H) 03/08/2020   No results found for: VITAMINB12 No results found for: TSH   11/25/19 CTA head / neck [I reviewed images myself and agree with interpretation. -VRP]  - No acute intracranial abnormality. 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 - Plaque at the proximal right ICA causes less than 50% stenosis.  - Plaque slightly distal to the left ICA origin causes 50% stenosis. - Occluded extracranial right vertebral artery with collateral reconstitution near the skull base. Severe stenosis at the left vertebral origin.  04/17/21 CT head 1. Asymmetric hyperdensity at the right ICA terminus, which is favored to be artifact in the setting of right side weakness. 2. Otherwise stable and negative noncontrast CT appearance of the brain. ASPECTS 10. 3. These results were communicated to Dr. Lorrin Goodell at 5:41 am on 04/17/2021 by text page via the [facility] messaging system.    ASSESSMENT AND PLAN  61 y.o. year old female here with:  Dx:  No diagnosis found.    PLAN:  Intermittent episodes of right gaze and head deviation, right arm movements, postictal confusion, suspicious for left frontal lobe seizure; left frontal lobe TIA/stroke may have occurred at the onset.  (Oct (8,  9, 10, 12, 14), 2021; Dec 2022)  - continue plavix, insulin, atorvastatin, lisinopril  - smoking cessation reviewed  - continue levetiracetam 745m twice a day (plan for long term treatment; could consider to wean off after 3 years seizure free, but would be cautious due to multiple sz cluster in Oct 2021)  - breakthrough seizure; no trigger; recommend to stop driving x 6 months seizure free (last event 04/17/21)  Return in about 6 months (around 10/30/2021) for with NP (Amy Lomax).  I reviewed images, labs, notes, records myself. I summarized findings and reviewed with patient, for this high risk condition (seizure) requiring high complexity decision making.    VPenni Bombard MD 17/09/8113 97:26AM Certified in Neurology, Neurophysiology and Neuroimaging  [facility] [address] [place] [phone] ,

## 2021-08-15 ENCOUNTER — Ambulatory Visit: Payer: BLUE CROSS/BLUE SHIELD | Admitting: Family Medicine

## 2021-09-02 ENCOUNTER — Other Ambulatory Visit: Payer: Self-pay | Admitting: Vascular Surgery

## 2021-09-05 ENCOUNTER — Other Ambulatory Visit: Payer: Self-pay | Admitting: *Deleted

## 2021-09-05 DIAGNOSIS — I6523 Occlusion and stenosis of bilateral carotid arteries: Secondary | ICD-10-CM

## 2021-09-20 NOTE — Progress Notes (Unsigned)
ASSESSMENT & PLAN:  61 y.o. female with complex left cerebrovascular disease. History of left carotid subclavian bypass done in [place] for ? Claudication of left upper extremity. Known left internal carotid artery stenosis has progressed to 80-99%. She has bilateral vertebral artery occlusions.   Recommend:  Complete cessation from all tobacco products. Blood glucose control with goal A1c < 7%. Blood pressure control with goal blood pressure < 140/90 mmHg. Lipid reduction therapy with goal LDL-C <100 mg/dL (<70 if symptomatic from PAD).  Aspirin '81mg'$  PO QD.  Clopidogrel '75mg'$  PO QD. Atorvastatin 40-'80mg'$  PO QD (or other "high intensity" statin therapy).  The patient continues to smoke.  Reoperative intervention would carry a higher risk than typical of stroke, cranial nerve injury, bleeding.  For both these reasons, I have counseled her to avoid asymptomatic intervention on this left internal carotid artery stenosis.  If the patient becomes symptomatic, an intervention would be necessary.  A TCAR would be ideal, but I am not sure she is a candidate for this.  Regardless, we both agree that should she suffer a stroke or mini stroke and this internal carotid artery lesion is thought to be the cause, we should pursue an intervention.  CHIEF COMPLAINT:   Episodic seizures  HISTORY:  HISTORY OF PRESENT ILLNESS: Terri Li is a 61 y.o. female with a complicated recent medical history.  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  This was done in [place].  She was in [place] taking care of her mother while she was in the final stages of her life.  She has since returned to [facility] and is looking to establish care with a vascular surgeon.  On February 04, 2020 she began to develop episodic seizure-like activity.  She reports the seizures will occur out of the blue.  She has involuntary movement of her head and right upper extremity.  These are  associated with severe headache.  She has been taking high doses of Tylenol daily to try to treat her headache.  She was seen by her primary care physician October 12 who then referred the patient to myself and the neurology team.  08/29/20: patient returns after follow up. She is doing better from a seizure standpoint. She is having no focal neurologic symptoms. She reports no difficulty using the left arm. No pain in the left hand. No ulcers about the left hand. No vertebrobasilar symptoms.   09/24/21: Doing well.  No new focal neurologic symptoms.  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  No symptoms in the left upper extremity.  Past Medical History:  Diagnosis Date   Cancer [facility])    thyroid   Carotid artery stenosis    COPD (chronic obstructive pulmonary disease) ([place])    Diabetes mellitus without complication ([place])    type 2   Hyperlipidemia    Hypertension    Hypothyroidism    Insomnia    Migraine    with a seizue   Seizures ([place]) 02/04/2020   Stroke ([place]) 12/2019   H/O   Thyroid disease    cancer    Past Surgical History:  Procedure Laterality Date   APPENDECTOMY  1979   BACK SURGERY  2010   several   CAROTID ENDARTERECTOMY Left 09/2019   CHOLECYSTECTOMY  2007   OOPHORECTOMY Right 2008   one   REVERSE SHOULDER ARTHROPLASTY Left 05/10/2020   Procedure: REVERSE SHOULDER ARTHROPLASTY AND LEFT SHOULDER OPEN DISTAL CLAVICLE EXCISION;  Surgeon: Hiram Gash, MD;  Location: [facility];  Service: Orthopedics;  Laterality: Left;  LEFT SHOULDER OPEN DISTAL CLAVICLE EXCISION   SPINAL CORD STIMULATOR IMPLANT  2013, 2017   TOTAL THYROIDECTOMY  1992   Left carotid subclavian bypass  Family History  Problem Relation Age of Onset   Heart attack Mother    Arthritis Mother    Arthritis Father    Hypertension Sister    Lung cancer Sister    Hypertension Brother    Stroke Maternal Grandfather     Social History   Socioeconomic History   Marital status:  Divorced    Spouse name: Not on file   Number of children: 1   Years of education: Not on file   Highest education level: Associate degree: academic program  Occupational History    Comment: [employer]  Tobacco Use   Smoking status: Every Day    Packs/day: 1.00    Years: 15.00    Pack years: 15.00    Types: Cigarettes   Smokeless tobacco: Never  Vaping Use   Vaping Use: Never used  Substance and Sexual Activity   Alcohol use: Not Currently   Drug use: Not Currently    Comment: used 40 yrs ago   Sexual activity: Not on file  Other Topics Concern   Not on file  Social History Narrative   08/15/20 Lives with son   Caffeine- coffee 4 c daily or more   Social Determinants of Health   Financial Resource Strain: Not on file  Food Insecurity: Not on file  Transportation Needs: Not on file  Physical Activity: Not on file  Stress: Not on file  Social Connections: Not on file  Intimate Partner Violence: Not on file    Allergies  Allergen Reactions   Codeine Itching   Hydrocodone Itching    Current Outpatient Medications  Medication Sig Dispense Refill   albuterol (VENTOLIN HFA) 108 (90 Base) MCG/ACT inhaler Inhale 2 puffs into the lungs every 4 (four) hours as needed.     atorvastatin (LIPITOR) 80 MG tablet Take 80 mg by mouth daily.      clopidogrel (PLAVIX) 75 MG tablet TAKE 1 TABLET BY MOUTH EVERY DAY 90 tablet 3   empagliflozin (JARDIANCE) 25 MG TABS tablet Take 25 mg by mouth daily.     fluticasone (FLONASE) 50 MCG/ACT nasal spray Place 1 spray into both nostrils daily as needed for allergies or rhinitis.     Gabapentin, Once-Daily, (GRALISE) 600 MG TABS Take 1,800 mg by mouth at bedtime.     levETIRAcetam (KEPPRA) 750 MG tablet Take 1 tablet (750 mg total) by mouth 2 (two) times daily. 180 tablet 4   levothyroxine (SYNTHROID) 150 MCG tablet Take 150 mcg by mouth daily.     lisinopril (ZESTRIL) 10 MG tablet Take 10 mg by mouth daily.      metFORMIN (GLUCOPHAGE) 500 MG tablet  Take 1,000-1,500 mg by mouth See admin instructions. Take 1500 mg in the morning and 1000 mg at night     methocarbamol (ROBAXIN) 500 MG tablet Take 1 tablet (500 mg total) by mouth every 8 (eight) hours as needed for muscle spasms. 20 tablet 0   NOVOLOG FLEXPEN 100 UNIT/ML FlexPen INJECT 15 UNITS SOLUTION PEN-INJECTOR WITH BREAKFAST AND LUNCH AND 25 WITH DINNER     Tapentadol HCl (NUCYNTA) 100 MG TABS Take 100 mg by mouth 2 (two) times daily.     TOUJEO SOLOSTAR 300 UNIT/ML Solostar Pen Inject 60 Units into the  skin at bedtime.      venlafaxine (EFFEXOR) 37.5 MG tablet Take 37.5 mg by mouth at bedtime.     nicotine (NICODERM CQ - DOSED IN MG/24 HOURS) 14 mg/24hr patch Place 1 patch (14 mg total) onto the skin daily. (Patient not taking: Reported on 09/25/2021) 28 patch 0   No current facility-administered medications for this visit.   PHYSICAL EXAM:   Vitals:   09/25/21 0839  BP: 124/84  Pulse: 86  Resp: 20  Temp: 97.7 F (36.5 C)  SpO2: 98%  Weight: 170 lb (77.1 kg)  Height: '5\' 6"'$  (1.676 m)     Constitutional: Chronically ill appearing in no distress. Appears well nourished.  Neurologic: Normal gait and station. CN intact.  No weakness.  No sensory loss. Psychiatric: Mood and affect symmetric and appropriate. Eyes: No icterus.  No conjunctival pallor. Ears, nose, throat: mucous membranes moist.  Midline trachea.  No carotid bruit.  Chevron incision over the left neck consistent with carotid subclavian bypass. Cardiac: Regular rate and rhythm.  No murmurs / gallops / rubs. Respiratory: Clear to auscultation bilaterally.  No wheezes / rales / rhonchi. Abdominal: Soft, non-tender, non-distended.  Peripheral vascular:  2+ radial pulses  Heavily scarred L neck Extremity: No edema. No cyanosis. No pallor.  Skin: No gangrene. No ulceration.  Lymphatic: No Stemmer's sign. No palpable lymphadenopathy.  DATA REVIEW:    Most recent CBC    Latest Ref Rng & Units 04/17/2021    5:30  AM 04/17/2021    5:23 AM 05/03/2020    9:59 AM  CBC  WBC 4.0 - 10.5 K/uL  7.4   8.0    Hemoglobin 12.0 - 15.0 g/dL 15.0   13.9   13.0    Hematocrit 36.0 - 46.0 % 44.0   45.5   41.9    Platelets 150 - 400 K/uL  294   306       Most recent CMP    Latest Ref Rng & Units 04/17/2021    5:30 AM 04/17/2021    5:23 AM 05/03/2020    9:59 AM  CMP  Glucose 70 - 99 mg/dL 182   200   108    BUN 6 - 20 mg/dL '18   16   16    '$ Creatinine 0.44 - 1.00 mg/dL 0.70   0.79   1.03    Sodium 135 - 145 mmol/L 138   138   140    Potassium 3.5 - 5.1 mmol/L 4.7   4.7   4.7    Chloride 98 - 111 mmol/L 104   105   103    CO2 22 - 32 mmol/L  26   25    Calcium 8.9 - 10.3 mg/dL  8.8   9.4    Total Protein 6.5 - 8.1 g/dL  6.4     Total Bilirubin 0.3 - 1.2 mg/dL  0.5     Alkaline Phos 38 - 126 U/L  34     AST 15 - 41 U/L  34     ALT 0 - 44 U/L  23       Renal function CrCl cannot be calculated (Patient's most recent lab result is older than the maximum 21 days allowed.).  Hgb A1c MFr Bld (%)  Date Value  03/08/2020 6.4 (H)   Carotid duplex 09/25/21: Right Carotid: Velocities in the right ICA are consistent with a 40-59%  stenosis.   Left Carotid: Velocities in the left ICA are consistent with a 80-99%  stenosis.                Patent subclavian to CCA bypass.   Vertebrals:  Bilateral vertebral arteries demonstrate antegrade flow.  Subclavians: Normal flow hemodynamics were seen in bilateral subclavian               arteries.   Yevonne Aline. Stanford Breed, MD [facility] Phone Number: [phone] 09/25/2021 9:00 AM

## 2021-09-25 ENCOUNTER — Ambulatory Visit (HOSPITAL_COMMUNITY)
Admission: RE | Admit: 2021-09-25 | Discharge: 2021-09-25 | Disposition: A | Discharge status: Home / Self Care | Payer: BLUE CROSS/BLUE SHIELD | Source: Ambulatory Visit | Attending: Vascular Surgery | Admitting: Vascular Surgery

## 2021-09-25 ENCOUNTER — Encounter: Payer: Self-pay | Admitting: Vascular Surgery

## 2021-09-25 ENCOUNTER — Ambulatory Visit: Payer: BLUE CROSS/BLUE SHIELD | Admitting: Vascular Surgery

## 2021-09-25 VITALS — BP 124/84 | HR 86 | Temp 97.7°F | Resp 20 | Ht 66.0 in | Wt 170.0 lb

## 2021-09-25 DIAGNOSIS — I6523 Occlusion and stenosis of bilateral carotid arteries: Secondary | ICD-10-CM

## 2021-10-08 ENCOUNTER — Telehealth: Payer: Self-pay

## 2021-10-08 NOTE — Telephone Encounter (Signed)
Pt requesting back to work letter w start date 09/15/21. Please e-mail letter to [email or web address]  If unable to e-mail please call pt back at [phone].

## 2021-10-08 NOTE — Telephone Encounter (Signed)
I returned pt's call. She is requesting a letter stating it is ok for her to drive as of 10/11/1832 she and I discussed the letter already placed in the chart stating she could resume driving on 3/73/5789 since this would be 6 months post stroke.   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  I advised I could reprint letter stating she could resume driving on this date and if an additional letter was needed she could let us know. Pt claims post master changed while she was out and they do not have a copy of the previous letter on file any longer.   I have reprinted and e-mailed to [email or web address]. Pt will call back if she does not receive the e-mail.

## 2021-11-06 ENCOUNTER — Ambulatory Visit: Payer: BLUE CROSS/BLUE SHIELD | Admitting: Family Medicine

## 2022-01-17 IMAGING — CT CT HEAD W/O CM
4 series · 15 of 47 positions shown, 17 images · non-contrast
Comparison: CT 11/25/2019

CLINICAL DATA: Severe headache

EXAM:
CT HEAD WITHOUT CONTRAST
TECHNIQUE: Contiguous axial images were obtained from the base of the skull
through the vertex without intravenous contrast.

[Series 3: head wo · axial · 0.34mm/px · z∈[-200,-71]mm · 7 of 36 slices shown, 9 images]
[im 5/36  brain]
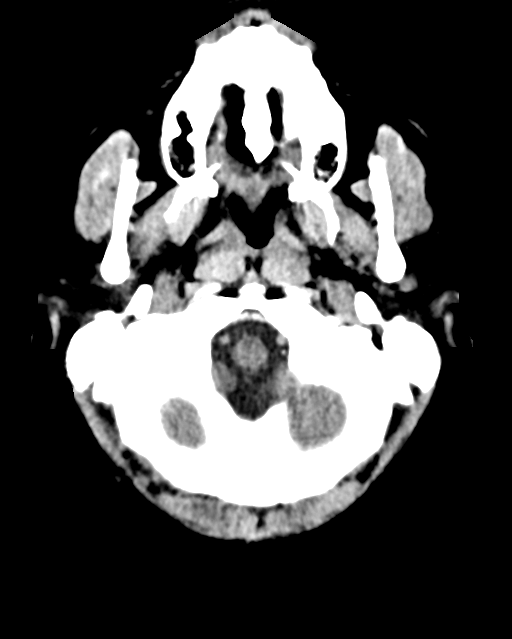
[im 5/36  bone]
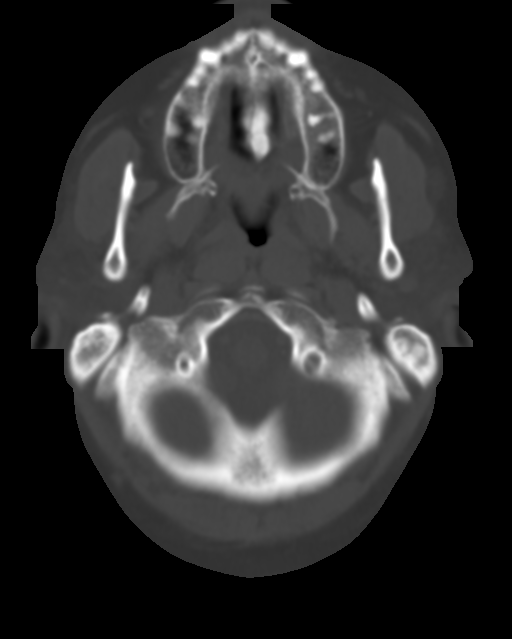
[im 9/36  brain]
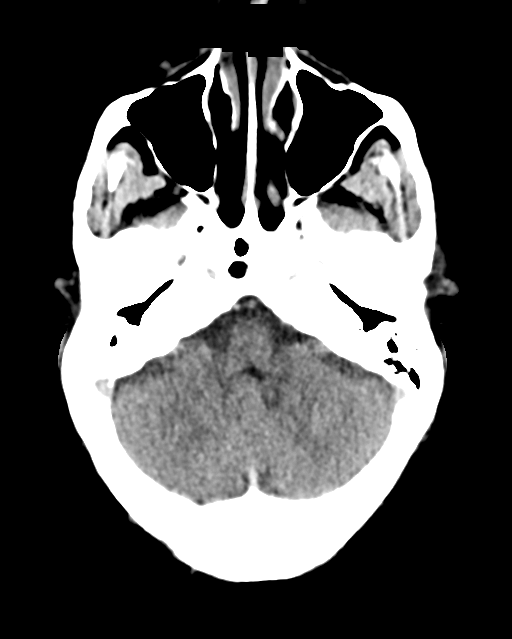
[im 14/36  brain]
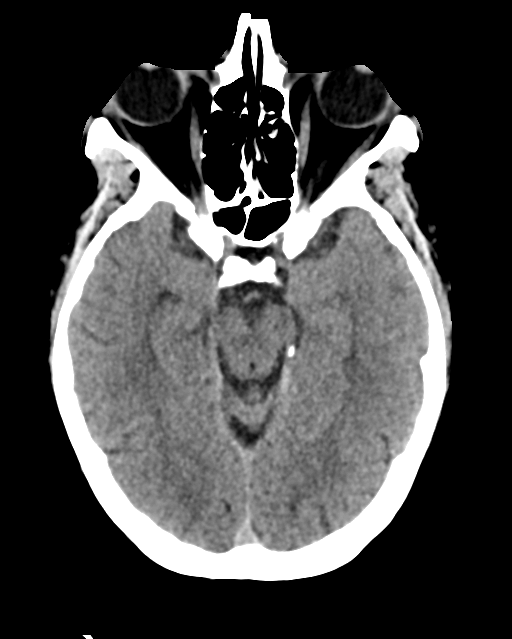
[im 18/36  brain]
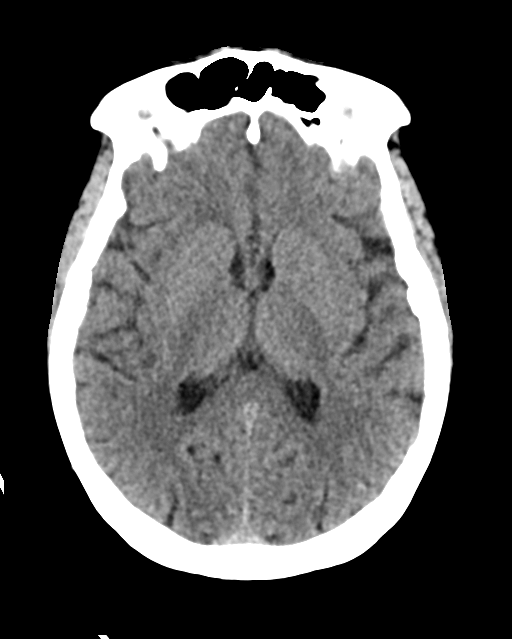
[im 22/36  brain]
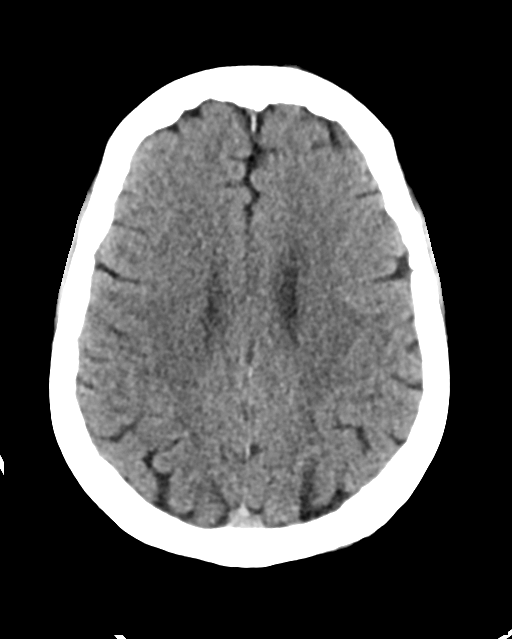
[im 22/36  bone]
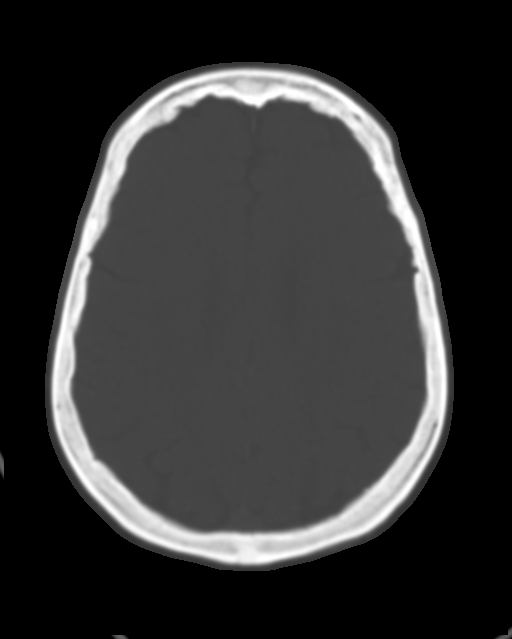
[im 27/36  brain]
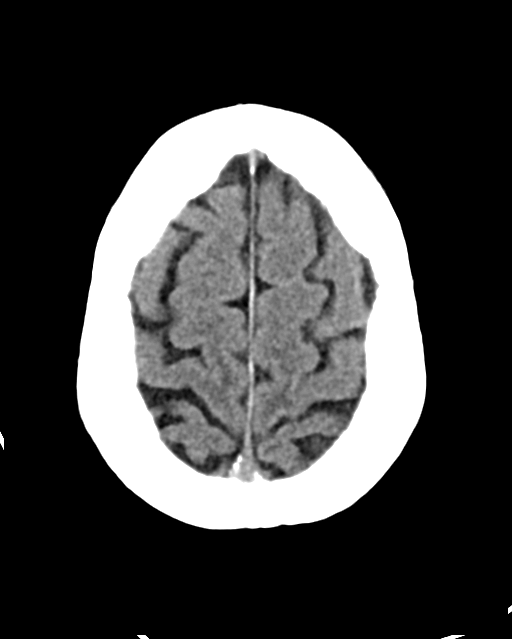
[im 31/36  brain]
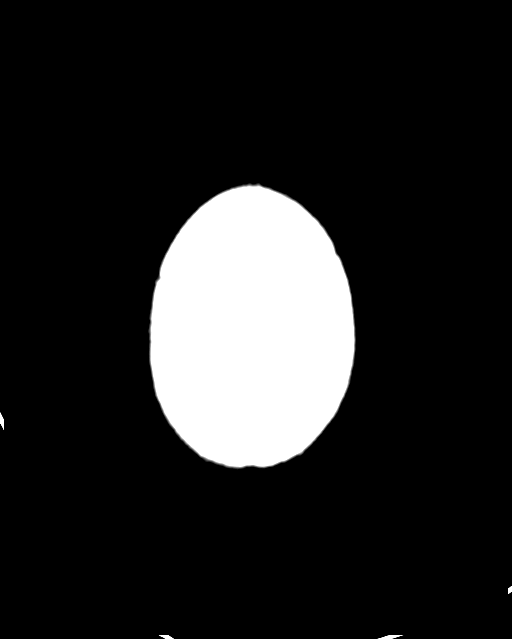

[Series 4: head bone · axial · 0.41mm/px · z∈[-210,-192]mm · 2 of 87 slices shown]
[im 9/87  bone]
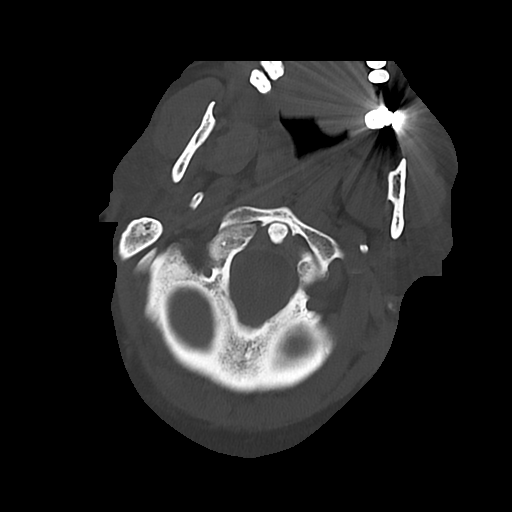
[im 18/87  bone]
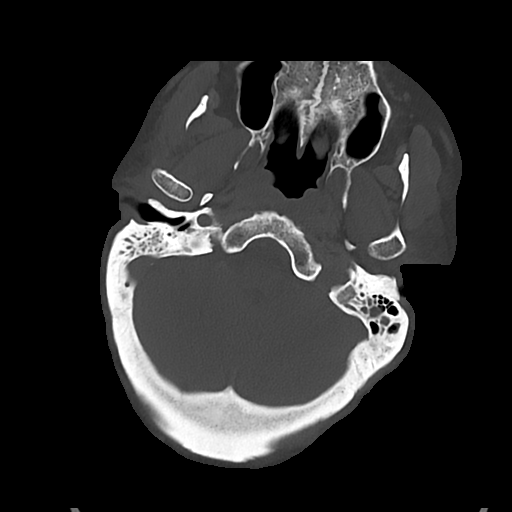

[Series 5: cor soft · coronal · 0.33mm/px · 3 of 71 slices shown]
[im 24/71  brain]
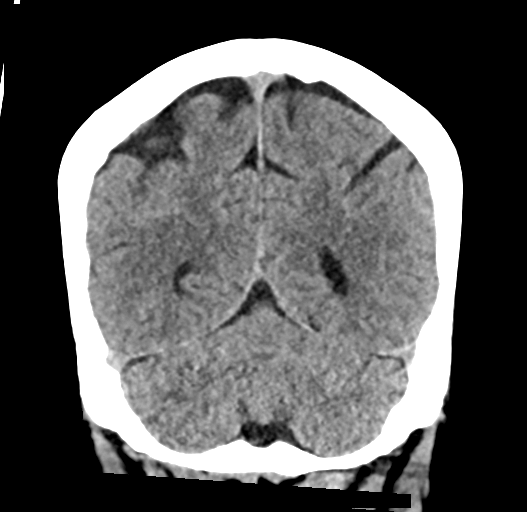
[im 32/71  brain]
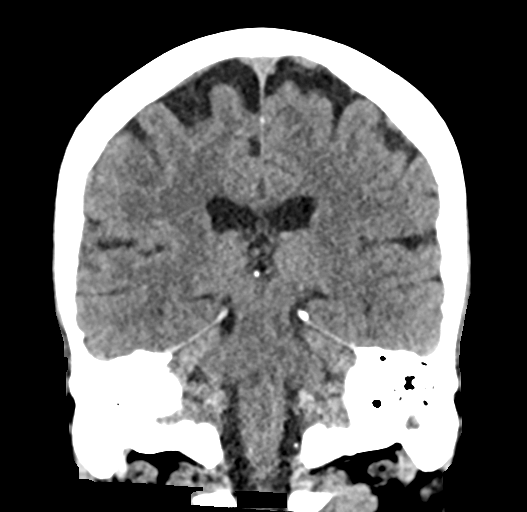
[im 39/71  brain]
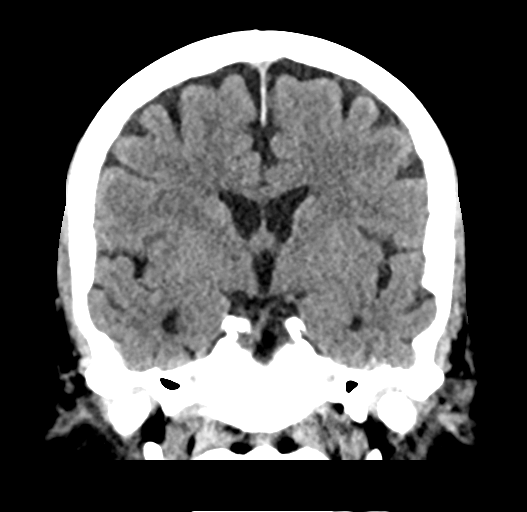

[Series 6: sag soft · sagittal · 0.33mm/px · 3 of 56 slices shown]
[im 19/56  brain]
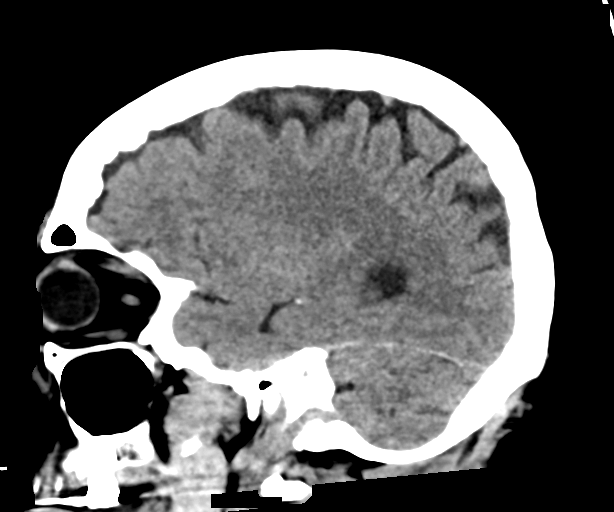
[im 28/56  brain]
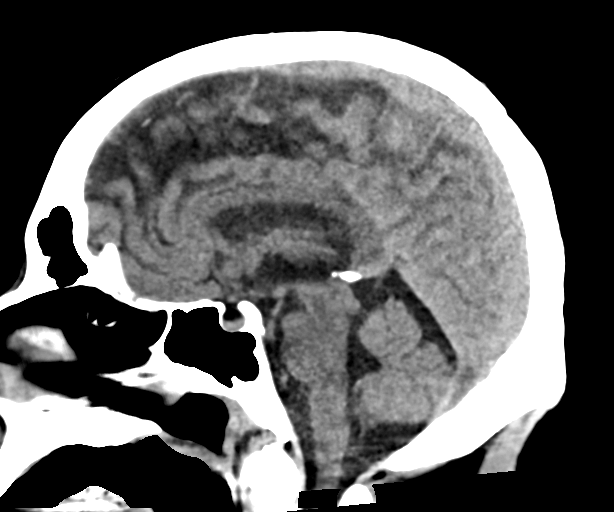
[im 37/56  brain]
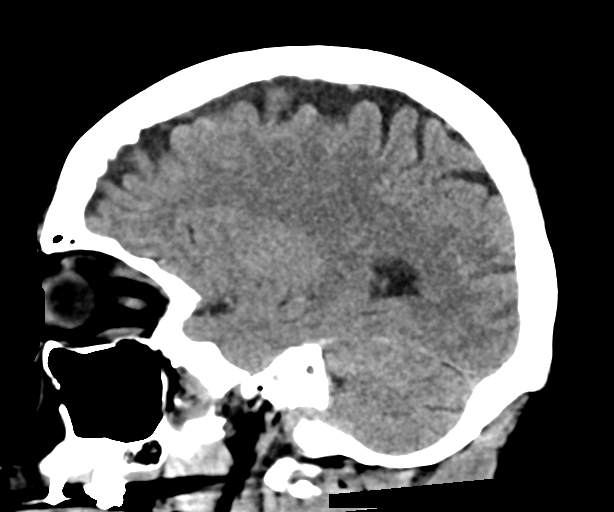

[15 of 47 positions shown; findings below may reference images not displayed]

FINDINGS: Brain: No acute territorial infarction, hemorrhage or intracranial
mass. Small chronic infarct in the right cerebellum. Stable
ventricle size.

Vascular: No hyperdense vessels.  No unexpected calcification

Skull: Normal. Negative for fracture or focal lesion.

Sinuses/Orbits: No acute finding.

Other: None
IMPRESSION: 1. No CT evidence for acute intracranial abnormality.
2. Small chronic infarct in the right cerebellum.

## 2022-02-15 ENCOUNTER — Other Ambulatory Visit: Payer: Self-pay | Admitting: Diagnostic Neuroimaging

## 2022-04-02 IMAGING — CT CT T SPINE W/ CM
1 series · 11 of 14 positions shown, 14 images · non-contrast
Comparison: none

CLINICAL DATA: Increasing left neck, shoulder, and upper extremity
pain. Progressive low back pain, worse on the left. Spinal
stimulator in situ.
TECHNIQUE: Contiguous axial images were obtained through the Cervical,
Thoracic, and Lumbar spine after the intrathecal infusion of
infusion. Coronal and sagittal reconstructions were obtained of the
axial image sets.

[Series 4: t spine bone · axial · 0.43mm/px · z∈[-409,-91]mm · 11 of 126 slices shown, 14 images]
[im 10/126  soft-tissue]
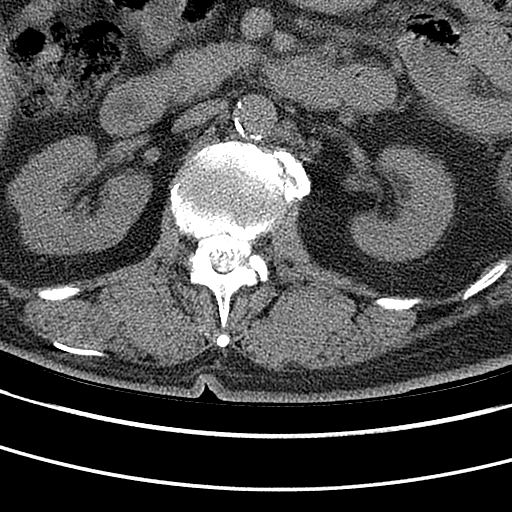
[im 10/126  bone]
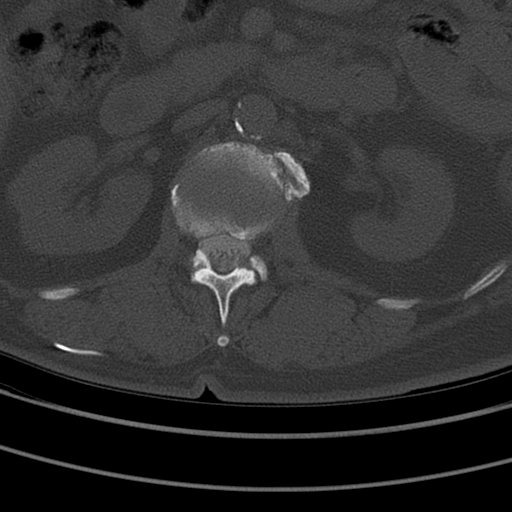
[im 20/126  bone]
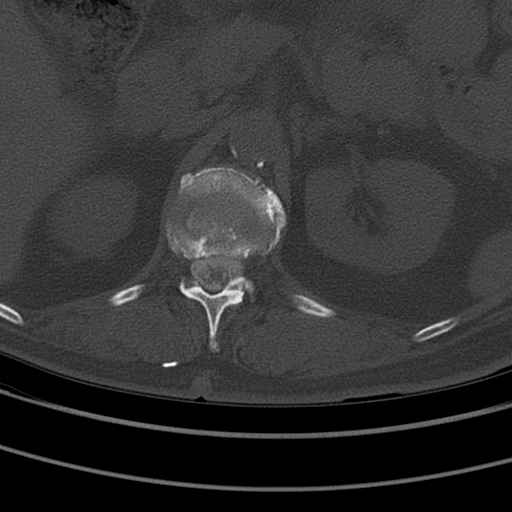
[im 29/126  bone]
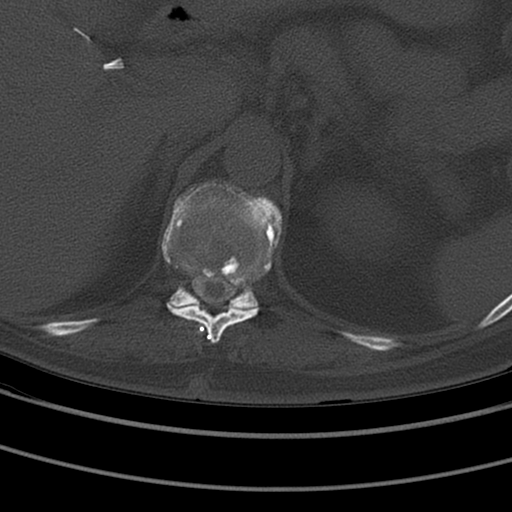
[im 39/126  bone]
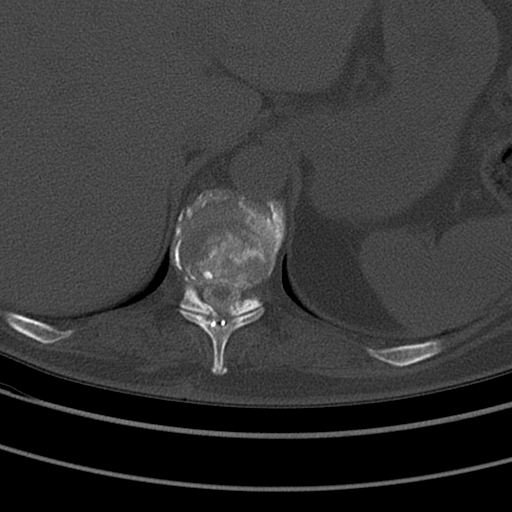
[im 49/126  soft-tissue]
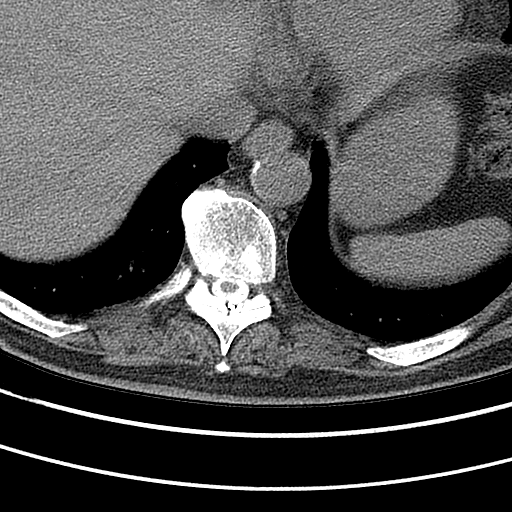
[im 49/126  bone]
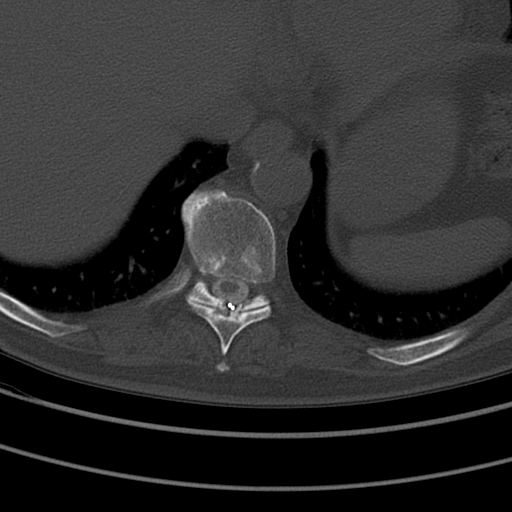
[im 68/126  bone]
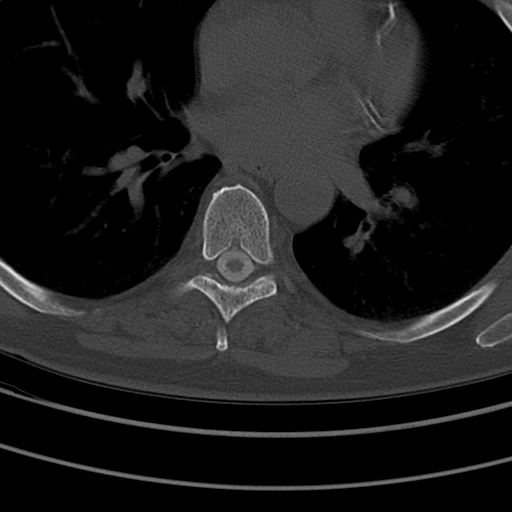
[im 77/126  bone]
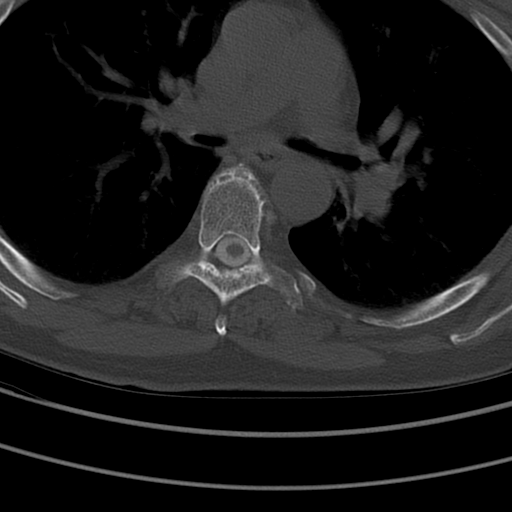
[im 87/126  bone]
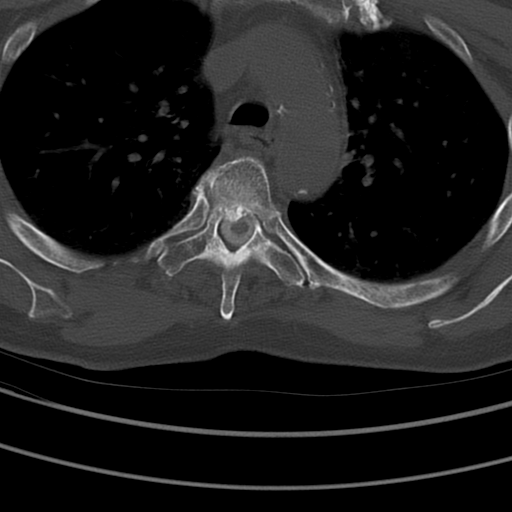
[im 97/126  soft-tissue]
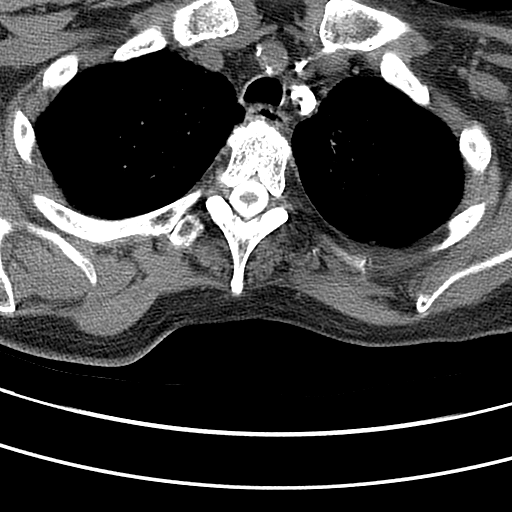
[im 97/126  bone]
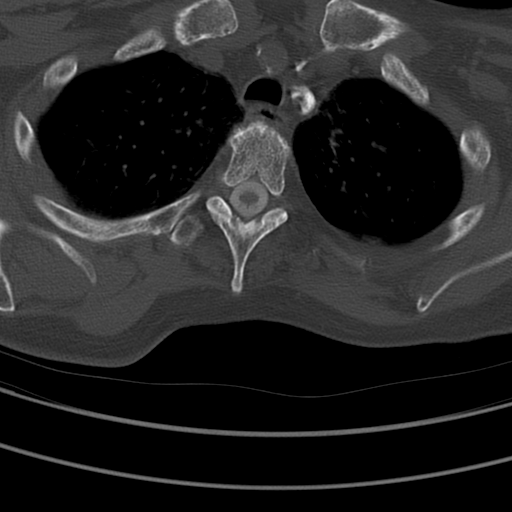
[im 106/126  bone]
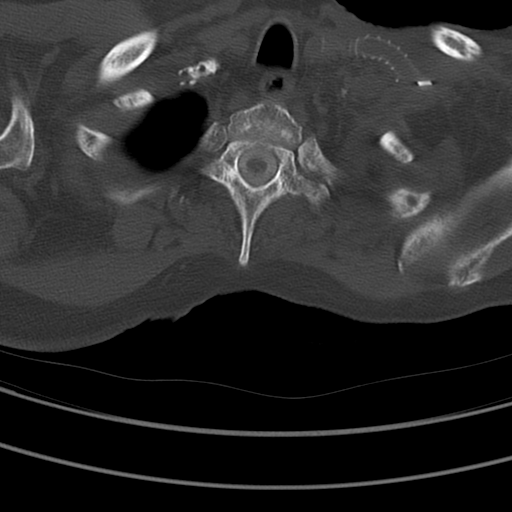
[im 116/126  bone]
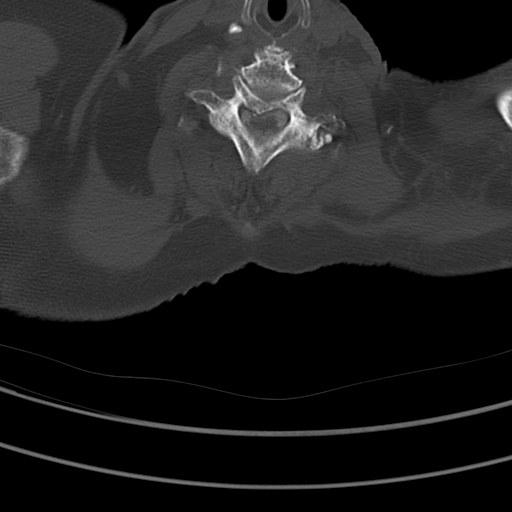

[11 of 14 positions shown; findings below may reference images not displayed]

FLUOROSCOPY TIME:  Radiation Exposure Index (as provided by the
fluoroscopic device): 1102.53 uGy*m2

PROCEDURE:
LUMBAR PUNCTURE FOR CERVICAL LUMBAR AND THORACIC MYELOGRAM

CERVICAL AND LUMBAR AND THORACIC MYELOGRAM

CT CERVICAL MYELOGRAM

CT LUMBAR MYELOGRAM

CT THORACIC MYELOGRAM

After thorough discussion of risks and benefits of the procedure
including bleeding, infection, injury to nerves, blood vessels,
adjacent structures as well as headache and CSF leak, written and
oral informed consent was obtained. Consent was obtained by Dr.
Easycar Ramdane.

Patient was positioned prone on the fluoroscopy table. Local
anesthesia was provided with 1% lidocaine without epinephrine after
prepped and draped in the usual sterile fashion. Puncture was
performed at L2-3 using a 3 1/2 inch 22-gauge spinal needle via
right paramedian approach. Using a single pass through the dura, the
needle was placed within the thecal sac, with return of clear CSF.
10 mL Isovue [id number] was injected into the thecal sac, with normal
opacification of the nerve roots and cauda equina consistent with
free flow within the subarachnoid space. The patient was then moved
to the trendelenburg position and contrast flowed into the Thoracic
and Cervical spine regions.

I personally performed the lumbar puncture and administered the
intrathecal contrast. I also personally supervised acquisition of
the myelogram images.
FINDINGS: MYELOGRAM FINDINGS:

Initial block contrast is noted at the L2-3 level with patient in
the prone position. We were able to get contrast above this level
with patient in the supine position. Slight retrolisthesis is
present at L4-5. No other significant listhesis is present.

Complete filling is noted in the right L5 nerve root consistent with
subarticular narrowing the L4-5 level. Prominent L2-3 disc
protrusion is present with subarticular narrowing bilaterally at the
L2-3 level mild greater than right.

Endplate changes are present thoracic spine without significant
focal stenosis. Dextroconvex curvature is present in the lower
thoracic spine.

Contrast somewhat faint in the cervical spine. Endplate changes are
present at C3-4, C4-5, C5-6, and C6-7. See CT for detailed
evaluation.

CT CERVICAL MYELOGRAM FINDINGS:

Cervical spine is imaged from the skull base through the
cervicothoracic junction. Craniocervical junction is normal.
Visualized intracranial contents are within normal limits.

Slight degenerative anterolisthesis present at C3-4. No other
significant listhesis is present. Straightening of cervical lordosis
is noted. Atherosclerotic calcifications are present within the
carotid bifurcations bilaterally, prominent right left. The soft
tissues the neck are otherwise unremarkable.

C2-3: Asymmetric left-sided facet hypertrophy is present. Shallow
central disc protrusion is present. No significant stenosis is
present.

C3-4: Facet hypertrophy is asymmetric on the right. Uncovertebral
and facet disease result in moderate foraminal narrowing
bilaterally. There is effacement of ventral CSF. Slight distortion
of the ventral surface the cord is noted.

C4-5: Broad-based disc osteophyte complex is present. Partial
effacement ventral CSF is noted. Mild foraminal narrowing is worse
on the left.

C5-6: A leftward disc osteophyte complex is present. Canal is
narrowed to under 7 mm. Moderate left and mild right foraminal
narrowing is present.

C6-7: Rightward disc osteophyte complex is present. There is
significant distortion of the right side of the spinal cord. The
canal is narrowed to 6 mm on the right. Severe right and moderate
left foraminal stenosis is present.

C7-T1: Negative.

CT LUMBAR MYELOGRAM FINDINGS:

Five non rib-bearing lumbar type vertebral bodies are present.
Slight retrolisthesis present at L4-5. No other significant
listhesis is present. There is some straightening of the normal
lumbar lordosis.

Vertebral body heights are normal. Endplate sclerotic changes are
seen bilaterally at L5-S1. Calcified disc is present at L3-4.

Atherosclerotic calcifications present in the aorta branch vessels
without aneurysm. Cholecystectomy is noted. Visualized abdomen is
otherwise unremarkable. No significant adenopathy is present.

L1-2: Asymmetric right-sided facet spurring is present. No
significant stenosis is present.

L2-3: A broad-based disc protrusion is present. Moderate facet
hypertrophy and ligamentum flavum thickening results in moderate
central canal stenosis with subarticular narrowing bilaterally.
Moderate foraminal narrowing is worse on the left.

L3-4: A broad-based disc protrusion present. Moderate facet
hypertrophy and ligamentum flavum thickening is seen bilaterally.
Mild right subarticular narrowing is present. Mild foraminal
narrowing is evident bilaterally.

L4-5: Broad-based disc protrusion is present. Endplate spurring and
facet hypertrophy contribute to moderate right and mild left
subarticular narrowing. Moderate foraminal narrowing is worse on the
right.

L5-S1: Large disc extrusion is noted centrally mild right
subarticular narrowing. Moderate to severe foraminal narrowing is
worse right left.

CT THORACIC MYELOGRAM FINDINGS:

Vertebral body heights are normal. No significant listhesis is
present. Thoracic kyphosis is preserved.

Proximal right rib fracture is nondisplaced appears to be healing.
Nondisplaced anterior left first rib fracture is present.
Nondisplaced left third and fourth rib fractures are healing. No
pneumothorax is present.

Healing left first and second transverse process fractures.

Dorsal spinal cord stimulator terminates at T7-8.

Prominent right paramedian osteophytes efface the ventral CSF
contacts cord on the right at T4-5. Shallow disc protrusions are
present at T5-6 and T6-7. No other significant central disc
protrusion or stenosis is present.

Asymmetric left-sided facet spurring present at T10-11 with mild
left foraminal narrowing. Foramina are otherwise patent bilaterally.

Atherosclerotic calcifications are present in the aorta and branch
vessels. Heart is mildly enlarged. Coronary artery calcifications
are present.
IMPRESSION: 1. Broad-based disc protrusion and moderate facet hypertrophy at
L2-3 results in moderate central canal stenosis with subarticular
narrowing bilaterally, worse on the left. Moderate bilateral
foraminal narrowing is present as well. Contrast is partially
blocked at this level on the initial myelogram.
2. Mild foraminal narrowing bilaterally at L3-4.
3. Moderate right and mild left subarticular narrowing at L4-5 with
moderate foraminal narrowing, worse on the right.
4. Large disc extrusion with mild right subarticular narrowing at
L5-S1.
5. Moderate to severe foraminal narrowing bilaterally at L5-S1 is
worse right.
6. Moderate foraminal narrowing bilaterally at C3-4 and C4-5 is
worse on the left.
7. Mild left foraminal narrowing at T10-11 secondary to facet
disease.
8. Dorsal spinal cord stimulator terminates at T7-8.
9. Prominent right paramedian endplate osteophytes at T4-5 with mild
right central canal stenosis.
10. Shallow disc protrusions at T5-6 and T6-7.
11. Healing bilateral upper rib fractures as described.
12. Coronary artery disease.
13. Aortic Atherosclerosis ([id number]).

## 2022-04-02 IMAGING — CT CT CERVICAL SPINE W/ CM
3 series · 8 of 14 positions shown, 9 images · non-contrast
Comparison: none

CLINICAL DATA: Increasing left neck, shoulder, and upper extremity
pain. Progressive low back pain, worse on the left. Spinal
stimulator in situ.
TECHNIQUE: Contiguous axial images were obtained through the Cervical,
Thoracic, and Lumbar spine after the intrathecal infusion of
infusion. Coronal and sagittal reconstructions were obtained of the
axial image sets.

[Series 3: cspine soft · axial · 0.36mm/px · z∈[-194,-134]mm · 2 of 91 slices shown]
[im 31/91  soft-tissue]
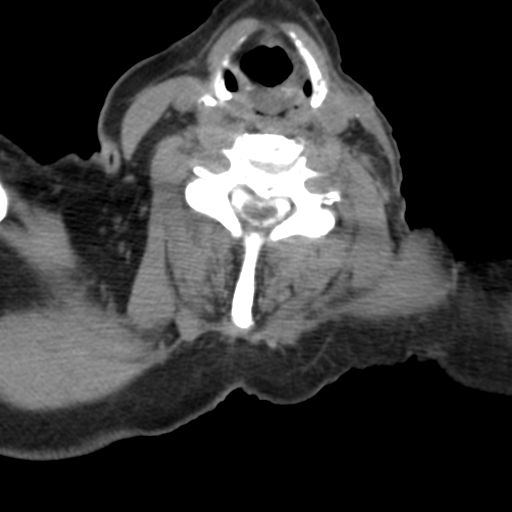
[im 61/91  soft-tissue]
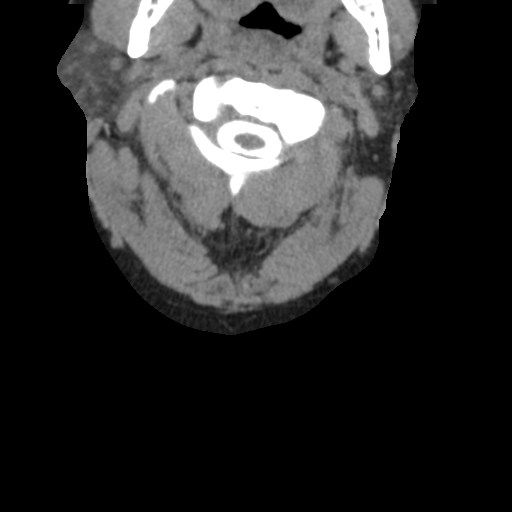

[Series 9: angled axial · axial · 0.34mm/px · z∈[-209,-119]mm · 3 of 91 slices shown (1 of 2)]
[im 23/91  bone]
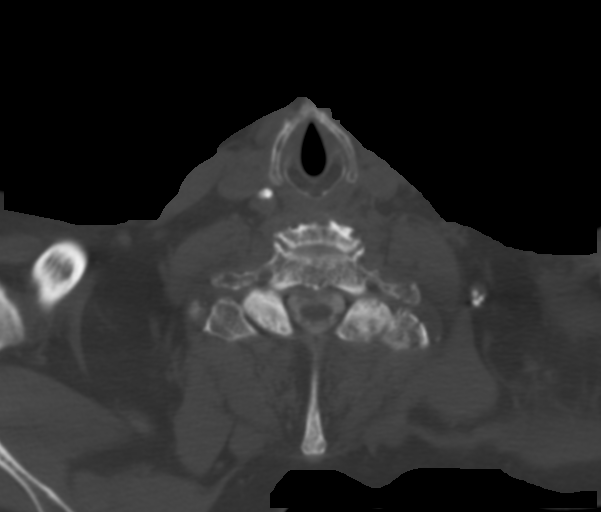
[im 46/91  bone]
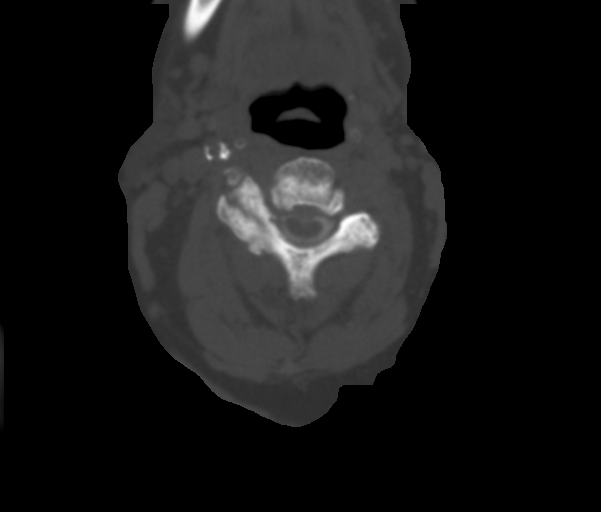
[im 68/91  bone]
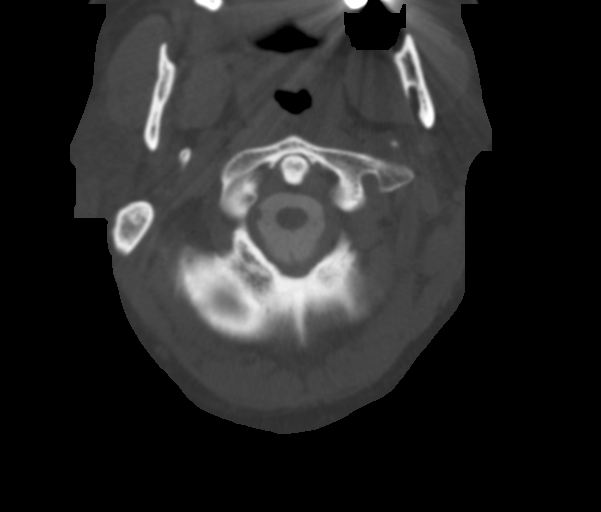

[Series 10: angled axial · axial · 0.34mm/px · z∈[-245,-154]mm · 3 of 96 slices shown, 4 images (2 of 2)]
[im 24/96  soft-tissue]
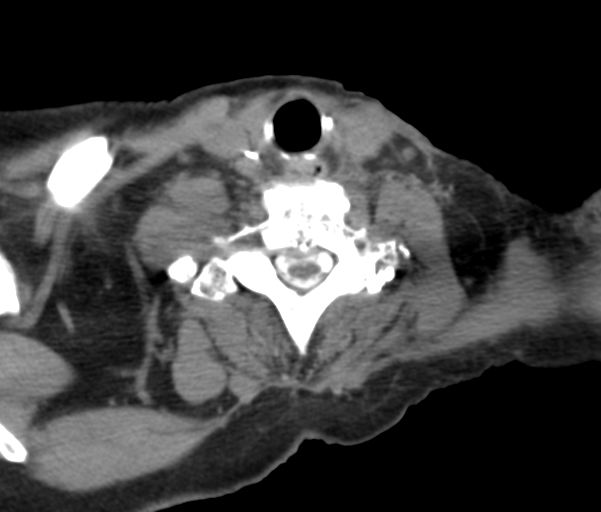
[im 24/96  bone]
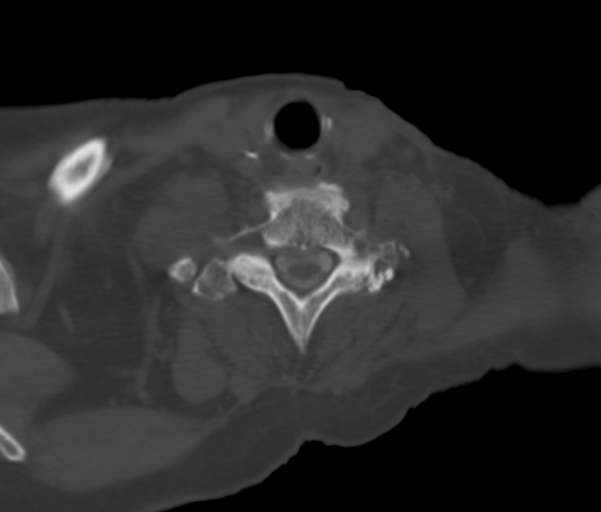
[im 48/96  bone]
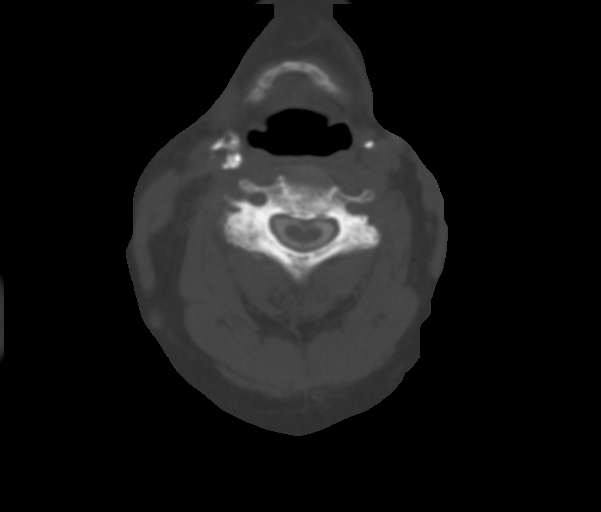
[im 72/96  bone]
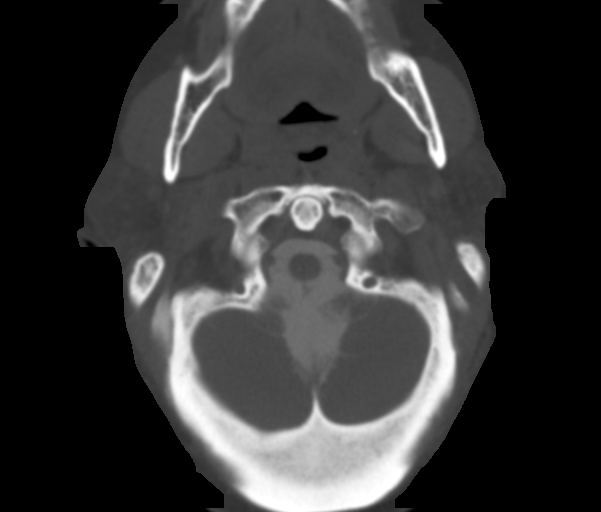

[8 of 14 positions shown; findings below may reference images not displayed]

FLUOROSCOPY TIME:  Radiation Exposure Index (as provided by the
fluoroscopic device): 1102.53 uGy*m2

PROCEDURE:
LUMBAR PUNCTURE FOR CERVICAL LUMBAR AND THORACIC MYELOGRAM

CERVICAL AND LUMBAR AND THORACIC MYELOGRAM

CT CERVICAL MYELOGRAM

CT LUMBAR MYELOGRAM

CT THORACIC MYELOGRAM

After thorough discussion of risks and benefits of the procedure
including bleeding, infection, injury to nerves, blood vessels,
adjacent structures as well as headache and CSF leak, written and
oral informed consent was obtained. Consent was obtained by Dr.
Easycar Ramdane.

Patient was positioned prone on the fluoroscopy table. Local
anesthesia was provided with 1% lidocaine without epinephrine after
prepped and draped in the usual sterile fashion. Puncture was
performed at L2-3 using a 3 1/2 inch 22-gauge spinal needle via
right paramedian approach. Using a single pass through the dura, the
needle was placed within the thecal sac, with return of clear CSF.
10 mL Isovue 9-KBB was injected into the thecal sac, with normal
opacification of the nerve roots and cauda equina consistent with
free flow within the subarachnoid space. The patient was then moved
to the trendelenburg position and contrast flowed into the Thoracic
and Cervical spine regions.

I personally performed the lumbar puncture and administered the
intrathecal contrast. I also personally supervised acquisition of
the myelogram images.
FINDINGS: MYELOGRAM FINDINGS:

Initial block contrast is noted at the L2-3 level with patient in
the prone position. We were able to get contrast above this level
with patient in the supine position. Slight retrolisthesis is
present at L4-5. No other significant listhesis is present.

Complete filling is noted in the right L5 nerve root consistent with
subarticular narrowing the L4-5 level. Prominent L2-3 disc
protrusion is present with subarticular narrowing bilaterally at the
L2-3 level mild greater than right.

Endplate changes are present thoracic spine without significant
focal stenosis. Dextroconvex curvature is present in the lower
thoracic spine.

Contrast somewhat faint in the cervical spine. Endplate changes are
present at C3-4, C4-5, C5-6, and C6-7. See CT for detailed
evaluation.

CT CERVICAL MYELOGRAM FINDINGS:

Cervical spine is imaged from the skull base through the
cervicothoracic junction. Craniocervical junction is normal.
Visualized intracranial contents are within normal limits.

Slight degenerative anterolisthesis present at C3-4. No other
significant listhesis is present. Straightening of cervical lordosis
is noted. Atherosclerotic calcifications are present within the
carotid bifurcations bilaterally, prominent right left. The soft
tissues the neck are otherwise unremarkable.

C2-3: Asymmetric left-sided facet hypertrophy is present. Shallow
central disc protrusion is present. No significant stenosis is
present.

C3-4: Facet hypertrophy is asymmetric on the right. Uncovertebral
and facet disease result in moderate foraminal narrowing
bilaterally. There is effacement of ventral CSF. Slight distortion
of the ventral surface the cord is noted.

C4-5: Broad-based disc osteophyte complex is present. Partial
effacement ventral CSF is noted. Mild foraminal narrowing is worse
on the left.

C5-6: A leftward disc osteophyte complex is present. Canal is
narrowed to under 7 mm. Moderate left and mild right foraminal
narrowing is present.

C6-7: Rightward disc osteophyte complex is present. There is
significant distortion of the right side of the spinal cord. The
canal is narrowed to 6 mm on the right. Severe right and moderate
left foraminal stenosis is present.

C7-T1: Negative.

CT LUMBAR MYELOGRAM FINDINGS:

Five non rib-bearing lumbar type vertebral bodies are present.
Slight retrolisthesis present at L4-5. No other significant
listhesis is present. There is some straightening of the normal
lumbar lordosis.

Vertebral body heights are normal. Endplate sclerotic changes are
seen bilaterally at L5-S1. Calcified disc is present at L3-4.

Atherosclerotic calcifications present in the aorta branch vessels
without aneurysm. Cholecystectomy is noted. Visualized abdomen is
otherwise unremarkable. No significant adenopathy is present.

L1-2: Asymmetric right-sided facet spurring is present. No
significant stenosis is present.

L2-3: A broad-based disc protrusion is present. Moderate facet
hypertrophy and ligamentum flavum thickening results in moderate
central canal stenosis with subarticular narrowing bilaterally.
Moderate foraminal narrowing is worse on the left.

L3-4: A broad-based disc protrusion present. Moderate facet
hypertrophy and ligamentum flavum thickening is seen bilaterally.
Mild right subarticular narrowing is present. Mild foraminal
narrowing is evident bilaterally.

L4-5: Broad-based disc protrusion is present. Endplate spurring and
facet hypertrophy contribute to moderate right and mild left
subarticular narrowing. Moderate foraminal narrowing is worse on the
right.

L5-S1: Large disc extrusion is noted centrally mild right
subarticular narrowing. Moderate to severe foraminal narrowing is
worse right left.

CT THORACIC MYELOGRAM FINDINGS:

Vertebral body heights are normal. No significant listhesis is
present. Thoracic kyphosis is preserved.

Proximal right rib fracture is nondisplaced appears to be healing.
Nondisplaced anterior left first rib fracture is present.
Nondisplaced left third and fourth rib fractures are healing. No
pneumothorax is present.

Healing left first and second transverse process fractures.

Dorsal spinal cord stimulator terminates at T7-8.

Prominent right paramedian osteophytes efface the ventral CSF
contacts cord on the right at T4-5. Shallow disc protrusions are
present at T5-6 and T6-7. No other significant central disc
protrusion or stenosis is present.

Asymmetric left-sided facet spurring present at T10-11 with mild
left foraminal narrowing. Foramina are otherwise patent bilaterally.

Atherosclerotic calcifications are present in the aorta and branch
vessels. Heart is mildly enlarged. Coronary artery calcifications
are present.
IMPRESSION: 1. Broad-based disc protrusion and moderate facet hypertrophy at
L2-3 results in moderate central canal stenosis with subarticular
narrowing bilaterally, worse on the left. Moderate bilateral
foraminal narrowing is present as well. Contrast is partially
blocked at this level on the initial myelogram.
2. Mild foraminal narrowing bilaterally at L3-4.
3. Moderate right and mild left subarticular narrowing at L4-5 with
moderate foraminal narrowing, worse on the right.
4. Large disc extrusion with mild right subarticular narrowing at
L5-S1.
5. Moderate to severe foraminal narrowing bilaterally at L5-S1 is
worse right.
6. Moderate foraminal narrowing bilaterally at C3-4 and C4-5 is
worse on the left.
7. Mild left foraminal narrowing at T10-11 secondary to facet
disease.
8. Dorsal spinal cord stimulator terminates at T7-8.
9. Prominent right paramedian endplate osteophytes at T4-5 with mild
right central canal stenosis.
10. Shallow disc protrusions at T5-6 and T6-7.
11. Healing bilateral upper rib fractures as described.
12. Coronary artery disease.
13. Aortic Atherosclerosis ([id number]).

## 2022-05-01 ENCOUNTER — Ambulatory Visit: Payer: BLUE CROSS/BLUE SHIELD | Admitting: Family Medicine

## 2022-05-01 ENCOUNTER — Encounter: Payer: Self-pay | Admitting: Family Medicine

## 2022-05-01 VITALS — BP 101/71 | HR 81 | Ht 66.0 in | Wt 151.0 lb

## 2022-05-01 DIAGNOSIS — G40209 Localization-related (focal) (partial) symptomatic epilepsy and epileptic syndromes with complex partial seizures, not intractable, without status epilepticus: Secondary | ICD-10-CM | POA: Diagnosis not present

## 2022-05-01 MED ORDER — LEVETIRACETAM 750 MG PO TABS: 750.0000 mg | ORAL_TABLET | Freq: Two times a day (BID) | ORAL | 4 refills | Status: DC

## 2022-05-01 NOTE — Patient Instructions (Signed)
Below is our plan:  We will continue levetiracetam 750mg twice daily.   Please make sure you are consistent with timing of seizure medication. I recommend annual visit with primary care provider (PCP) for complete physical and routine blood work. I recommend daily intake of vitamin D (400-800iu) and calcium (800-1000mg) for bone health. Discuss Dexa screening with PCP.   According to [place] law, you can not drive unless you are seizure / syncope free for at least 6 months and under physician's care.  Please maintain precautions. Do not participate in activities where a loss of awareness could harm you or someone else. No swimming alone, no tub bathing, no hot tubs, no driving, no operating motorized vehicles (cars, ATVs, motocycles, etc), lawnmowers, power tools or firearms. No standing at heights, such as rooftops, ladders or stairs. Avoid hot objects such as stoves, heaters, open fires. Wear a helmet when riding a bicycle, scooter, skateboard, etc. and avoid areas of traffic. Set your water heater to 120 degrees or less.  Please make sure you are staying well hydrated. I recommend 50-60 ounces daily. Well balanced diet and regular exercise encouraged. Consistent sleep schedule with 6-8 hours recommended.   Please continue follow up with care team as directed.   Follow up with me in 1 year   You may receive a survey regarding today's visit. I encourage you to leave honest feed back as I do use this information to improve patient care. Thank you for seeing me today!    

## 2022-05-01 NOTE — Progress Notes (Signed)
Chief Complaint  Patient presents with   Room 1    Pt is here Alone. Pt states that she hasn't had any seizures. Pt states that 2 time she was incoherent and her sugar was too low.      HISTORY OF PRESENT ILLNESS:  05/01/22 ALL:  Terri Li is a 62 y.o. female here today for follow up for partial seizures. Levetiracetam 739m BID. She denies recent seizure activity. Last event 03/2021. She is driving without difficulty. She works full time for [employer]   She reports two episodes of hypoglycemia CBGs were 40 and 10). EEG normal. Meal coverage and basil insulin were decreased as well as metformin. Jardiance continued.  Last A1C around 6. No hypoglycemic events since.   She continues atorvastatin 879mand Plavix for stroke prevention. She is followed regularly by PCP.   HISTORY (copied from Dr PeGladstone Lighterrevious note)  UPDATE (05/02/21, VRP): Since last visit, had episode of speech arrest with whole body stiffness (7-1081mtes). Went to ER. Had some RUE and RLE weakness, which gradually improved. Dx'd with possible breakthrough seizure.  LEV increased to 750m74mice a day. Doing well now.     UPDATE (08/15/20, VRP): Since last visit, doing well. Symptoms are resolved. No more seizures. Tolerating LEV. Some mental slowing with LEV.     PRIOR HPI: 59 y11r old female here for evaluation of abnormal spells.  History of left subclavian stenosis [place], hypertension, diabetes, hyperlipidemia, tobacco abuse, COPD.   02/04/2020 patient had episode at home where her head and eyes were deviated to the right side, right arm raised up into the air with abnormal movements, lasted for few minutes.  Patient had some fatigue and confusion afterwards.  She was having trouble talking.  She had 5-7 episodes at home on that day.  Her sister came home and witnessed 1 of these events and called 911.  Patient was taken the hospital.  By that  time symptoms had resolved and patient was mainly complaining of headaches.  She had a CT scan of the head and headache evaluation.  She was discharged home.   Patient has ongoing similar episodes of right gaze and head deviation, right arm movements, confusion and speech difficulty on October 9, 10th, 12th, 14th.  Following this no further events.  Patient felt tired and confused afterwards.   Patient continues to have intermittent headaches.   On 03/07/2020 patient slipped and fell down some steps resulting in multiple C-spine, T-spine, L-spine, rib and left clavicle fractures.  She denies any seizure-like or strokelike activity at the onset of the fall and states that this was an accidental slip.   No prior history of seizures.   REVIEW OF SYSTEMS: Out of a complete 14 system review of symptoms, the patient complains only of the following symptoms, none and all other reviewed systems are negative.   ALLERGIES: Allergies  Allergen Reactions   Codeine Itching   Hydrocodone Itching     HOME MEDICATIONS: Outpatient Medications Prior to Visit  Medication Sig Dispense Refill   albuterol (VENTOLIN HFA) 108 (90 Base) MCG/ACT inhaler Inhale 2 puffs into the lungs every 4 (four) hours as needed.     atorvastatin (LIPITOR) 80 MG tablet Take 80 mg by mouth daily.      clopidogrel (PLAVIX) 75 MG tablet TAKE 1 TABLET BY MOUTH EVERY DAY 90 tablet 3   empagliflozin (JARDIANCE) 25 MG TABS tablet Take 25 mg by mouth  daily.     fluticasone (FLONASE) 50 MCG/ACT nasal spray Place 1 spray into both nostrils daily as needed for allergies or rhinitis.     Gabapentin, Once-Daily, (GRALISE) 600 MG TABS Take 1,800 mg by mouth at bedtime.     levothyroxine (SYNTHROID) 150 MCG tablet Take 150 mcg by mouth daily.     lisinopril (ZESTRIL) 10 MG tablet Take 10 mg by mouth daily.      metFORMIN (GLUCOPHAGE) 500 MG tablet Take 1,000-1,500 mg by mouth See admin instructions. Take 1500 mg in the morning and 1000 mg  at night     methocarbamol (ROBAXIN) 500 MG tablet Take 1 tablet (500 mg total) by mouth every 8 (eight) hours as needed for muscle spasms. 20 tablet 0   NOVOLOG FLEXPEN 100 UNIT/ML FlexPen INJECT 15 UNITS SOLUTION PEN-INJECTOR WITH BREAKFAST AND LUNCH AND 25 WITH DINNER     Tapentadol HCl (NUCYNTA) 100 MG TABS Take 100 mg by mouth 2 (two) times daily.     TOUJEO SOLOSTAR 300 UNIT/ML Solostar Pen Inject 60 Units into the skin at bedtime.      venlafaxine (EFFEXOR) 37.5 MG tablet Take 37.5 mg by mouth at bedtime.     levETIRAcetam (KEPPRA) 750 MG tablet Take 1 tablet (750 mg total) by mouth 2 (two) times daily. 180 tablet 4   nicotine (NICODERM CQ - DOSED IN MG/24 HOURS) 14 mg/24hr patch Place 1 patch (14 mg total) onto the skin daily. (Patient not taking: Reported on 09/25/2021) 28 patch 0   levETIRAcetam (KEPPRA) 500 MG tablet TAKE 1 TABLET BY MOUTH TWICE A DAY 180 tablet 4   No facility-administered medications prior to visit.     PAST MEDICAL HISTORY: Past Medical History:  Diagnosis Date   Cancer [facility])    thyroid   Carotid artery stenosis    COPD (chronic obstructive pulmonary disease) ([place])    Diabetes mellitus without complication ([place])    type 2   Hyperlipidemia    Hypertension    Hypothyroidism    Insomnia    Migraine    with a seizue   Seizures ([place]) 02/04/2020   Stroke ([place]) 12/2019   H/O   Thyroid disease    cancer     PAST SURGICAL HISTORY: Past Surgical History:  Procedure Laterality Date   APPENDECTOMY  1979   BACK SURGERY  2010   several   CAROTID ENDARTERECTOMY Left 09/2019   CHOLECYSTECTOMY  2007   OOPHORECTOMY Right 2008   one   REVERSE SHOULDER ARTHROPLASTY Left 05/10/2020   Procedure: REVERSE SHOULDER ARTHROPLASTY AND LEFT SHOULDER OPEN DISTAL CLAVICLE EXCISION;  Surgeon: Hiram Gash, MD;  Location: [facility];  Service: Orthopedics;  Laterality: Left;  LEFT SHOULDER OPEN DISTAL CLAVICLE EXCISION   SPINAL CORD STIMULATOR IMPLANT  2013, 2017   TOTAL  THYROIDECTOMY  1992     FAMILY HISTORY: Family History  Problem Relation Age of Onset   Heart attack Mother    Arthritis Mother    Arthritis Father    Hypertension Sister    Lung cancer Sister    Hypertension Brother    Stroke Maternal Grandfather      SOCIAL HISTORY: Social History   Socioeconomic History   Marital status: Divorced    Spouse name: Not on file   Number of children: 1   Years of education: Not on file   Highest education level: Associate degree: academic program  Occupational History    Comment: [employer]  Tobacco Use   Smoking  status: Every Day    Packs/day: 1.00    Years: 15.00    Total pack years: 15.00    Types: Cigarettes   Smokeless tobacco: Never  Vaping Use   Vaping Use: Never used  Substance and Sexual Activity   Alcohol use: Not Currently   Drug use: Not Currently    Comment: used 40 yrs ago   Sexual activity: Not on file  Other Topics Concern   Not on file  Social History Narrative   08/15/20 Lives with son   Caffeine- coffee 4 c daily or more   Social Determinants of Health   Financial Resource Strain: Not on file  Food Insecurity: Not on file  Transportation Needs: Not on file  Physical Activity: Not on file  Stress: Not on file  Social Connections: Not on file  Intimate Partner Violence: Not on file     PHYSICAL EXAM  Vitals:   05/01/22 1315  BP: 101/71  Pulse: 81  Weight: 151 lb (68.5 kg)  Height: _0  (1.676 m)   Body mass index is 24.37 kg/m.  Generalized: Well developed, in no acute distress  Cardiology: normal rate and rhythm, no murmur auscultated  Respiratory: clear to auscultation bilaterally    Neurological examination  Mentation: Alert oriented to time, place, history taking. Follows all commands speech and language fluent Cranial nerve II-XII: Pupils were equal round reactive to light. Extraocular movements were full, visual field were full on confrontational test. Facial sensation and strength were  normal. Head turning and shoulder shrug  were normal and symmetric. Motor: The motor testing reveals 5 over 5 strength of all 4 extremities. Good symmetric motor tone is noted throughout.  Gait and station: Gait is normal.    DIAGNOSTIC DATA (LABS, IMAGING, TESTING) - I reviewed patient records, labs, notes, testing and imaging myself where available.  Lab Results  Component Value Date   WBC 7.4 04/17/2021   HGB 15.0 04/17/2021   HCT 44.0 04/17/2021   MCV 92.5 04/17/2021   PLT 294 04/17/2021      Component Value Date/Time   NA 138 04/17/2021 0530   K 4.7 04/17/2021 0530   CL 104 04/17/2021 0530   CO2 26 04/17/2021 0523   GLUCOSE 182 (H) 04/17/2021 0530   BUN 18 04/17/2021 0530   CREATININE 0.70 04/17/2021 0530   CALCIUM 8.8 (L) 04/17/2021 0523   PROT 6.4 (L) 04/17/2021 0523   ALBUMIN 3.5 04/17/2021 0523   AST 34 04/17/2021 0523   ALT 23 04/17/2021 0523   ALKPHOS 34 (L) 04/17/2021 0523   BILITOT 0.5 04/17/2021 0523   GFRNONAA >60 04/17/2021 0523   GFRAA  10/18/2009 0350    >60        The eGFR has been calculated using the MDRD equation. This calculation has not been validated in all clinical situations. eGFR's persistently <60 mL/min signify possible Chronic Kidney Disease.   No results found for: "CHOL", "HDL", "LDLCALC", "LDLDIRECT", "TRIG", "CHOLHDL" Lab Results  Component Value Date   HGBA1C 6.4 (H) 03/08/2020   No results found for: "VITAMINB12" No results found for: "TSH"      No data to display               No data to display           ASSESSMENT AND PLAN  62 y.o. year old female  has a past medical history of Cancer ([place]), Carotid artery stenosis, COPD (chronic obstructive pulmonary disease) ([place]), Diabetes mellitus without complication (  [place]), Hyperlipidemia, Hypertension, Hypothyroidism, Insomnia, Migraine, Seizures ([place]) (02/04/2020), Stroke ([place]) (12/2019), and Thyroid disease. here with    Partial symptomatic epilepsy with complex  partial seizures, not intractable, without status epilepticus ([place])  Terri Li reports doing well. No recent seizure activity. She will continue levetiracetam 76m twice daily. Healthy lifestyle habits encouraged. She will follow up with PCP as directed. She will return to see me in 1 year, sooner if needed. She verbalizes understanding and agreement with this plan.   No orders of the defined types were placed in this encounter.    Meds ordered this encounter  Medications   levETIRAcetam (KEPPRA) 750 MG tablet    Sig: Take 1 tablet (750 mg total) by mouth 2 (two) times daily.    Dispense:  180 tablet    Refill:  4    Order Specific Question:   Supervising Provider    Answer:   AMelvenia Beam[[[id number]    ADebbora Presto MSN, FNP-C 05/01/2022, 1:56 PM  [facility] [address] [place] [phone]

## 2022-06-27 ENCOUNTER — Telehealth: Payer: Self-pay | Admitting: *Deleted

## 2022-06-27 NOTE — Telephone Encounter (Signed)
Faxed completed/signed form to [phone]. Received fax confirmation.

## 2022-07-04 NOTE — Progress Notes (Signed)
Fax received from [facility] for medical clearance/medication hold for SCS/IPG replacement to be signed by Dr. Stanford Breed.  Provider signed, form faxed back to sender, verified successful, sent to scan center.

## 2023-05-06 NOTE — Patient Instructions (Signed)
Below is our plan:  We will continue levetiracetam 750mg  twice daily.   Please make sure you are consistent with timing of seizure medication. I recommend annual visit with primary care provider (PCP) for complete physical and routine blood work. I recommend daily intake of vitamin D (400-800iu) and calcium (800-1000mg ) for bone health. Discuss Dexa screening with PCP.   According to [place] law, you can not drive unless you are seizure / syncope free for at least 6 months and under physician's care.  Please maintain precautions. Do not participate in activities where a loss of awareness could harm you or someone else. No swimming alone, no tub bathing, no hot tubs, no driving, no operating motorized vehicles (cars, ATVs, motocycles, etc), lawnmowers, power tools or firearms. No standing at heights, such as rooftops, ladders or stairs. Avoid hot objects such as stoves, heaters, open fires. Wear a helmet when riding a bicycle, scooter, skateboard, etc. and avoid areas of traffic. Set your water heater to 120 degrees or less.  SUDEP is the sudden, unexpected death of someone with epilepsy, who was otherwise healthy. In SUDEP cases, no other cause of death is found when an autopsy is done. Each year, more than 1 in 1,000 people with epilepsy die from SUDEP. This is the leading cause of death in people with uncontrolled seizures. Until further answers are available, the best way to prevent SUDEP is to lower your risk by controlling seizures. Research has found that people with all types of epilepsy that experience convulsive seizures can be at risk.  Please make sure you are staying well hydrated. I recommend 50-60 ounces daily. Well balanced diet and regular exercise encouraged. Consistent sleep schedule with 6-8 hours recommended.   Please continue follow up with care team as directed.   Follow up with me in 1 year   You may receive a survey regarding today's visit. I encourage you to leave honest feed  back as I do use this information to improve patient care. Thank you for seeing me today!

## 2023-05-06 NOTE — Progress Notes (Signed)
 Chief Complaint  Patient presents with   Room 2    Pt is here Alone. Pt states that everything is going great since her last appointment. Pt has been Seizure Free for 2 years now.     HISTORY OF PRESENT ILLNESS:  05/07/23 ALL:  Terri Li returns for follow up for complex partial seizures. She continues levetiracetam  750mg  BID. She is tolerating well. No seizures. She continues to drive and work without difficulty. She did have a fall up the steps about 2 weeks ago. She has a black eye and shoulder bruise but, otherwise, okay. She has appt with her PCP this month.   05/01/2022 ALL:  Terri Li is a 63 y.o. female here today for follow up for partial seizures. Levetiracetam  750mg  BID. She denies recent seizure activity. Last event 03/2021. She is driving without difficulty. She works full time for [employer].   She reports two episodes of hypoglycemia CBGs were 40 and 10). EEG normal. Meal coverage and basil insulin  were decreased as well as metformin . Jardiance continued.  Last A1C around 6. No hypoglycemic events since.   She continues atorvastatin  80mg  and Plavix  for stroke prevention. She is followed regularly by PCP.   HISTORY (copied from Dr Chancy previous note)  UPDATE (05/02/21, VRP): Since last visit, had episode of speech arrest with whole body stiffness (7-73minutes). Went to ER. Had some RUE and RLE weakness, which gradually improved. Dx'd with possible breakthrough seizure.  LEV increased to 750mg  twice a day. Doing well now.     UPDATE (08/15/20, VRP): Since last visit, doing well. Symptoms are resolved. No more seizures. Tolerating LEV. Some mental slowing with LEV.     PRIOR HPI: 63 year old female here for evaluation of abnormal spells.  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   02/04/2020 patient had episode at home where her head and eyes  were deviated to the right side, right arm raised up into the air with abnormal movements, lasted for few minutes.  Patient had some fatigue and confusion afterwards.  She was having trouble talking.  She had 5-7 episodes at home on that day.  Her sister came home and witnessed 1 of these events and called 911.  Patient was taken the hospital.  By that time symptoms had resolved and patient was mainly complaining of headaches.  She had a CT scan of the head and headache evaluation.  She was discharged home.   Patient has ongoing similar episodes of right gaze and head deviation, right arm movements, confusion and speech difficulty on October 9, 10th, 12th, 14th.  Following this no further events.  Patient felt tired and confused afterwards.   Patient continues to have intermittent headaches.   On 03/07/2020 patient slipped and fell down some steps resulting in multiple C-spine, T-spine, L-spine, rib and left clavicle fractures.  She denies any seizure-like or strokelike activity at the onset of the fall and states that this was an accidental slip.   No prior history of seizures.   REVIEW OF SYSTEMS: Out of a complete 14 system review of symptoms, the patient complains only of the following symptoms, none and all other reviewed systems are negative.   ALLERGIES: Allergies  Allergen Reactions   Codeine Itching   Hydrocodone Itching     HOME MEDICATIONS: Outpatient Medications Prior to Visit  Medication Sig Dispense Refill   albuterol  (VENTOLIN  HFA) 108 (90 Base) MCG/ACT  inhaler Inhale 2 puffs into the lungs every 4 (four) hours as needed.     atorvastatin  (LIPITOR ) 80 MG tablet Take 80 mg by mouth daily.      clopidogrel  (PLAVIX ) 75 MG tablet TAKE 1 TABLET BY MOUTH EVERY DAY 90 tablet 3   empagliflozin (JARDIANCE) 25 MG TABS tablet Take 25 mg by mouth daily.     fluticasone (FLONASE) 50 MCG/ACT nasal spray Place 1 spray into both nostrils daily as needed for allergies or rhinitis.      Gabapentin , Once-Daily, (GRALISE ) 600 MG TABS Take 1,800 mg by mouth at bedtime.     levothyroxine  (SYNTHROID ) 150 MCG tablet Take 150 mcg by mouth daily.     lisinopril  (ZESTRIL ) 10 MG tablet Take 10 mg by mouth daily.      metFORMIN  (GLUCOPHAGE ) 500 MG tablet Take 1,000-1,500 mg by mouth See admin instructions. Take 1500 mg in the morning and 1000 mg at night     methocarbamol  (ROBAXIN ) 500 MG tablet Take 1 tablet (500 mg total) by mouth every 8 (eight) hours as needed for muscle spasms. 20 tablet 0   NOVOLOG  FLEXPEN 100 UNIT/ML FlexPen INJECT 15 UNITS SOLUTION PEN-INJECTOR WITH BREAKFAST AND LUNCH AND 25 WITH DINNER     Tapentadol  HCl (NUCYNTA ) 100 MG TABS Take 100 mg by mouth 2 (two) times daily.     TOUJEO  SOLOSTAR 300 UNIT/ML Solostar Pen Inject 60 Units into the skin at bedtime.      venlafaxine (EFFEXOR) 37.5 MG tablet Take 75 mg by mouth at bedtime.     nicotine  (NICODERM CQ  - DOSED IN MG/24 HOURS) 14 mg/24hr patch Place 1 patch (14 mg total) onto the skin daily. (Patient not taking: Reported on 09/25/2021) 28 patch 0   levETIRAcetam  (KEPPRA ) 750 MG tablet Take 1 tablet (750 mg total) by mouth 2 (two) times daily. 180 tablet 4   No facility-administered medications prior to visit.     PAST MEDICAL HISTORY: Past Medical History:  Diagnosis Date   Cancer [facility])    thyroid   Carotid artery stenosis    COPD (chronic obstructive pulmonary disease) (HCC)    Diabetes mellitus without complication (HCC)    type 2   Hyperlipidemia    Hypertension    Hypothyroidism    Insomnia    Migraine    with a seizue   Seizures (HCC) 02/04/2020   Stroke (HCC) 12/2019   H/O   Thyroid disease    cancer     PAST SURGICAL HISTORY: Past Surgical History:  Procedure Laterality Date   APPENDECTOMY  1979   BACK SURGERY  2010   several   CAROTID ENDARTERECTOMY Left 09/2019   CHOLECYSTECTOMY  2007   OOPHORECTOMY Right 2008   one   REVERSE SHOULDER ARTHROPLASTY Left 05/10/2020   Procedure:  REVERSE SHOULDER ARTHROPLASTY AND LEFT SHOULDER OPEN DISTAL CLAVICLE EXCISION;  Surgeon: Cristy Bonner DASEN, MD;  Location: [facility];  Service: Orthopedics;  Laterality: Left;  LEFT SHOULDER OPEN DISTAL CLAVICLE EXCISION   SPINAL CORD STIMULATOR IMPLANT  2013, 2017   TOTAL THYROIDECTOMY  1992     FAMILY HISTORY: Family History  Problem Relation Age of Onset   Heart attack Mother    Arthritis Mother    Arthritis Father    Hypertension Sister    Lung cancer Sister    Hypertension Brother    Stroke Maternal Grandfather      SOCIAL HISTORY: Social History   Socioeconomic History   Marital status: Divorced  Spouse name: Not on file   Number of children: 1   Years of education: Not on file   Highest education level: Associate degree: academic program  Occupational History    Comment: [employer]  Tobacco Use   Smoking status: Every Day    Current packs/day: 1.00    Average packs/day: 1 pack/day for 15.0 years (15.0 ttl pk-yrs)    Types: Cigarettes   Smokeless tobacco: Never  Vaping Use   Vaping status: Never Used  Substance and Sexual Activity   Alcohol use: Not Currently   Drug use: Not Currently    Comment: used 40 yrs ago   Sexual activity: Not on file  Other Topics Concern   Not on file  Social History Narrative   08/15/20 Lives with son   Caffeine- coffee 4 c daily or more   Social Drivers of Corporate Investment Banker Strain: Not on file  Food Insecurity: Not on file  Transportation Needs: Not on file  Physical Activity: Not on file  Stress: Not on file  Social Connections: Not on file  Intimate Partner Violence: Not on file     PHYSICAL EXAM  Vitals:   05/07/23 1312  BP: 138/85  Pulse: 65  Weight: 157 lb (71.2 kg)  Height: 5' 6 (1.676 m)    Body mass index is 25.34 kg/m.  Generalized: Well developed, in no acute distress  Cardiology: normal rate and rhythm, no murmur auscultated  Respiratory: clear to auscultation bilaterally    Neurological  examination  Mentation: Alert oriented to time, place, history taking. Follows all commands speech and language fluent Cranial nerve II-XII: Pupils were equal round reactive to light. Extraocular movements were full, visual field were full on confrontational test. Facial sensation and strength were normal. Head turning and shoulder shrug  were normal and symmetric. Motor: The motor testing reveals 5 over 5 strength of all 4 extremities. Good symmetric motor tone is noted throughout.  Gait and station: Gait is normal.    DIAGNOSTIC DATA (LABS, IMAGING, TESTING) - I reviewed patient records, labs, notes, testing and imaging myself where available.  Lab Results  Component Value Date   WBC 7.4 04/17/2021   HGB 15.0 04/17/2021   HCT 44.0 04/17/2021   MCV 92.5 04/17/2021   PLT 294 04/17/2021      Component Value Date/Time   NA 138 04/17/2021 0530   K 4.7 04/17/2021 0530   CL 104 04/17/2021 0530   CO2 26 04/17/2021 0523   GLUCOSE 182 (H) 04/17/2021 0530   BUN 18 04/17/2021 0530   CREATININE 0.70 04/17/2021 0530   CALCIUM  8.8 (L) 04/17/2021 0523   PROT 6.4 (L) 04/17/2021 0523   ALBUMIN 3.5 04/17/2021 0523   AST 34 04/17/2021 0523   ALT 23 04/17/2021 0523   ALKPHOS 34 (L) 04/17/2021 0523   BILITOT 0.5 04/17/2021 0523   GFRNONAA >60 04/17/2021 0523   GFRAA  10/18/2009 0350    >60        The eGFR has been calculated using the MDRD equation. This calculation has not been validated in all clinical situations. eGFR's persistently <60 mL/min signify possible Chronic Kidney Disease.   No results found for: CHOL, HDL, LDLCALC, LDLDIRECT, TRIG, CHOLHDL Lab Results  Component Value Date   HGBA1C 6.4 (H) 03/08/2020   No results found for: VITAMINB12 No results found for: TSH      No data to display  No data to display           ASSESSMENT AND PLAN  63 y.o. year old female  has a past medical history of Cancer (HCC), Carotid artery  stenosis, COPD (chronic obstructive pulmonary disease) (HCC), Diabetes mellitus without complication (HCC), Hyperlipidemia, Hypertension, Hypothyroidism, Insomnia, Migraine, Seizures (HCC) (02/04/2020), Stroke (HCC) (12/2019), and Thyroid disease. here with    Partial symptomatic epilepsy with complex partial seizures, not intractable, without status epilepticus (HCC)  Terri Li reports doing well. No recent seizure activity. She will continue levetiracetam  750mg  twice daily. Healthy lifestyle habits encouraged. She will follow up with PCP as directed. Fall precautions discussed. She will return to see me in 1 year, sooner if needed. She verbalizes understanding and agreement with this plan.   No orders of the defined types were placed in this encounter.    Meds ordered this encounter  Medications   levETIRAcetam  (KEPPRA ) 750 MG tablet    Sig: Take 1 tablet (750 mg total) by mouth 2 (two) times daily.    Dispense:  180 tablet    Refill:  4    Supervising Provider:   INES ONETHA NOVAK [[id number]]     Greig Forbes, MSN, FNP-C 05/07/2023, 1:40 PM  [facility] [address], [address] [place], [place] [address] [phone]

## 2023-05-07 ENCOUNTER — Encounter: Payer: Self-pay | Admitting: Family Medicine

## 2023-05-07 ENCOUNTER — Ambulatory Visit (INDEPENDENT_AMBULATORY_CARE_PROVIDER_SITE_OTHER): Payer: Self-pay | Admitting: Family Medicine

## 2023-05-07 VITALS — BP 138/85 | HR 65 | Ht 66.0 in | Wt 157.0 lb

## 2023-05-07 DIAGNOSIS — G40209 Localization-related (focal) (partial) symptomatic epilepsy and epileptic syndromes with complex partial seizures, not intractable, without status epilepticus: Secondary | ICD-10-CM

## 2023-05-07 MED ORDER — LEVETIRACETAM 750 MG PO TABS: 750.0000 mg | ORAL_TABLET | Freq: Two times a day (BID) | ORAL | 4 refills | Status: AC

## 2024-05-06 ENCOUNTER — Ambulatory Visit: Payer: BLUE CROSS/BLUE SHIELD | Admitting: Family Medicine

## 2024-06-01 ENCOUNTER — Ambulatory Visit: Payer: Self-pay | Admitting: Diagnostic Neuroimaging

## 2024-06-02 ENCOUNTER — Other Ambulatory Visit: Payer: Self-pay

## 2024-06-02 DIAGNOSIS — I6523 Occlusion and stenosis of bilateral carotid arteries: Secondary | ICD-10-CM

## 2024-06-29 ENCOUNTER — Ambulatory Visit

## 2024-06-29 ENCOUNTER — Ambulatory Visit (HOSPITAL_COMMUNITY)
